# Patient Record
Sex: Male | Born: 1965 | Race: White | Hispanic: No | Marital: Married | Smoking: Never smoker
Health system: Southern US, Community
[De-identification: ages and names within clinical notes are randomized; demographics above are authoritative.]

## PROBLEM LIST (undated history)

## (undated) DIAGNOSIS — T7840XA Allergy, unspecified, initial encounter: Secondary | ICD-10-CM

## (undated) DIAGNOSIS — M109 Gout, unspecified: Secondary | ICD-10-CM

## (undated) DIAGNOSIS — E669 Obesity, unspecified: Secondary | ICD-10-CM

## (undated) DIAGNOSIS — K219 Gastro-esophageal reflux disease without esophagitis: Secondary | ICD-10-CM

## (undated) DIAGNOSIS — F419 Anxiety disorder, unspecified: Secondary | ICD-10-CM

## (undated) DIAGNOSIS — E119 Type 2 diabetes mellitus without complications: Secondary | ICD-10-CM

## (undated) DIAGNOSIS — M1990 Unspecified osteoarthritis, unspecified site: Secondary | ICD-10-CM

## (undated) DIAGNOSIS — E7800 Pure hypercholesterolemia, unspecified: Secondary | ICD-10-CM

## (undated) DIAGNOSIS — J189 Pneumonia, unspecified organism: Secondary | ICD-10-CM

## (undated) DIAGNOSIS — I10 Essential (primary) hypertension: Secondary | ICD-10-CM

## (undated) HISTORY — PX: SHOULDER ARTHROSCOPY: SHX128

## (undated) HISTORY — PX: CYSTECTOMY: SUR359

## (undated) HISTORY — DX: Type 2 diabetes mellitus without complications: E11.9

## (undated) HISTORY — PX: KNEE ARTHROSCOPY: SHX127

## (undated) HISTORY — PX: TONSILLECTOMY: SUR1361

---

## 2014-05-25 ENCOUNTER — Encounter (HOSPITAL_COMMUNITY): Payer: Self-pay | Admitting: *Deleted

## 2014-05-25 ENCOUNTER — Emergency Department (HOSPITAL_COMMUNITY)
Admission: EM | Admit: 2014-05-25 | Discharge: 2014-05-25 | Disposition: A | Discharge status: Home / Self Care | Payer: Self-pay | Attending: Emergency Medicine | Admitting: Emergency Medicine

## 2014-05-25 DIAGNOSIS — Z760 Encounter for issue of repeat prescription: Secondary | ICD-10-CM

## 2014-05-25 DIAGNOSIS — I10 Essential (primary) hypertension: Secondary | ICD-10-CM | POA: Insufficient documentation

## 2014-05-25 DIAGNOSIS — E669 Obesity, unspecified: Secondary | ICD-10-CM | POA: Insufficient documentation

## 2014-05-25 DIAGNOSIS — R030 Elevated blood-pressure reading, without diagnosis of hypertension: Secondary | ICD-10-CM

## 2014-05-25 DIAGNOSIS — Z7982 Long term (current) use of aspirin: Secondary | ICD-10-CM | POA: Insufficient documentation

## 2014-05-25 DIAGNOSIS — Z79899 Other long term (current) drug therapy: Secondary | ICD-10-CM | POA: Insufficient documentation

## 2014-05-25 DIAGNOSIS — IMO0001 Reserved for inherently not codable concepts without codable children: Secondary | ICD-10-CM

## 2014-05-25 HISTORY — DX: Pure hypercholesterolemia, unspecified: E78.00

## 2014-05-25 HISTORY — DX: Essential (primary) hypertension: I10

## 2014-05-25 HISTORY — DX: Obesity, unspecified: E66.9

## 2014-05-25 MED ORDER — LISINOPRIL 20 MG PO TABS: 20.0000 mg | ORAL_TABLET | Freq: Every day | ORAL | Status: DC

## 2014-05-25 MED ORDER — HYDROCHLOROTHIAZIDE 25 MG PO TABS: 25.0000 mg | ORAL_TABLET | Freq: Every day | ORAL | Status: DC

## 2014-05-25 NOTE — ED Notes (Signed)
Pt. Denies any dizziness or chest pain.  Pt. Denies any sob or n/v/d

## 2014-05-25 NOTE — ED Notes (Signed)
Pt reports recently moving here, does not have pcp. Has been out of bp meds x 3 weeks, checked bp at the store today and it was elevated. No acute distress noted at triage.

## 2014-05-25 NOTE — Discharge Instructions (Signed)
[facility].   Hypertension Hypertension, commonly called high blood pressure, is when the force of blood pumping through your arteries is too strong. Your arteries are the blood vessels that carry blood from your heart throughout your body. A blood pressure reading consists of a higher number over a lower number, such as 110/72. The higher number (systolic) is the pressure inside your arteries when your heart pumps. The lower number (diastolic) is the pressure inside your arteries when your heart relaxes. Ideally you want your blood pressure below 120/80. Hypertension forces your heart to work harder to pump blood. Your arteries may become narrow or stiff. Having hypertension puts you at risk for heart disease, stroke, and other problems.  RISK FACTORS Some risk factors for high blood pressure are controllable. Others are not.  Risk factors you cannot control include:   Race. You may be at higher risk if you are African American.  Age. Risk increases with age.  Gender. Men are at higher risk than women before age 50 years. After age 50, women are at higher risk than men. Risk factors you can control include:  Not getting enough exercise or physical activity.  Being overweight.  Getting too much fat, sugar, calories, or salt in your diet.  Drinking too much alcohol. SIGNS AND SYMPTOMS Hypertension does not usually cause signs or symptoms. Extremely high blood pressure (hypertensive crisis) may cause headache, anxiety, shortness of breath, and nosebleed. DIAGNOSIS  To check if you have hypertension, your health care provider will measure your blood pressure while you are seated, with your arm held at the level of your heart. It should be measured at least twice using the same arm. Certain conditions can cause a difference in blood pressure between your right and left arms. A blood pressure reading that is higher than normal on one occasion does not mean  that you need treatment. If one blood pressure reading is high, ask your health care provider about having it checked again. TREATMENT  Treating high blood pressure includes making lifestyle changes and possibly taking medicine. Living a healthy lifestyle can help lower high blood pressure. You may need to change some of your habits. Lifestyle changes may include:  Following the DASH diet. This diet is high in fruits, vegetables, and whole grains. It is low in salt, red meat, and added sugars.  Getting at least 2 hours of brisk physical activity every week.  Losing weight if necessary.  Not smoking.  Limiting alcoholic beverages.  Learning ways to reduce stress. If lifestyle changes are not enough to get your blood pressure under control, your health care provider may prescribe medicine. You may need to take more than one. Work closely with your health care provider to understand the risks and benefits. HOME CARE INSTRUCTIONS  Have your blood pressure rechecked as directed by your health care provider.   Take medicines only as directed by your health care provider. Follow the directions carefully. Blood pressure medicines must be taken as prescribed. The medicine does not work as well when you skip doses. Skipping doses also puts you at risk for problems.   Do not smoke.   Monitor your blood pressure at home as directed by your health care provider. SEEK MEDICAL CARE IF:   You think you are having a reaction to medicines taken.  You have recurrent headaches or feel dizzy.  You have swelling in your ankles.  You have trouble with your vision. SEEK IMMEDIATE  MEDICAL CARE IF:  You develop a severe headache or confusion.  You have unusual weakness, numbness, or feel faint.  You have severe chest or abdominal pain.  You vomit repeatedly.  You have trouble breathing. MAKE SURE YOU:   Understand these instructions.  Will watch your condition.  Will get help right  away if you are not doing well or get worse. Document Released: 01/04/2005 Document Revised: 05/21/2013 Document Reviewed: 10/27/2012 [facility] Patient Information 2015 [place]. This information is not intended to replace advice given to you by your health care provider. Make sure you discuss any questions you have with your health care provider.

## 2014-05-25 NOTE — ED Provider Notes (Signed)
CSN: [id number]     Arrival date & time 05/25/14  1628 History   First MD Initiated Contact with Patient 05/25/14 1813     Chief Complaint  Patient presents with  . Hypertension     (Consider location/radiation/quality/duration/timing/severity/associated sxs/prior Treatment) HPI Christopher Hurst is a 49 y.o. male who presents to the ED for BP check. He reports being out of his medication x 3 weeks and taking it today at home it was elevated. Patient states that he does not have insurance and has not been able to get in with a PCP to get him medications. He was taking HCTC 25 mg and Lisinopril 20 mg. He sates that he feels well just concerned because his BP today was 160/109.   Past Medical History  Diagnosis Date  . Hypertension   . Obesity   . High cholesterol    History reviewed. No pertinent past surgical history. History reviewed. No pertinent family history. History  Substance Use Topics  . Smoking status: Never Smoker   . Smokeless tobacco: Not on file  . Alcohol Use: No    Review of Systems Negative except as sated in HPI   Allergies  Review of patient's allergies indicates no known allergies.  Home Medications   Prior to Admission medications   Medication Sig Start Date End Date Taking? Authorizing Provider  aspirin 81 MG chewable tablet Chew 81 mg by mouth daily.   Yes Historical Provider, MD  Flaxseed, Linseed, (FLAXSEED OIL PO) Take 1 tablet by mouth daily.   Yes Historical Provider, MD  Multiple Vitamin (MULTIVITAMIN WITH MINERALS) TABS tablet Take 1 tablet by mouth daily.   Yes Historical Provider, MD  hydrochlorothiazide (HYDRODIURIL) 25 MG tablet Take 1 tablet (25 mg total) by mouth daily. 05/25/14   Hope Orlene OchM Neese, NP  lisinopril (PRINIVIL,ZESTRIL) 20 MG tablet Take 1 tablet (20 mg total) by mouth daily. 05/25/14   Hope Orlene OchM Neese, NP   BP 169/85 mmHg  Pulse 80  Temp(Src) 98.2 F (36.8 C) (Oral)  Resp 20  Ht 5\' 8"  (1.727 m)  Wt 264 lb 8 oz (119.976 kg)  BMI  40.23 kg/m2  SpO2 99% Physical Exam  Constitutional: He is oriented to person, place, and time. He appears well-developed and well-nourished. No distress.  HENT:  Head: Normocephalic and atraumatic.  Mouth/Throat: Uvula is midline, oropharynx is clear and moist and mucous membranes are normal.  Eyes: Conjunctivae and EOM are normal.  Neck: Normal range of motion. Neck supple.  Cardiovascular: Normal rate and regular rhythm.   Pulmonary/Chest: Effort normal. No respiratory distress. He has no wheezes. He has no rales.  Abdominal: Soft. There is no tenderness.  Musculoskeletal: Normal range of motion.  Neurological: He is alert and oriented to person, place, and time. He has normal strength. No cranial nerve deficit or sensory deficit. Coordination and gait normal.  No asymmetry.    Skin: Skin is warm and dry.  Psychiatric: He has a normal mood and affect. His behavior is normal.  Nursing note and vitals reviewed.   ED Course  Procedures (including critical care time) Labs Review Discussed with DR. Cook and will refill patient's medications and [facility]  MDM  49 y.o. male with elevated BP since being off his medication x 3 weeks. Stable for d/c with BP 169/85 mmHg  Pulse 80  Temp(Src) 98.2 F (36.8 C) (Oral)  Resp 20  Ht 5\' 8"  (1.727 m)  Wt 264 lb  8 oz (119.976 kg)  BMI 40.23 kg/m2  SpO2 99%  Discussed with the patient plan of care and all questioned fully answered. He will return if any problems arise.   Final diagnoses:  Medication refill  Elevated BP       Janne NapoleonHope M Neese, NP 05/25/14 1851  Donnetta HutchingBrian Cook, MD 05/26/14 [phone]

## 2014-07-05 ENCOUNTER — Encounter (HOSPITAL_COMMUNITY): Payer: Self-pay | Admitting: Family Medicine

## 2014-07-05 ENCOUNTER — Emergency Department (HOSPITAL_COMMUNITY)
Admission: EM | Admit: 2014-07-05 | Discharge: 2014-07-05 | Disposition: A | Discharge status: Home / Self Care | Payer: Self-pay | Attending: Emergency Medicine | Admitting: Emergency Medicine

## 2014-07-05 DIAGNOSIS — M79674 Pain in right toe(s): Secondary | ICD-10-CM | POA: Insufficient documentation

## 2014-07-05 DIAGNOSIS — Z79899 Other long term (current) drug therapy: Secondary | ICD-10-CM | POA: Insufficient documentation

## 2014-07-05 DIAGNOSIS — M10071 Idiopathic gout, right ankle and foot: Secondary | ICD-10-CM | POA: Insufficient documentation

## 2014-07-05 DIAGNOSIS — I10 Essential (primary) hypertension: Secondary | ICD-10-CM | POA: Insufficient documentation

## 2014-07-05 DIAGNOSIS — E669 Obesity, unspecified: Secondary | ICD-10-CM | POA: Insufficient documentation

## 2014-07-05 DIAGNOSIS — Z7982 Long term (current) use of aspirin: Secondary | ICD-10-CM | POA: Insufficient documentation

## 2014-07-05 HISTORY — DX: Gout, unspecified: M10.9

## 2014-07-05 MED ORDER — LISINOPRIL 20 MG PO TABS: 20.0000 mg | ORAL_TABLET | Freq: Every day | ORAL | Status: DC

## 2014-07-05 MED ORDER — INDOMETHACIN 25 MG PO CAPS
50.0000 mg | ORAL_CAPSULE | Freq: Once | ORAL | Status: AC
  Administered 2014-07-05: 50 mg via ORAL
  Filled 2014-07-05: qty 2

## 2014-07-05 MED ORDER — INDOMETHACIN 50 MG PO CAPS: ORAL_CAPSULE | ORAL | Status: DC

## 2014-07-05 NOTE — ED Provider Notes (Signed)
CSN: [id number]     Arrival date & time 07/05/14  1557 History  This chart was scribed for non-physician practitioner Oswaldo Conroy, PA-C working with Lorre Nick, MD by Lyndel Safe, ED Scribe. This patient was seen in room TR10C/TR10C and the patient's care was started at 4:37 PM.   Chief Complaint  Patient presents with  . Gout   The history is provided by the patient. No language interpreter was used.   HPI Comments: Christopher Hurst is a 49 y.o. male, with a PMhx of gout, HTN, obesity, and HLD, who presents to the Emergency Department complaining of sudden onset, constant, severe, left-sided inner foot pain that began 1 day ago. He notes a history of gout which he is prescribed indomethacin for but has since ran out of the medication. He notes that he went out to eat 3 nights ago and had mushrooms which he attributes his gout flare up to. He has a history of gout in his right foot. Pt denies a history of injury/trauma to his left foot, fevers, chills, numbness or weakness in lower extremities.   Pt notes he does not have insurance and is requesting a refill on his BP medicine. BP is 183/86 mgHg currently.   Past Medical History  Diagnosis Date  . Hypertension   . Obesity   . High cholesterol   . Gout    History reviewed. No pertinent past surgical history. History reviewed. No pertinent family history. History  Substance Use Topics  . Smoking status: Never Smoker   . Smokeless tobacco: Not on file  . Alcohol Use: No    Review of Systems  Constitutional: Negative for fever and chills.  Musculoskeletal: Positive for myalgias and arthralgias.  Neurological: Negative for weakness and numbness.      Allergies  Review of patient's allergies indicates no known allergies.  Home Medications   Prior to Admission medications   Medication Sig Start Date End Date Taking? Authorizing Provider  aspirin 81 MG chewable tablet Chew 81 mg by mouth daily.    Historical Provider, MD   Flaxseed, Linseed, (FLAXSEED OIL PO) Take 1 tablet by mouth daily.    Historical Provider, MD  hydrochlorothiazide (HYDRODIURIL) 25 MG tablet Take 1 tablet (25 mg total) by mouth daily. 05/25/14   Hope Orlene Och, NP  indomethacin (INDOCIN) 50 MG capsule Oral 50 mg 3 times daily until pain is tolerable then rapidly reduce dose to complete cessation of drug. 07/05/14   Oswaldo Conroy, PA-C  lisinopril (PRINIVIL,ZESTRIL) 20 MG tablet Take 1 tablet (20 mg total) by mouth daily. 05/25/14   Hope Orlene Och, NP  lisinopril (PRINIVIL,ZESTRIL) 20 MG tablet Take 1 tablet (20 mg total) by mouth daily. 07/05/14   Oswaldo Conroy, PA-C  Multiple Vitamin (MULTIVITAMIN WITH MINERALS) TABS tablet Take 1 tablet by mouth daily.    Historical Provider, MD   BP 183/86 mmHg  Pulse 86  Temp(Src) 98.1 F (36.7 C) (Oral)  Resp 18  SpO2 96% Physical Exam  Constitutional: He appears well-developed and well-nourished. No distress.  HENT:  Head: Normocephalic and atraumatic.  Eyes: Conjunctivae are normal. Right eye exhibits no discharge. Left eye exhibits no discharge.  Cardiovascular:  2+ DP and TP pulses on left foot.   Pulmonary/Chest: Effort normal. No respiratory distress.  Musculoskeletal:  Tenderness to left MTP, first without significant swelling or erythema. FROM. Pt ambulatory  Neurological: He is alert. Coordination normal.  5/5 strength in left lower extremity.  Sensation intact.   Skin: He is  not diaphoretic.  Psychiatric: He has a normal mood and affect. His behavior is normal.  Nursing note and vitals reviewed.   ED Course  Procedures  DIAGNOSTIC STUDIES: Oxygen Saturation is 96% on RA, adequate by my interpretation.    COORDINATION OF CARE: 4:43 PM Discussed treatment plan with pt. Pt acknowledges and agrees to plan.   Labs Review Labs Reviewed - No data to display  Imaging Review No results found.   EKG Interpretation None      MDM   Final diagnoses:  Great toe pain, right   Pt  with history of gout presenting with atraumatic toe pain. VSS. Neurovascularly intact. Tenderness to MTP without significant swelling erythema. FROM. I doubt septic arthritis. Pt likely with gout. Will treat with indomethacin. Refilled pt lisinopril. Stressed the importance of follow up with the wellness center. ED resources provided to establish care with PCP.   Discussed return precautions with patient. Patient verbalizes understanding and agrees with plan.  I personally performed the services described in this documentation, which was scribed in my presence. The recorded information has been reviewed and is accurate.   Oswaldo Conroy, PA-C 07/05/14 1654  Lorre Nick, MD 07/06/14 2129

## 2014-07-05 NOTE — ED Notes (Signed)
Pt here for gout in left foot/toes. sts hx of gout in same toes.

## 2014-07-05 NOTE — Discharge Instructions (Signed)
Return to the emergency room with worsening of symptoms, new symptoms or with symptoms that are concerning, especially fevers, redness, swelling, numbness, tingling, unable to move toes or walk. RICE: Rest, Ice (three cycles of 20 mins on, off at least twice a day), compression/brace, elevation.  Indomethacin as above. Please call your doctor for a followup appointment within 24-48 hours. When you talk to your doctor please let them know that you were seen in the emergency department and have them acquire all of your records so that they can discuss the findings with you and formulate a treatment plan to fully care for your new and ongoing problems.   Emergency Department Resource Guide 1) Find a Doctor and Pay Out of Pocket Although you won't have to find out who is covered by your insurance plan, it is a good idea to ask around and get recommendations. You will then need to call the office and see if the doctor you have chosen will accept you as a new patient and what types of options they offer for patients who are self-pay. Some doctors offer discounts or will set up payment plans for their patients who do not have insurance, but you will need to ask so you aren't surprised when you get to your appointment.  2) Contact Your Local Health Department Not all health departments have doctors that can see patients for sick visits, but many do, so it is worth a call to see if yours does. If you don't know where your local health department is, you can check in your phone book. The CDC also has a tool to help you locate your state's health department, and many state websites also have listings of all of their local health departments.  3) Find a Walk-in Clinic If your illness is not likely to be very severe or complicated, you may want to try a walk in clinic. These are popping up all over the country in pharmacies, drugstores, and shopping centers. They're usually staffed by nurse practitioners or  physician assistants that have been trained to treat common illnesses and complaints. They're usually fairly quick and inexpensive. However, if you have serious medical issues or chronic medical problems, these are probably not your best option.  No Primary Care Doctor: - Call Health Connect at  [phone] - they can help you locate a primary care doctor that  accepts your insurance, provides certain services, etc. - Physician Referral Service- [phone]  Chronic Pain Problems: Organization         Address  Phone   Notes  [facility]  [phone] Patients need to be referred by their primary care doctor.   Medication Assistance: Organization         Address  Phone   Notes  [facility] Medication [facility] [address] [place], [place] [address] [phone] --Must be a resident of [facility] -- Must have NO insurance coverage whatsoever (no Medicaid/ Medicare, etc.) -- The pt. MUST have a primary care doctor that directs their care regularly and follows them in the community   [facility]  [phone]   [employer]  [phone]    Agencies that provide inexpensive medical care: Organization         Address  Phone   Notes  [facility]  [phone]   [facility]    [phone]   [facility] [address] [place], [place]  [phone]   [facility] [address], [place] [phone]   Planned Parenthood    [phone]   [facility]    [phone]   [facility]  [address], [place] Phone:  [phone], Fax:  [phone] Hours of Operation:  9 am - 6 pm, M-F.  Also accepts Medicaid/Medicare and self-pay.  [facility]  [address], [place] Phone: [phone], Fax: [phone]. Hours of Operation:  8:30 am - 5:30 pm, M-F.   Also accepts Medicaid and self-pay.  [facility] [place] [address] [place] Phone: [phone]   [facility] [address] Natasha Bence [place], [place] [phone], Ext. [phone] Mondays & Thursdays: 7-9 AM.  First 15 patients are seen on a first come, first serve basis.    Medicaid-accepting [facility] Providers:  Organization         Address  Phone   Notes  [facility] [address], [place] [phone] Also accepts self-pay patients.  [facility] [address] Laurell Josephs [place], [place]  [phone]   [facility] [address], [place] [phone]   [facility] [address] [place] [phone]   Renaye Rakers [address], [place]   [phone] Only accepts [place] Access [place] patients after they have their name applied to their card.   Self-Pay (no insurance) in [facility]:  Organization         Address  Phone   Notes  Sickle Cell Patients, [facility] Internal Medicine [address] [place], [place] [phone]   [facility] [address] [place], [place] [phone]   [facility] [place]  [address], [place] [phone]   [facility]/Dr. Osei-Bonsu  [address], [place] or [address], [place] [phone] Phone number for both [place] and [place] locations is the same.  [facility] [address] [place] [phone]   [facility] [address] [place] or [address] [phone]   [facility] [address], [place] [phone], phone; [phone], fax Sees patients 1st and 3rd Saturday of every month.  Must not qualify for public or private insurance (i.e. Medicaid, Medicare, [place] Health Choice, Veterans'  Benefits)  Household income should be no more than 200% of the poverty level The clinic cannot treat you if you are pregnant or think you are pregnant  Sexually transmitted diseases are not treated at the clinic.    Dental Care: Organization         Address  Phone  Notes  [facility] Department of [facility] [address] [place], [place] [phone] Accepts children up to age 7 who are enrolled in [place] or [place] Health Choice; pregnant women with a Medicaid card; and children who have applied for Medicaid or [place] Health Choice, but were declined, whose parents can pay a reduced fee at time of service.  [facility] of [facility]  [address], [place] [phone] Accepts children up to age 59 who are enrolled in [place] or [place] Health Choice; pregnant women  with a Medicaid card; and children who have applied for Medicaid or [place] Health Choice, but were declined, whose parents can pay a reduced fee at time of service.  [facility]  [address], [place] [phone] Patients are seen by appointment only. Walk-ins are not accepted. [facility] will see patients 50 years of age and older. Monday - Tuesday (8am-5pm) Most Wednesdays (8:30-5pm) $30 per visit, cash only  [facility]  [address], [facility] [phone] Patients are seen by appointment only. Walk-ins are not accepted. [facility] will see patients 50 years of age and older. One Wednesday Evening (Monthly: Volunteer Based).  $30 per visit, cash only  [facility]  [phone] for adults; Children under age 684, call Graduate Pediatric Dentistry at [phone]. Children aged 50-14, please call [phone] to request a pediatric application.  Dental services are provided in all areas of dental care including fillings, crowns and bridges, complete and partial  dentures, implants, gum treatment, root canals, and extractions. Preventive care is also provided. Treatment is provided to both adults and children. Patients are selected via a lottery and there is often a waiting list.   [facility] [address], [place]  [phone] [email or web address]   [facility] [address], [place], [place] [phone], Ext. [phone] Second and Fourth Thursday of each month, opens at 6:30 AM; Clinic ends at 9 AM.  Patients are seen on a first-come first-served basis, and a limited number are seen during each clinic.   [facility]  [address], [place], [place] [phone]   Eligibility Requirements You must have lived in [place], [place], or [place] counties for at least the last three months.   You cannot be eligible for state or federal sponsored [place] Cityhealthcare insurance, including CIG[employer]eterans Administration, [place]icaid, or [employer]icare.   You generally cannot be eligible for healthcare insurance through your employer.    How to apply: Eligibility screenings are held every Tuesday and Wednesday afternoon from 1:00 pm until 4:00 pm. You do not need an appointment for the interview!  [facility] [address], [place], [place] [phone]   [facility]  [phone]   [facility]  [phone]   [facility]  [phone]    Behavioral Health Resources in the Community: Intensive Outpatient Programs Organization         Address  Phone  Notes  [facility] [address], [place], [place] [phone]   [facility] [address], [place], [place] [phone]   [facility] [address], [place], [place]  [phone]   [facility] [address],  [place], [place] [phone] or [phone]   Substance Abuse Resources Organization          Address  Phone  Notes  [facility]  [phone]   [facility]  [phone]   [facility]  [phone]   [facility]  [phone]   Residential & Outpatient Substance Abuse Program  [phone]   Psychological Services Organization         Address  Phone  Notes  [facility]  [phone]- [phone]   [facility]  [phone]- [phone]   [facility] [address], [place] [phone] or [phone]    Mobile Crisis Teams  Organization         Address  Phone  Notes  [facility], Mobile Crisis Care Unit  [phone]   [facility]  [address] [place], [place] [phone]   [facility] [address] [place] [phone]    Self-Help/Support Groups Organization         Address  Phone             Notes  Mental Health Assoc. of  - variety of support groups  336- I7437963 Call for more information  Narcotics Anonymous (NA), [facility] [address], [employer] [place]  2 meetings at this location   Statistician         Address  Phone  Notes  [facility] [address],    [place]  [phone]   [facility]  [address], [place] [address], [place], [place] [phone]   [facility] [address] [place], [place] [phone] Admissions: 8am-3pm M-F  [facility] [address],    [place], [place] [phone]   [facility] [address] [place], [place], [place] [phone]   [facility] [address],  [place], [place] [phone]   [facility] - Intensive Outpatient [address]ure[address], [place], [place] [phone]   [facility] ([facility]iction [facility] [address] [place]  [place], [place] [phone] or [phone]   [facility] ([facility]) [address], [place], [place]  [phone] Accepts Medicaid  [facility] [address]  [place] [phone] Substance Abuse/Addiction Treatment   [facility] Organization         Address  Phone  Notes  [facility]  [phone]   Angie Fava, PhD [address] Ervin Knack [place], [place]   [phone] or [phone]   [facility]   [address] [place], [place] [phone]   [facility] [address] [place], [place] [phone] Insurance/Medicaid/sponsorship through [facility] and Families [address]                                    [place], [place] [phone] Therapy/tele-psych/case  [facility] [address], [place] [phone]    Dr. Lolly Mustache  [phone]   [facility]  [employer] [facility] Dept. 1) [address] [place] 2) [address] [place] 3)  [address], [place] [phone]  [phone]   [facility] Child Abuse Hotline [phone] or [phone] (After Hours)

## 2014-07-07 ENCOUNTER — Emergency Department (HOSPITAL_COMMUNITY)
Admission: EM | Admit: 2014-07-07 | Discharge: 2014-07-07 | Disposition: A | Discharge status: Home / Self Care | Payer: Self-pay | Attending: Emergency Medicine | Admitting: Emergency Medicine

## 2014-07-07 ENCOUNTER — Encounter (HOSPITAL_COMMUNITY): Payer: Self-pay | Admitting: Nurse Practitioner

## 2014-07-07 DIAGNOSIS — Z79899 Other long term (current) drug therapy: Secondary | ICD-10-CM | POA: Insufficient documentation

## 2014-07-07 DIAGNOSIS — E669 Obesity, unspecified: Secondary | ICD-10-CM | POA: Insufficient documentation

## 2014-07-07 DIAGNOSIS — I10 Essential (primary) hypertension: Secondary | ICD-10-CM | POA: Insufficient documentation

## 2014-07-07 DIAGNOSIS — M10072 Idiopathic gout, left ankle and foot: Secondary | ICD-10-CM | POA: Insufficient documentation

## 2014-07-07 DIAGNOSIS — Z7982 Long term (current) use of aspirin: Secondary | ICD-10-CM | POA: Insufficient documentation

## 2014-07-07 MED ORDER — PREDNISONE 20 MG PO TABS
60.0000 mg | ORAL_TABLET | Freq: Every day | ORAL | Status: DC
  Administered 2014-07-07: 60 mg via ORAL
  Filled 2014-07-07: qty 3

## 2014-07-07 MED ORDER — HYDROCODONE-ACETAMINOPHEN 5-325 MG PO TABS: 2.0000 | ORAL_TABLET | ORAL | Status: DC | PRN

## 2014-07-07 MED ORDER — PREDNISONE 10 MG PO TABS: ORAL_TABLET | ORAL | Status: DC

## 2014-07-07 MED ORDER — LISINOPRIL 20 MG PO TABS
20.0000 mg | ORAL_TABLET | Freq: Once | ORAL | Status: AC
  Administered 2014-07-07: 20 mg via ORAL
  Filled 2014-07-07: qty 1

## 2014-07-07 MED ORDER — HYDROCODONE-ACETAMINOPHEN 5-325 MG PO TABS
2.0000 | ORAL_TABLET | Freq: Once | ORAL | Status: AC
  Administered 2014-07-07: 2 via ORAL
  Filled 2014-07-07: qty 2

## 2014-07-07 NOTE — ED Notes (Signed)
He reports he was here earlier this week and dx with gout, he was started on indomethacin but he continues to have pain. Pain is in L inner foot, states he can not even wear a sock or shoe due to pain.

## 2014-07-07 NOTE — ED Provider Notes (Signed)
CSN: [id number]     Arrival date & time 07/07/14  1155 History  This chart was scribed for Christopher Schaumann, PA-C, working with Azalia Bilis, MD by Chestine Spore, ED Scribe. The patient was seen in room TR11C/TR11C at 2:23 PM.    Chief Complaint  Patient presents with  . Foot Pain      The history is provided by the patient. No language interpreter was used.    HPI Comments: Christopher Hurst is a 49 y.o. male with a medical hx of gout and HTN who presents to the Emergency Department complaining of left foot pain onset earlier this week. He reports that this is his first flare up in 1 year. He is not able to put on a sock or shoe because of the pain. He thinks that eating mushrooms flared up his gout. He has now tried to change his diet in order to aid with the flare ups. Pt notes that he was Rx his HTN medications that he hasn't filled yet because he lost his debit card. He denies joint swelling, redness, and any other symptoms. He denies being a diabetic.   Past Medical History  Diagnosis Date  . Hypertension   . Obesity   . High cholesterol   . Gout    History reviewed. No pertinent past surgical history. History reviewed. No pertinent family history. History  Substance Use Topics  . Smoking status: Never Smoker   . Smokeless tobacco: Not on file  . Alcohol Use: No    Review of Systems  Musculoskeletal: Positive for arthralgias (right great toe). Negative for joint swelling.  Skin: Negative for color change.      Allergies  Review of patient's allergies indicates no known allergies.  Home Medications   Prior to Admission medications   Medication Sig Start Date End Date Taking? Authorizing Provider  aspirin 81 MG chewable tablet Chew 81 mg by mouth daily.    Historical Provider, MD  Flaxseed, Linseed, (FLAXSEED OIL PO) Take 1 tablet by mouth daily.    Historical Provider, MD  hydrochlorothiazide (HYDRODIURIL) 25 MG tablet Take 1 tablet (25 mg total) by mouth daily. 05/25/14    Hope Orlene Och, NP  indomethacin (INDOCIN) 50 MG capsule Oral 50 mg 3 times daily until pain is tolerable then rapidly reduce dose to complete cessation of drug. 07/05/14   Oswaldo Conroy, PA-C  lisinopril (PRINIVIL,ZESTRIL) 20 MG tablet Take 1 tablet (20 mg total) by mouth daily. 05/25/14   Hope Orlene Och, NP  lisinopril (PRINIVIL,ZESTRIL) 20 MG tablet Take 1 tablet (20 mg total) by mouth daily. 07/05/14   Oswaldo Conroy, PA-C  Multiple Vitamin (MULTIVITAMIN WITH MINERALS) TABS tablet Take 1 tablet by mouth daily.    Historical Provider, MD   BP 173/104 mmHg  Pulse 81  Temp(Src) 98.2 F (36.8 C) (Oral)  Resp 18  SpO2 98% Physical Exam  Constitutional: He is oriented to person, place, and time. He appears well-developed and well-nourished. No distress.  HENT:  Head: Normocephalic and atraumatic.  Eyes: EOM are normal.  Neck: Neck supple. No tracheal deviation present.  Cardiovascular: Normal rate.   Pulmonary/Chest: Effort normal. No respiratory distress.  Musculoskeletal: Normal range of motion.  Swelling 1st metatarsals. Good pulses.   Neurological: He is alert and oriented to person, place, and time.  Skin: Skin is warm and dry.  Psychiatric: He has a normal mood and affect. His behavior is normal.  Nursing note and vitals reviewed.   ED Course  Procedures (including  critical care time) DIAGNOSTIC STUDIES: Oxygen Saturation is 98% on RA, nl by my interpretation.    COORDINATION OF CARE: 2:27 PM-Discussed treatment plan which includes prednisone, pain medication with pt at bedside and pt agreed to plan.   Labs Review Labs Reviewed - No data to display  Imaging Review No results found.   EKG Interpretation None      MDM pt given hydrocodone, lisinopril and prednisone here.  He can not get his rx filled today   Final diagnoses:  Acute idiopathic gout of left foot    Meds ordered this encounter  Medications  . predniSONE (DELTASONE) tablet 60 mg    Sig:   .  HYDROcodone-acetaminophen (NORCO/VICODIN) 5-325 MG per tablet 2 tablet    Sig:   . lisinopril (PRINIVIL,ZESTRIL) tablet 20 mg    Sig:   . predniSONE (DELTASONE) 10 MG tablet    Sig: 5,4,3,2,1 taper    Dispense:  15 tablet    Refill:  0    Order Specific Question:  Supervising Provider    Answer:  MILLER, BRIAN [[id number]]  . HYDROcodone-acetaminophen (NORCO/VICODIN) 5-325 MG per tablet    Sig: Take 2 tablets by mouth every 4 (four) hours as needed.    Dispense:  16 tablet    Refill:  0    Order Specific Question:  Supervising Provider    Answer:  Eber Hong [[id number]]   I personally performed the services in this documentation, which was scribed in my presence.  The recorded information has been reviewed and considered.   Barnet Pall.   Lonia Skinner [place], PA-C 07/07/14 1656  Azalia Bilis, MD 07/07/14 [phone]

## 2014-07-07 NOTE — Discharge Instructions (Signed)

## 2014-07-07 NOTE — ED Notes (Signed)
Declined W/C at D/C and was escorted to lobby by RN. 

## 2014-07-16 ENCOUNTER — Emergency Department (HOSPITAL_COMMUNITY): Admission: EM | Admit: 2014-07-16 | Discharge: 2014-07-16 | Discharge status: Against Medical Advice | Payer: Self-pay

## 2014-08-09 ENCOUNTER — Emergency Department (HOSPITAL_COMMUNITY)
Admission: EM | Admit: 2014-08-09 | Discharge: 2014-08-09 | Disposition: A | Discharge status: Home / Self Care | Payer: No Typology Code available for payment source | Attending: Emergency Medicine | Admitting: Emergency Medicine

## 2014-08-09 ENCOUNTER — Encounter (HOSPITAL_COMMUNITY): Payer: Self-pay | Admitting: *Deleted

## 2014-08-09 ENCOUNTER — Emergency Department (HOSPITAL_COMMUNITY): Payer: No Typology Code available for payment source

## 2014-08-09 DIAGNOSIS — Z79899 Other long term (current) drug therapy: Secondary | ICD-10-CM | POA: Insufficient documentation

## 2014-08-09 DIAGNOSIS — Y9389 Activity, other specified: Secondary | ICD-10-CM | POA: Insufficient documentation

## 2014-08-09 DIAGNOSIS — Z7952 Long term (current) use of systemic steroids: Secondary | ICD-10-CM | POA: Diagnosis not present

## 2014-08-09 DIAGNOSIS — S8002XA Contusion of left knee, initial encounter: Secondary | ICD-10-CM | POA: Insufficient documentation

## 2014-08-09 DIAGNOSIS — S7002XA Contusion of left hip, initial encounter: Secondary | ICD-10-CM | POA: Diagnosis not present

## 2014-08-09 DIAGNOSIS — S80212A Abrasion, left knee, initial encounter: Secondary | ICD-10-CM | POA: Diagnosis not present

## 2014-08-09 DIAGNOSIS — I10 Essential (primary) hypertension: Secondary | ICD-10-CM | POA: Insufficient documentation

## 2014-08-09 DIAGNOSIS — Y998 Other external cause status: Secondary | ICD-10-CM | POA: Insufficient documentation

## 2014-08-09 DIAGNOSIS — H109 Unspecified conjunctivitis: Secondary | ICD-10-CM | POA: Diagnosis not present

## 2014-08-09 DIAGNOSIS — Z7982 Long term (current) use of aspirin: Secondary | ICD-10-CM | POA: Diagnosis not present

## 2014-08-09 DIAGNOSIS — S299XXA Unspecified injury of thorax, initial encounter: Secondary | ICD-10-CM | POA: Diagnosis not present

## 2014-08-09 DIAGNOSIS — Y9289 Other specified places as the place of occurrence of the external cause: Secondary | ICD-10-CM | POA: Diagnosis not present

## 2014-08-09 DIAGNOSIS — M109 Gout, unspecified: Secondary | ICD-10-CM | POA: Insufficient documentation

## 2014-08-09 DIAGNOSIS — S8992XA Unspecified injury of left lower leg, initial encounter: Secondary | ICD-10-CM | POA: Diagnosis present

## 2014-08-09 DIAGNOSIS — E669 Obesity, unspecified: Secondary | ICD-10-CM | POA: Insufficient documentation

## 2014-08-09 DIAGNOSIS — W19XXXA Unspecified fall, initial encounter: Secondary | ICD-10-CM

## 2014-08-09 MED ORDER — NAPROXEN 500 MG PO TABS: 500.0000 mg | ORAL_TABLET | Freq: Two times a day (BID) | ORAL | Status: DC

## 2014-08-09 MED ORDER — TOBRAMYCIN 0.3 % OP SOLN: 1.0000 [drp] | OPHTHALMIC | Status: DC

## 2014-08-09 MED ORDER — METHOCARBAMOL 500 MG PO TABS: 500.0000 mg | ORAL_TABLET | Freq: Two times a day (BID) | ORAL | Status: DC
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
## 2014-09-23 ENCOUNTER — Emergency Department (HOSPITAL_COMMUNITY)
Admission: EM | Admit: 2014-09-23 | Discharge: 2014-09-23 | Disposition: A | Discharge status: Home / Self Care | Payer: Self-pay | Attending: Emergency Medicine | Admitting: Emergency Medicine

## 2014-09-23 ENCOUNTER — Encounter (HOSPITAL_COMMUNITY): Payer: Self-pay | Admitting: Emergency Medicine

## 2014-09-23 DIAGNOSIS — Z79899 Other long term (current) drug therapy: Secondary | ICD-10-CM | POA: Insufficient documentation

## 2014-09-23 DIAGNOSIS — M109 Gout, unspecified: Secondary | ICD-10-CM | POA: Insufficient documentation

## 2014-09-23 DIAGNOSIS — Z791 Long term (current) use of non-steroidal anti-inflammatories (NSAID): Secondary | ICD-10-CM | POA: Insufficient documentation

## 2014-09-23 DIAGNOSIS — E119 Type 2 diabetes mellitus without complications: Secondary | ICD-10-CM | POA: Insufficient documentation

## 2014-09-23 DIAGNOSIS — Z7982 Long term (current) use of aspirin: Secondary | ICD-10-CM | POA: Insufficient documentation

## 2014-09-23 DIAGNOSIS — I10 Essential (primary) hypertension: Secondary | ICD-10-CM | POA: Insufficient documentation

## 2014-09-23 DIAGNOSIS — E780 Pure hypercholesterolemia: Secondary | ICD-10-CM | POA: Insufficient documentation

## 2014-09-23 MED ORDER — HYDROCHLOROTHIAZIDE 12.5 MG PO CAPS
25.0000 mg | ORAL_CAPSULE | Freq: Once | ORAL | Status: AC
  Administered 2014-09-23: 25 mg via ORAL
  Filled 2014-09-23: qty 2

## 2014-09-23 MED ORDER — LISINOPRIL 20 MG PO TABS
20.0000 mg | ORAL_TABLET | Freq: Once | ORAL | Status: AC
  Administered 2014-09-23: 20 mg via ORAL
  Filled 2014-09-23: qty 1

## 2014-09-23 MED ORDER — HYDROCHLOROTHIAZIDE 25 MG PO TABS: 25.0000 mg | ORAL_TABLET | Freq: Every day | ORAL | Status: DC

## 2014-09-23 MED ORDER — LISINOPRIL 20 MG PO TABS: 20.0000 mg | ORAL_TABLET | Freq: Every day | ORAL | Status: DC

## 2014-09-23 NOTE — ED Notes (Signed)
Pt is ambulatory and a&ox4, questions, concerns denied r/t dc. Pt seen by cm prior to dc

## 2014-09-23 NOTE — Progress Notes (Addendum)
EDCM spoke to patient at bedside. Patient noted to have visited the ED six times in six  Months. Patient confirms he does not have a pcp or insurance living in [place].  [facility] provided patient with pamphlet to [facility], informed patient of services there .  EDCM also provided patient with list of pcps who accept self pay patients, list of discount pharmacies and websites needymeds.org and GoodRX.com for medication assistance, phone number to inquire about the orange card, phone number to inquire about Mediciad, phone number to inquire about the Affordable Care Act, financial resources in the community such as local churches, [employer], urban ministries, and dental assistance for uninsured patients.  Patient thankful for resources.  No further EDCM needs at this time.  Patient agreeable to referral to [facility] for orange card.  [facility] referral placed.

## 2014-09-23 NOTE — Discharge Instructions (Signed)

## 2014-09-23 NOTE — ED Notes (Signed)
Pt from home via [facility]. Pt has been off his bp meds for about 2 weeks. Pt has been dizzy and has had a headache for about 1 week and has taken otc medications for this. Pt was ambulatory at the scene.

## 2014-09-23 NOTE — ED Notes (Signed)
Bed: WA01 Expected date:  Expected time:  Means of arrival:  Comments: EMS-hypertension 

## 2014-09-23 NOTE — ED Notes (Signed)
Waiting on lisinopril from pharmacy before discharging pt

## 2014-09-23 NOTE — ED Provider Notes (Signed)
CSN: [id number]     Arrival date & time 09/23/14  1910 History   First MD Initiated Contact with Patient 09/23/14 1914     Chief Complaint  Patient presents with  . Hypertension    pt has not taken his bp medication for 2 weeks     (Consider location/radiation/quality/duration/timing/severity/associated sxs/prior Treatment) Patient is a 49 y.o. male presenting with dizziness. The history is provided by the patient.  Dizziness Quality:  Lightheadedness Severity:  Mild Onset quality:  Gradual Duration:  1 day Timing:  Intermittent Progression:  Waxing and waning Chronicity:  New Context: bending over   Context: not with loss of consciousness   Relieved by:  Nothing Worsened by:  Nothing Ineffective treatments:  None tried Associated symptoms: no headaches, no shortness of breath, no vision changes and no weakness   Risk factors comment:  Non-compliance   Past Medical History  Diagnosis Date  . Hypertension   . Obesity   . High cholesterol   . Gout    History reviewed. No pertinent past surgical history. No family history on file. Social History  Substance Use Topics  . Smoking status: Never Smoker   . Smokeless tobacco: None  . Alcohol Use: No    Review of Systems  Respiratory: Negative for shortness of breath.   Neurological: Positive for dizziness. Negative for weakness and headaches.  All other systems reviewed and are negative.     Allergies  Review of patient's allergies indicates no known allergies.  Home Medications   Prior to Admission medications   Medication Sig Start Date End Date Taking? Authorizing Provider  aspirin 81 MG chewable tablet Chew 81 mg by mouth daily.    Historical Provider, MD  Flaxseed, Linseed, (FLAXSEED OIL PO) Take 1 tablet by mouth daily.    Historical Provider, MD  hydrochlorothiazide (HYDRODIURIL) 25 MG tablet Take 1 tablet (25 mg total) by mouth daily. 05/25/14   Hope Orlene Och, NP  HYDROcodone-acetaminophen (NORCO/VICODIN)  5-325 MG per tablet Take 2 tablets by mouth every 4 (four) hours as needed. 07/07/14   Elson Areas, PA-C  indomethacin (INDOCIN) 50 MG capsule Oral 50 mg 3 times daily until pain is tolerable then rapidly reduce dose to complete cessation of drug. 07/05/14   Oswaldo Conroy, PA-C  lisinopril (PRINIVIL,ZESTRIL) 20 MG tablet Take 1 tablet (20 mg total) by mouth daily. 05/25/14   Hope Orlene Och, NP  lisinopril (PRINIVIL,ZESTRIL) 20 MG tablet Take 1 tablet (20 mg total) by mouth daily. 07/05/14   Oswaldo Conroy, PA-C  methocarbamol (ROBAXIN) 500 MG tablet Take 1 tablet (500 mg total) by mouth 2 (two) times daily. 08/09/14   Hope Orlene Och, NP  Multiple Vitamin (MULTIVITAMIN WITH MINERALS) TABS tablet Take 1 tablet by mouth daily.    Historical Provider, MD  naproxen (NAPROSYN) 500 MG tablet Take 1 tablet (500 mg total) by mouth 2 (two) times daily. 08/09/14   Hope Orlene Och, NP  predniSONE (DELTASONE) 10 MG tablet 5,4,3,2,1 taper 07/07/14   Elson Areas, PA-C  tobramycin (TOBREX) 0.3 % ophthalmic solution Place 1 drop into the left eye every 4 (four) hours. 08/09/14   Hope Orlene Och, NP   BP 194/89 mmHg  Pulse 84  Temp(Src) 97.7 F (36.5 C) (Oral)  Resp 20  SpO2 97% Physical Exam  Constitutional: He is oriented to person, place, and time. He appears well-developed and well-nourished. No distress.  HENT:  Head: Normocephalic and atraumatic.  Eyes: Conjunctivae are normal.  Neck: Neck supple.  No tracheal deviation present.  Cardiovascular: Normal rate, regular rhythm and normal heart sounds.   Pulmonary/Chest: Effort normal and breath sounds normal. No respiratory distress.  Abdominal: Soft. He exhibits no distension.  Neurological: He is alert and oriented to person, place, and time. He has normal strength. No cranial nerve deficit. GCS eye subscore is 4. GCS verbal subscore is 5. GCS motor subscore is 6.  Normal finger to nose and heel to shin testing  Skin: Skin is warm and dry.  Psychiatric: He has a  normal mood and affect.    ED Course  Procedures (including critical care time) Labs Review Labs Reviewed - No data to display  Imaging Review No results found. I have personally reviewed and evaluated these images and lab results as part of my medical decision-making.   EKG Interpretation None      MDM   Final diagnoses:  Chronic hypertension    50 y.o. male presents after feeling lightheaded earlier today and checking his blood pressure finding it to be high. No neurologic deficits, normal coordination, no signs of hypertensive urgency on exam. No chest pain, no shortness of breath, no headaches. He had been on 2 agents for BP but has been lost to follow up d/t insurance status. Given his home meds by request and provided a short term prescription for hypertension control. No labs or imaging indicated currently. Plan to re-establish PCP as needed and return precautions discussed for worsening or new concerning symptoms.     Lyndal Pulley, MD 09/24/14 [phone]

## 2016-02-05 ENCOUNTER — Emergency Department (HOSPITAL_COMMUNITY)
Admission: EM | Admit: 2016-02-05 | Discharge: 2016-02-05 | Disposition: A | Discharge status: Home / Self Care | Payer: Self-pay | Attending: Emergency Medicine | Admitting: Emergency Medicine

## 2016-02-05 ENCOUNTER — Encounter (HOSPITAL_COMMUNITY): Payer: Self-pay | Admitting: Emergency Medicine

## 2016-02-05 DIAGNOSIS — I10 Essential (primary) hypertension: Secondary | ICD-10-CM | POA: Insufficient documentation

## 2016-02-05 DIAGNOSIS — Z79899 Other long term (current) drug therapy: Secondary | ICD-10-CM | POA: Insufficient documentation

## 2016-02-05 DIAGNOSIS — Z7982 Long term (current) use of aspirin: Secondary | ICD-10-CM | POA: Insufficient documentation

## 2016-02-05 DIAGNOSIS — Z760 Encounter for issue of repeat prescription: Secondary | ICD-10-CM | POA: Insufficient documentation

## 2016-02-05 MED ORDER — LISINOPRIL-HYDROCHLOROTHIAZIDE 10-12.5 MG PO TABS: 1.0000 | ORAL_TABLET | Freq: Every day | ORAL | 0 refills | Status: DC

## 2016-02-05 NOTE — Discharge Instructions (Signed)
Read the information below.  You may return to the Emergency Department at any time for worsening condition or any new symptoms that concern you. °

## 2016-02-05 NOTE — ED Triage Notes (Signed)
Pt here for refill on BP meds; pt sts no insurance and has been out x 3 days

## 2016-02-05 NOTE — ED Provider Notes (Signed)
[facility]-EMERGENCY DEPT Provider Note   CSN: [id number] Arrival date & time: 02/05/16  1210     History   Chief Complaint Chief Complaint  Patient presents with  . Medication Refill    HPI Christopher Hurst is a 51 y.o. male.  HPI   Pt with hx HTN, HLD presents requesting refill of his blood pressure medication.  He has been out 3 days.  He has no symptoms.  Does not have health insurance.  Always has Rx filled at emergency departments.  Denies CP, SOB, bad headaches.  States he feels fine.    Past Medical History:  Diagnosis Date  . Gout   . High cholesterol   . Hypertension   . Obesity     There are no active problems to display for this patient.   History reviewed. No pertinent surgical history.     Home Medications    Prior to Admission medications   Medication Sig Start Date End Date Taking? Authorizing Provider  aspirin 81 MG chewable tablet Chew 81 mg by mouth daily.    Historical Provider, MD  Flaxseed, Linseed, (FLAXSEED OIL PO) Take 1 tablet by mouth daily.    Historical Provider, MD  hydrochlorothiazide (HYDRODIURIL) 25 MG tablet Take 1 tablet (25 mg total) by mouth daily. 09/23/14   Lyndal Pulleyaniel Knott, MD  HYDROcodone-acetaminophen (NORCO/VICODIN) 5-325 MG per tablet Take 2 tablets by mouth every 4 (four) hours as needed. 07/07/14   Elson AreasLeslie K Sofia, PA-C  indomethacin (INDOCIN) 50 MG capsule Oral 50 mg 3 times daily until pain is tolerable then rapidly reduce dose to complete cessation of drug. 07/05/14   Oswaldo ConroyVictoria Creech, PA-C  lisinopril (PRINIVIL,ZESTRIL) 20 MG tablet Take 1 tablet (20 mg total) by mouth daily. 09/23/14   Lyndal Pulleyaniel Knott, MD  lisinopril-hydrochlorothiazide (PRINZIDE,ZESTORETIC) 10-12.5 MG tablet Take 1 tablet by mouth daily. 02/05/16   Trixie DredgeEmily Chevelle Coulson, PA-C  methocarbamol (ROBAXIN) 500 MG tablet Take 1 tablet (500 mg total) by mouth 2 (two) times daily. 08/09/14   Hope Orlene OchM Neese, NP  Multiple Vitamin (MULTIVITAMIN WITH MINERALS) TABS tablet Take 1 tablet by mouth  daily.    Historical Provider, MD  naproxen (NAPROSYN) 500 MG tablet Take 1 tablet (500 mg total) by mouth 2 (two) times daily. 08/09/14   Hope Orlene OchM Neese, NP  predniSONE (DELTASONE) 10 MG tablet 5,4,3,2,1 taper 07/07/14   Elson AreasLeslie K Sofia, PA-C  tobramycin (TOBREX) 0.3 % ophthalmic solution Place 1 drop into the left eye every 4 (four) hours. 08/09/14   Hope Orlene OchM Neese, NP    Family History History reviewed. No pertinent family history.  Social History Social History  Substance Use Topics  . Smoking status: Never Smoker  . Smokeless tobacco: Not on file  . Alcohol use No     Allergies   Patient has no known allergies.   Review of Systems Review of Systems  All other systems reviewed and are negative.    Physical Exam Updated Vital Signs BP 129/79 (BP Location: Right Arm)   Pulse 99   Temp 98 F (36.7 C) (Oral)   Resp 18   SpO2 98%   Physical Exam  Constitutional: He appears well-developed and well-nourished. No distress.  HENT:  Head: Normocephalic and atraumatic.  Neck: Normal range of motion. Neck supple.  Cardiovascular: Normal rate, regular rhythm and normal heart sounds.   Pulmonary/Chest: Effort normal and breath sounds normal. No respiratory distress. He has no wheezes. He has no rales.  Neurological: He is alert.  Speech is normal  Skin: He is not diaphoretic.  Psychiatric: He has a normal mood and affect.  Nursing note and vitals reviewed.    ED Treatments / Results  Labs (all labs ordered are listed, but only abnormal results are displayed) Labs Reviewed - No data to display  EKG  EKG Interpretation None       Radiology No results found.  Procedures Procedures (including critical care time)  Medications Ordered in ED Medications - No data to display   Initial Impression / Assessment and Plan / ED Course  I have reviewed the triage vital signs and the nursing notes.  Pertinent labs & imaging results that were available during my care of the  patient were reviewed by me and considered in my medical decision making (see chart for details).     Afebrile, nontoxic patient with request for antihypertensive medication refill.  He has no symptoms.   D/C home with resource guide, 1 month prescription.  Discussed result, findings, treatment, and follow up  with patient.  Pt given return precautions.  Pt verbalizes understanding and agrees with plan.       Final Clinical Impressions(s) / ED Diagnoses   Final diagnoses:  Medication refill    New Prescriptions New Prescriptions   LISINOPRIL-HYDROCHLOROTHIAZIDE (PRINZIDE,ZESTORETIC) 10-12.5 MG TABLET    Take 1 tablet by mouth daily.     Trixie Dredge, PA-C 02/05/16 1244    Gwyneth Sprout, MD 02/05/16 2040

## 2016-02-05 NOTE — ED Notes (Signed)
Pt ambulatory at DC. Verbalized understanding of medication refill and resources printed and given for [place].

## 2016-06-15 ENCOUNTER — Encounter (HOSPITAL_COMMUNITY): Payer: Self-pay

## 2016-06-15 ENCOUNTER — Emergency Department (HOSPITAL_COMMUNITY)
Admission: EM | Admit: 2016-06-15 | Discharge: 2016-06-15 | Disposition: A | Discharge status: Home / Self Care | Payer: Self-pay | Attending: Emergency Medicine | Admitting: Emergency Medicine

## 2016-06-15 DIAGNOSIS — Z7982 Long term (current) use of aspirin: Secondary | ICD-10-CM | POA: Insufficient documentation

## 2016-06-15 DIAGNOSIS — Z79899 Other long term (current) drug therapy: Secondary | ICD-10-CM | POA: Insufficient documentation

## 2016-06-15 DIAGNOSIS — Z760 Encounter for issue of repeat prescription: Secondary | ICD-10-CM | POA: Insufficient documentation

## 2016-06-15 DIAGNOSIS — I10 Essential (primary) hypertension: Secondary | ICD-10-CM | POA: Insufficient documentation

## 2016-06-15 HISTORY — DX: Gastro-esophageal reflux disease without esophagitis: K21.9

## 2016-06-15 MED ORDER — LISINOPRIL-HYDROCHLOROTHIAZIDE 10-12.5 MG PO TABS: 1.0000 | ORAL_TABLET | Freq: Every day | ORAL | 0 refills | Status: DC

## 2016-06-15 NOTE — ED Provider Notes (Signed)
[facility]PT Provider Note   CSN: [id number] Arrival date & time: 06/15/16  4098  By signing my name below, I, Deland Pretty, attest that this documentation has been prepared under the direction and in the presence of Sharilyn Sites, PA-C Electronically Signed: Deland Pretty, ED Scribe. 06/15/16. 10:50 AM.  History   Chief Complaint Chief Complaint  Patient presents with  . Medication Refill   The history is provided by the patient. No language interpreter was used.    HPI Comments: Christopher Hurst is a 51 y.o. male , with a PMHx of HTN, who presents to the Emergency Department requesting medication refill for his otherwise well controlled HTN. He states that he can feel his blood pressure rising because he has associated mild headaches from time to time.  No chest pain or SOB. He reports that he is unable to see his PCP before mid-June 2018, and states that he was refused treatment at [facility] for this issue. 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 The HTN medication that he requests currently does not cause any symptoms other than a cough which he was told from lisinopril.  States does not bother him enough to change medications.  Past Medical History:  Diagnosis Date  . Acid reflux   . Gout   . High cholesterol   . Hypertension   . Obesity     There are no active problems to display for this patient.   Past Surgical History:  Procedure Laterality Date  . CYSTECTOMY     testicle   . KNEE ARTHROSCOPY    . SHOULDER ARTHROSCOPY         Home Medications    Prior to Admission medications   Medication Sig Start Date End Date Taking? Authorizing Provider  aspirin 81 MG chewable tablet Chew 81 mg by mouth daily.    [provider]  Flaxseed, Linseed, (FLAXSEED OIL PO) Take 1 tablet by mouth daily.    [provider]    hydrochlorothiazide (HYDRODIURIL) 25 MG tablet Take 1 tablet (25 mg total) by mouth daily. 09/23/14   Lyndal Pulley, MD  HYDROcodone-acetaminophen (NORCO/VICODIN) 5-325 MG per tablet Take 2 tablets by mouth every 4 (four) hours as needed. 07/07/14   Elson Areas, PA-C  indomethacin (INDOCIN) 50 MG capsule Oral 50 mg 3 times daily until pain is tolerable then rapidly reduce dose to complete cessation of drug. 07/05/14   Oswaldo Conroy, PA-C  lisinopril (PRINIVIL,ZESTRIL) 20 MG tablet Take 1 tablet (20 mg total) by mouth daily. 09/23/14   Lyndal Pulley, MD  lisinopril-hydrochlorothiazide (PRINZIDE,ZESTORETIC) 10-12.5 MG tablet Take 1 tablet by mouth daily. 02/05/16   Trixie Dredge, PA-C  methocarbamol (ROBAXIN) 500 MG tablet Take 1 tablet (500 mg total) by mouth 2 (two) times daily. 08/09/14   Janne Napoleon, NP  Multiple Vitamin (MULTIVITAMIN WITH MINERALS) TABS tablet Take 1 tablet by mouth daily.    [provider]  naproxen (NAPROSYN) 500 MG tablet Take 1 tablet (500 mg total) by mouth 2 (two) times daily. 08/09/14   Janne Napoleon, NP  predniSONE (DELTASONE) 10 MG tablet 5,4,3,2,1 taper 07/07/14   Cheron Schaumann K, PA-C  tobramycin (TOBREX) 0.3 % ophthalmic solution Place 1 drop into the left eye every 4 (four) hours. 08/09/14   Janne Napoleon, NP    Family History No family history on file.  Social History  Social History  Substance Use Topics  . Smoking status: Never Smoker  . Smokeless tobacco: Never Used  . Alcohol use No     Allergies   Patient has no known allergies.   Review of Systems Review of Systems  Respiratory: Positive for cough.   Neurological: Positive for headaches.  All other systems reviewed and are negative.    Physical Exam Updated Vital Signs BP (!) 171/94 (BP Location: Left Arm)   Pulse 67   Temp 98.1 F (36.7 C) (Oral)   Resp 18   Ht 5\' 8"  (1.727 m)   Wt 250 lb (113.4 kg)   SpO2 97%   BMI 38.01 kg/m   Physical Exam  Constitutional: He is  oriented to person, place, and time. He appears well-developed and well-nourished.  HENT:  Head: Normocephalic and atraumatic.  Mouth/Throat: Oropharynx is clear and moist.  Eyes: Conjunctivae and EOM are normal. Pupils are equal, round, and reactive to light.  Neck: Normal range of motion.  Cardiovascular: Normal rate, regular rhythm and normal heart sounds.   Pulmonary/Chest: Effort normal and breath sounds normal.  Abdominal: Soft. Bowel sounds are normal.  Musculoskeletal: Normal range of motion.  Neurological: He is alert and oriented to person, place, and time.  Skin: Skin is warm and dry.  Psychiatric: He has a normal mood and affect.  Nursing note and vitals reviewed.    ED Treatments / Results  Labs (all labs ordered are listed, but only abnormal results are displayed) Labs Reviewed - No data to display  EKG  EKG Interpretation None       Radiology No results found.  Procedures Procedures (including critical care time)  Medications Ordered in ED Medications - No data to display   Initial Impression / Assessment and Plan / ED Course  I have reviewed the triage vital signs and the nursing notes.  Pertinent labs & imaging results that were available during my care of the patient were reviewed by me and considered in my medical decision making (see chart for details).  51 year old male here requesting medication refill. Has now for a few days now. [facility], however was refused care. States he has upcoming appointment with PCP in one month once his insurance is effective, but needs refill until he can get in to be seen. Reports some intermittent mild headaches, none currently. Denies any chest pain or shortness of breath. His blood pressure is mildly elevated here, but I do not suspect any acute end organ damage. I refilled his medications for 1 month. Can follow-up with his PCP afterwards for future refills.  Discussed plan with  patient, he acknowledged understanding and agreed with plan of care.  Return precautions given for new or worsening symptoms.  Final Clinical Impressions(s) / ED Diagnoses   Final diagnoses:  Medication refill    New Prescriptions New Prescriptions   LISINOPRIL-HYDROCHLOROTHIAZIDE (ZESTORETIC) 10-12.5 MG TABLET    Take 1 tablet by mouth daily.     Garlon HatchetSanders, Lisa M, PA-C 06/15/16 1053    Benjiman CorePickering, Nathan, MD 06/15/16 [phone]

## 2016-06-15 NOTE — ED Triage Notes (Signed)
Per Pt, Pt is coming from home with request to get BP meds refilled. Pt Reports having insurance kicking in by 07/15/16, and he is waiting to set up PCP. Denies Chest pain and neuro changes. Reports slight headache.

## 2016-06-25 ENCOUNTER — Emergency Department (HOSPITAL_COMMUNITY)
Admission: EM | Admit: 2016-06-25 | Discharge: 2016-06-25 | Disposition: A | Discharge status: Home / Self Care | Payer: Self-pay | Attending: Emergency Medicine | Admitting: Emergency Medicine

## 2016-06-25 ENCOUNTER — Encounter (HOSPITAL_COMMUNITY): Payer: Self-pay | Admitting: *Deleted

## 2016-06-25 DIAGNOSIS — K2970 Gastritis, unspecified, without bleeding: Secondary | ICD-10-CM | POA: Insufficient documentation

## 2016-06-25 DIAGNOSIS — K219 Gastro-esophageal reflux disease without esophagitis: Secondary | ICD-10-CM | POA: Insufficient documentation

## 2016-06-25 DIAGNOSIS — I10 Essential (primary) hypertension: Secondary | ICD-10-CM | POA: Insufficient documentation

## 2016-06-25 DIAGNOSIS — Z79899 Other long term (current) drug therapy: Secondary | ICD-10-CM | POA: Insufficient documentation

## 2016-06-25 LAB — CBC
HCT: 39.9 % (ref 39.0–52.0)
HEMOGLOBIN: 12.7 g/dL — AB (ref 13.0–17.0)
MCH: 27.5 pg (ref 26.0–34.0)
MCHC: 31.8 g/dL (ref 30.0–36.0)
MCV: 86.6 fL (ref 78.0–100.0)
Platelets: 245 10*3/uL (ref 150–400)
RBC: 4.61 MIL/uL (ref 4.22–5.81)
RDW: 14 % (ref 11.5–15.5)
WBC: 6.5 10*3/uL (ref 4.0–10.5)

## 2016-06-25 LAB — COMPREHENSIVE METABOLIC PANEL
ALBUMIN: 3.8 g/dL (ref 3.5–5.0)
ALT: 25 U/L (ref 17–63)
AST: 22 U/L (ref 15–41)
Alkaline Phosphatase: 86 U/L (ref 38–126)
Anion gap: 7 (ref 5–15)
BILIRUBIN TOTAL: 0.5 mg/dL (ref 0.3–1.2)
BUN: 10 mg/dL (ref 6–20)
CO2: 30 mmol/L (ref 22–32)
Calcium: 9.3 mg/dL (ref 8.9–10.3)
Chloride: 102 mmol/L (ref 101–111)
Creatinine, Ser: 0.78 mg/dL (ref 0.61–1.24)
GFR calc Af Amer: >60 mL/min (ref >60)
GFR calc non Af Amer: >60 mL/min (ref >60)
GLUCOSE: 117 mg/dL — AB (ref 65–99)
POTASSIUM: 3.8 mmol/L (ref 3.5–5.1)
Sodium: 139 mmol/L (ref 135–145)
TOTAL PROTEIN: 6.6 g/dL (ref 6.5–8.1)

## 2016-06-25 LAB — URINALYSIS, ROUTINE W REFLEX MICROSCOPIC
BILIRUBIN URINE: NEGATIVE
Glucose, UA: NEGATIVE mg/dL
Hgb urine dipstick: NEGATIVE
Ketones, ur: NEGATIVE mg/dL
Leukocytes, UA: NEGATIVE
NITRITE: NEGATIVE
PH: 6 (ref 5.0–8.0)
Protein, ur: NEGATIVE mg/dL
SPECIFIC GRAVITY, URINE: 1.019 (ref 1.005–1.030)

## 2016-06-25 LAB — LIPASE, BLOOD: Lipase: 35 U/L (ref 11–51)

## 2016-06-25 MED ORDER — OMEPRAZOLE 20 MG PO CPDR: 20.0000 mg | DELAYED_RELEASE_CAPSULE | Freq: Every day | ORAL | 0 refills | Status: DC

## 2016-06-25 MED ORDER — ONDANSETRON 4 MG PO TBDP
4.0000 mg | ORAL_TABLET | Freq: Once | ORAL | Status: AC
  Administered 2016-06-25: 4 mg via ORAL
  Filled 2016-06-25: qty 1

## 2016-06-25 MED ORDER — SUCRALFATE 1 GM/10ML PO SUSP: 1.0000 g | Freq: Four times a day (QID) | ORAL | 0 refills | Status: DC

## 2016-06-25 MED ORDER — GI COCKTAIL ~~LOC~~
30.0000 mL | Freq: Once | ORAL | Status: AC
  Administered 2016-06-25: 30 mL via ORAL
  Filled 2016-06-25: qty 30

## 2016-06-25 NOTE — ED Triage Notes (Signed)
Pt reports abd pain onset x 2 wks with pain onset LUQ, pt reports pain now in bil lower abd pain, pt denies diarrhea, pt reports x 1 episode of emesis in the last 24 hrs, pt reports lack of appetite, pt reports worsening nausea after eating, A&O x4

## 2016-06-25 NOTE — ED Provider Notes (Signed)
[facility] Provider Note   CSN: [id number] Arrival date & time: 06/25/16  1722     History   Chief Complaint Chief Complaint  Patient presents with  . Emesis  . Abdominal Pain    HPI Christopher Hurst is a 51 y.o. male.  Patient reports that he has not felt well for 2 weeks.  He reports that he has LLQ abdominal pain.  He reports vomiting, bloating.  He reports normal BM this am.  No blood or melena.  Denies diarrhea, constipation.  He reports vomiting several times daily.  He reports he vomits after each meal.  He notes he feels hunger but is not able to keep food down.  He reports 1 episode of blood streaked vomit.  He reports h/o GERD but notes that he no longer takes PPI.  He reports he has not taken BP meds today.   No consumption of well water or unfiltered water.  No recent travel, undercooked foods, petting zoos.  No other sick contacts at home.  No fevers, chills, dysuria, hematuria, penile discharge or masses.  He reports he is able to keep fluids down.     Past Medical History:  Diagnosis Date  . Acid reflux   . Gout   . High cholesterol   . Hypertension   . Obesity     There are no active problems to display for this patient.   Past Surgical History:  Procedure Laterality Date  . CYSTECTOMY     testicle   . KNEE ARTHROSCOPY    . SHOULDER ARTHROSCOPY        Home Medications    Prior to Admission medications   Medication Sig Start Date End Date Taking? Authorizing Provider  Flaxseed, Linseed, (FLAXSEED OIL PO) Take 1 tablet by mouth daily.   Yes [provider]  lisinopril-hydrochlorothiazide (ZESTORETIC) 10-12.5 MG tablet Take 1 tablet by mouth daily. 06/15/16  Yes Garlon HatchetSanders, Lisa M, PA-C  Multiple Vitamin (MULTIVITAMIN WITH MINERALS) TABS tablet Take 1 tablet by mouth daily.   Yes [provider]  hydrochlorothiazide (HYDRODIURIL) 25 MG tablet Take 1 tablet (25 mg total) by mouth daily. Patient not taking: Reported on 06/25/2016  09/23/14   Lyndal PulleyKnott, Daniel, MD  HYDROcodone-acetaminophen (NORCO/VICODIN) 5-325 MG per tablet Take 2 tablets by mouth every 4 (four) hours as needed. Patient not taking: Reported on 06/25/2016 07/07/14   Elson AreasSofia, Leslie K, PA-C  indomethacin (INDOCIN) 50 MG capsule Oral 50 mg 3 times daily until pain is tolerable then rapidly reduce dose to complete cessation of drug. Patient not taking: Reported on 06/25/2016 07/05/14   Oswaldo Conroyreech, Victoria, PA-C  lisinopril (PRINIVIL,ZESTRIL) 20 MG tablet Take 1 tablet (20 mg total) by mouth daily. Patient not taking: Reported on 06/25/2016 09/23/14   Lyndal PulleyKnott, Daniel, MD  methocarbamol (ROBAXIN) 500 MG tablet Take 1 tablet (500 mg total) by mouth 2 (two) times daily. Patient not taking: Reported on 06/25/2016 08/09/14   Janne NapoleonNeese, Hope M, NP  naproxen (NAPROSYN) 500 MG tablet Take 1 tablet (500 mg total) by mouth 2 (two) times daily. Patient not taking: Reported on 06/25/2016 08/09/14   Janne NapoleonNeese, Hope M, NP  omeprazole (PRILOSEC) 20 MG capsule Take 1 capsule (20 mg total) by mouth daily. 06/25/16 06/25/17  Raliegh IpGottschalk, Djibril Glogowski M, DO  predniSONE (DELTASONE) 10 MG tablet 5,4,3,2,1 taper Patient not taking: Reported on 06/25/2016 07/07/14   Elson AreasSofia, Leslie K, PA-C  sucralfate (CARAFATE) 1 GM/10ML suspension Take 10 mLs (1 g total) by mouth 4 (four) times daily. 06/25/16  06/25/17  Raliegh Ip, DO  tobramycin (TOBREX) 0.3 % ophthalmic solution Place 1 drop into the left eye every 4 (four) hours. Patient not taking: Reported on 06/25/2016 08/09/14   Janne Napoleon, NP    Family History Family History  Problem Relation Age of Onset  . Cirrhosis Father   . Stroke Father   . Hypertension Brother     Social History Social History  Substance Use Topics  . Smoking status: Never Smoker  . Smokeless tobacco: Never Used  . Alcohol use No     Allergies   Patient has no known allergies.   Review of Systems Review of Systems  Constitutional: Negative for activity change, chills and fever.  HENT:  Negative for trouble swallowing.   Respiratory: Negative for cough, chest tightness and shortness of breath.   Cardiovascular: Negative for chest pain.  Gastrointestinal: Positive for abdominal pain, nausea and vomiting. Negative for blood in stool, constipation and diarrhea.  Genitourinary: Negative for decreased urine volume, difficulty urinating, discharge, dysuria, frequency, hematuria, penile pain, penile swelling, scrotal swelling, testicular pain and urgency.  Neurological: Negative for dizziness.   Physical Exam Updated Vital Signs BP (!) 144/100 (BP Location: Left Arm)   Pulse 82   Temp 98.3 F (36.8 C) (Oral)   Resp 18   Ht 5\' 8"  (1.727 m)   Wt 108.9 kg (240 lb)   SpO2 98%   BMI 36.49 kg/m   Physical Exam  Constitutional: He is oriented to person, place, and time. He appears well-developed and well-nourished. No distress.  HENT:  Head: Normocephalic and atraumatic.  Eyes: Conjunctivae and EOM are normal. Pupils are equal, round, and reactive to light. No scleral icterus.  Neck: Normal range of motion. Neck supple.  Cardiovascular: Normal rate, regular rhythm, normal heart sounds and intact distal pulses.  Exam reveals no gallop and no friction rub.   No murmur heard. Pulmonary/Chest: Effort normal and breath sounds normal. No respiratory distress.  Abdominal: Soft. Bowel sounds are normal. He exhibits no distension and no mass. There is no splenomegaly or hepatomegaly. There is tenderness (generalized w/ increased LLQ TTP. no peritoneal signs). There is no rebound, no guarding, no tenderness at McBurney's point and negative Murphy's sign.  Protuberant  Musculoskeletal: Normal range of motion. He exhibits no edema.  Neurological: He is alert and oriented to person, place, and time.  Skin: Skin is warm and dry. Capillary refill takes less than 2 seconds. He is not diaphoretic.  Psychiatric: He has a normal mood and affect. His behavior is normal. Judgment and thought content  normal.  Nursing note and vitals reviewed.   ED Treatments / Results  Labs (all labs ordered are listed, but only abnormal results are displayed) Labs Reviewed  COMPREHENSIVE METABOLIC PANEL - Abnormal; Notable for the following:       Result Value   Glucose, Bld 117 (*)    All other components within normal limits  CBC - Abnormal; Notable for the following:    Hemoglobin 12.7 (*)    All other components within normal limits  LIPASE, BLOOD  URINALYSIS, ROUTINE W REFLEX MICROSCOPIC    EKG  EKG Interpretation None       Radiology No results found.  Procedures Procedures (including critical care time)  Medications Ordered in ED Medications  ondansetron (ZOFRAN-ODT) disintegrating tablet 4 mg (4 mg Oral Given 06/25/16 1944)  gi cocktail (Maalox,Lidocaine,Donnatal) (30 mLs Oral Given 06/25/16 1944)     Initial Impression / Assessment and Plan /  ED Course  I have reviewed the triage vital signs and the nursing notes.  Pertinent labs & imaging results that were available during my care of the patient were reviewed by me and considered in my medical decision making (see chart for details).    1850: CBC, CMP reviewed and WNL.  Lipase and LFTs WNL.  UA negative for evidence infection or significant dehydration.  1925: Exam notable for generalized TTP but no peritoneal signs.  No signs concerning for cholecystitis, SBO or pancreatitis.  Symptoms likely GERD related.  GI cocktail, Zofran ODT ordered.  PO challenge patient.  1958: Recheck, patient doing well s/p GI cocktail and Zofran.  Tolerating PO fluids without difficulty.  Will offer solids.  2040: Patient tolerating solids without difficulty  Final Clinical Impressions(s) / ED Diagnoses   Final diagnoses:  Gastritis without bleeding, unspecified chronicity, unspecified gastritis type  Gastroesophageal reflux disease without esophagitis   Christopher Hurst is a 50 y.o. male that presented with generalized abdominal pain,  associated with nausea and vomiting.  His labs were unrevealing with a normal lipase, normal LFTs, normal CBC and CMP.  His exam was notable for generalized TTP that did not reveal any evidence of acute abdomen.  Diverticulitis considered but no diarrhea, nonbloody stools and no leukocytosis.  SBO considered but patient having normal BMs and passing flatus without difficulty.  Exam does not support cholecystitis or appendicitis.  He was given a GI cocktail and oral Zofran, which he responded well to.  He was able to tolerate PO without difficulty.  Discharge instructions reviewed with patient.  He voiced good understanding of return precautions.  He was discharged in stable condition home with his wife.  New Prescriptions New Prescriptions   OMEPRAZOLE (PRILOSEC) 20 MG CAPSULE    Take 1 capsule (20 mg total) by mouth daily.   SUCRALFATE (CARAFATE) 1 GM/10ML SUSPENSION    Take 10 mLs (1 g total) by mouth 4 (four) times daily.     Delynn Flavin [place], DO 06/25/16 2053    Maia Plan, MD 06/26/16 1045

## 2016-06-25 NOTE — ED Notes (Signed)
Pt stable, understands discharge instructions, and reasons for return.   

## 2016-07-21 ENCOUNTER — Emergency Department (HOSPITAL_COMMUNITY)
Admission: EM | Admit: 2016-07-21 | Discharge: 2016-07-21 | Disposition: A | Discharge status: Home / Self Care | Payer: Self-pay | Attending: Emergency Medicine | Admitting: Emergency Medicine

## 2016-07-21 ENCOUNTER — Encounter (HOSPITAL_COMMUNITY): Payer: Self-pay | Admitting: Emergency Medicine

## 2016-07-21 DIAGNOSIS — I10 Essential (primary) hypertension: Secondary | ICD-10-CM | POA: Insufficient documentation

## 2016-07-21 DIAGNOSIS — Z760 Encounter for issue of repeat prescription: Secondary | ICD-10-CM | POA: Insufficient documentation

## 2016-07-21 DIAGNOSIS — Z79899 Other long term (current) drug therapy: Secondary | ICD-10-CM | POA: Insufficient documentation

## 2016-07-21 MED ORDER — LISINOPRIL-HYDROCHLOROTHIAZIDE 10-12.5 MG PO TABS: 1.0000 | ORAL_TABLET | Freq: Every day | ORAL | 1 refills | Status: DC

## 2016-07-21 NOTE — ED Triage Notes (Signed)
Pt here for refill for htn meds

## 2016-07-21 NOTE — ED Notes (Signed)
States he is out of his HCTZ,/lisinapril x 3 days  Just lost his job  Last pm, so he doesn't have insurance . States he has filled out papers to go to outpt clinic however hasn't been seen yet.

## 2016-07-21 NOTE — ED Provider Notes (Signed)
[facility] Provider Note   CSN: [id number] Arrival date & time: 07/21/16  1324     History   Chief Complaint Chief Complaint  Patient presents with  . Medication Refill    HPI Christopher Hurst is a 51 y.o. male.  HPI Patient presents to ED for medication refill of blood pressure medication. States he ran out 3 days ago. Has been taking the medication for several years. Scheduled to follow up with new PCP in the next few weeks. He denies any headache, blurry vision, chest pain, shortness of breath, numbness or weakness.  Past Medical History:  Diagnosis Date  . Acid reflux   . Gout   . High cholesterol   . Hypertension   . Obesity     There are no active problems to display for this patient.   Past Surgical History:  Procedure Laterality Date  . CYSTECTOMY     testicle   . KNEE ARTHROSCOPY    . SHOULDER ARTHROSCOPY         Home Medications    Prior to Admission medications   Medication Sig Start Date End Date Taking? Authorizing Provider  Flaxseed, Linseed, (FLAXSEED OIL PO) Take 1 tablet by mouth daily.    [provider]  hydrochlorothiazide (HYDRODIURIL) 25 MG tablet Take 1 tablet (25 mg total) by mouth daily. Patient not taking: Reported on 06/25/2016 09/23/14   Lyndal Pulley, MD  HYDROcodone-acetaminophen (NORCO/VICODIN) 5-325 MG per tablet Take 2 tablets by mouth every 4 (four) hours as needed. Patient not taking: Reported on 06/25/2016 07/07/14   Elson Areas, PA-C  indomethacin (INDOCIN) 50 MG capsule Oral 50 mg 3 times daily until pain is tolerable then rapidly reduce dose to complete cessation of drug. Patient not taking: Reported on 06/25/2016 07/05/14   Oswaldo Conroy, PA-C  lisinopril (PRINIVIL,ZESTRIL) 20 MG tablet Take 1 tablet (20 mg total) by mouth daily. Patient not taking: Reported on 06/25/2016 09/23/14   Lyndal Pulley, MD  lisinopril-hydrochlorothiazide (ZESTORETIC) 10-12.5 MG tablet Take 1 tablet by mouth daily. 07/21/16   Londan Coplen,  PA-C  methocarbamol (ROBAXIN) 500 MG tablet Take 1 tablet (500 mg total) by mouth 2 (two) times daily. Patient not taking: Reported on 06/25/2016 08/09/14   Janne Napoleon, NP  Multiple Vitamin (MULTIVITAMIN WITH MINERALS) TABS tablet Take 1 tablet by mouth daily.    [provider]  naproxen (NAPROSYN) 500 MG tablet Take 1 tablet (500 mg total) by mouth 2 (two) times daily. Patient not taking: Reported on 06/25/2016 08/09/14   Janne Napoleon, NP  omeprazole (PRILOSEC) 20 MG capsule Take 1 capsule (20 mg total) by mouth daily. 06/25/16 06/25/17  Raliegh Ip, DO  predniSONE (DELTASONE) 10 MG tablet 5,4,3,2,1 taper Patient not taking: Reported on 06/25/2016 07/07/14   Elson Areas, PA-C  sucralfate (CARAFATE) 1 GM/10ML suspension Take 10 mLs (1 g total) by mouth 4 (four) times daily. 06/25/16 06/25/17  Raliegh Ip, DO  tobramycin (TOBREX) 0.3 % ophthalmic solution Place 1 drop into the left eye every 4 (four) hours. Patient not taking: Reported on 06/25/2016 08/09/14   Janne Napoleon, NP    Family History Family History  Problem Relation Age of Onset  . Cirrhosis Father   . Stroke Father   . Hypertension Brother     Social History Social History  Substance Use Topics  . Smoking status: Never Smoker  . Smokeless tobacco: Never Used  . Alcohol use No     Allergies  Patient has no known allergies.   Review of Systems Review of Systems  Constitutional: Negative for appetite change, chills and fever.  HENT: Negative for ear pain.   Eyes: Negative for photophobia and visual disturbance.  Respiratory: Negative for cough, chest tightness, shortness of breath and wheezing.   Cardiovascular: Negative for chest pain and palpitations.  Gastrointestinal: Negative for nausea and vomiting.  Musculoskeletal: Negative for myalgias.  Skin: Negative for rash.  Neurological: Negative for dizziness, weakness, light-headedness, numbness and headaches.     Physical Exam Updated Vital  Signs BP (!) 159/92 (BP Location: Right Arm)   Pulse 78   Temp 98.3 F (36.8 C) (Oral)   Resp 18   SpO2 97%   Physical Exam  Constitutional: He appears well-developed and well-nourished. No distress.  HENT:  Head: Normocephalic and atraumatic.  Eyes: Conjunctivae and EOM are normal. No scleral icterus.  Neck: Normal range of motion.  Cardiovascular: Normal rate and regular rhythm.   Pulmonary/Chest: Effort normal and breath sounds normal. No respiratory distress.  Neurological: He is alert.  Strength 5/5 in bilateral upper and lower extremities.  Skin: No rash noted. He is not diaphoretic.  Psychiatric: He has a normal mood and affect.  Nursing note and vitals reviewed.    ED Treatments / Results  Labs (all labs ordered are listed, but only abnormal results are displayed) Labs Reviewed - No data to display  EKG  EKG Interpretation None       Radiology No results found.  Procedures Procedures (including critical care time)  Medications Ordered in ED Medications - No data to display   Initial Impression / Assessment and Plan / ED Course  I have reviewed the triage vital signs and the nursing notes.  Pertinent labs & imaging results that were available during my care of the patient were reviewed by me and considered in my medical decision making (see chart for details).     Patient presents to ED for refill of his blood pressure medication. States that he takes lisinopril HCTZ for the past several years and it ran out of his medication 3 days ago. He states that he was fired from his job last night and is in the process of obtaining a new PCP. He currently denies any chest pain, shortness of breath, headache, blurry vision, hemoptysis, numbness or weakness. On physical exam he has normal normal rate and rhythm and lungs are clear to auscultation bilaterally. He has good strength in bilateral upper and lower extremities. No chest tenderness or pain present. Will  discharge patient with prescription for lisinopril HCTZ and advise him to follow up with his PCP for further medication refills and reevaluation. Patient appears stable for discharge at this time. Strict return precautions given.   Final Clinical Impressions(s) / ED Diagnoses   Final diagnoses:  Medication refill    New Prescriptions Discharge Medication List as of 07/21/2016 10:51 AM       Dietrich PatesKhatri, Sherley Mckenney, PA-C 07/21/16 1743    Doug SouJacubowitz, Sam, MD 07/22/16 [phone]

## 2016-07-21 NOTE — Discharge Instructions (Signed)
Continue home medications as previously prescribed. Follow-up with PCP for further evaluation. Return to ED for chest pain, trouble breathing, headache, blurry vision, loss of consciousness, numbness or weakness.

## 2016-09-26 ENCOUNTER — Emergency Department (HOSPITAL_COMMUNITY)
Admission: EM | Admit: 2016-09-26 | Discharge: 2016-09-26 | Disposition: A | Discharge status: Home / Self Care | Payer: Self-pay | Attending: Emergency Medicine | Admitting: Emergency Medicine

## 2016-09-26 ENCOUNTER — Encounter (HOSPITAL_COMMUNITY): Payer: Self-pay | Admitting: Emergency Medicine

## 2016-09-26 DIAGNOSIS — I10 Essential (primary) hypertension: Secondary | ICD-10-CM | POA: Insufficient documentation

## 2016-09-26 DIAGNOSIS — E7800 Pure hypercholesterolemia, unspecified: Secondary | ICD-10-CM | POA: Insufficient documentation

## 2016-09-26 DIAGNOSIS — Z79899 Other long term (current) drug therapy: Secondary | ICD-10-CM | POA: Insufficient documentation

## 2016-09-26 DIAGNOSIS — M109 Gout, unspecified: Secondary | ICD-10-CM | POA: Insufficient documentation

## 2016-09-26 LAB — I-STAT CHEM 8, ED
BUN: 12 mg/dL (ref 6–20)
Calcium, Ion: 1.12 mmol/L — ABNORMAL LOW (ref 1.15–1.40)
Chloride: 101 mmol/L (ref 101–111)
Creatinine, Ser: 0.8 mg/dL (ref 0.61–1.24)
Glucose, Bld: 104 mg/dL — ABNORMAL HIGH (ref 65–99)
HCT: 38 % — ABNORMAL LOW (ref 39.0–52.0)
Hemoglobin: 12.9 g/dL — ABNORMAL LOW (ref 13.0–17.0)
Potassium: 3.7 mmol/L (ref 3.5–5.1)
SODIUM: 140 mmol/L (ref 135–145)
TCO2: 29 mmol/L (ref 22–32)

## 2016-09-26 MED ORDER — LISINOPRIL-HYDROCHLOROTHIAZIDE 10-12.5 MG PO TABS: 1.0000 | ORAL_TABLET | Freq: Every day | ORAL | 3 refills | Status: DC

## 2016-09-26 MED ORDER — PREDNISONE 20 MG PO TABS
60.0000 mg | ORAL_TABLET | Freq: Once | ORAL | Status: AC
  Administered 2016-09-26: 60 mg via ORAL
  Filled 2016-09-26: qty 3

## 2016-09-26 MED ORDER — INDOMETHACIN 50 MG PO CAPS: 50.0000 mg | ORAL_CAPSULE | Freq: Three times a day (TID) | ORAL | 0 refills | Status: DC

## 2016-09-26 MED ORDER — PREDNISONE 10 MG PO TABS: 40.0000 mg | ORAL_TABLET | Freq: Every day | ORAL | 0 refills | Status: DC

## 2016-09-26 NOTE — ED Notes (Signed)
Declined W/C at D/C and was escorted to lobby by RN. 

## 2016-09-26 NOTE — ED Provider Notes (Signed)
[facility]-EMERGENCY DEPT Provider Note   CSN: [id number] Arrival date & time: 09/26/16  0541     History   Chief Complaint Chief Complaint  Patient presents with  . Leg Pain    gout flare    HPI Christopher Hurst is a 51 y.o. male.   Patient presents with pain to left great toe typical for a gout flare for him. Patient states he usually takes indomethacin. Patient also states he's out of his high blood pressure medicine. Been out for several days. Patient denies any chest pain shortness of breath. Patient currently does not have a primary care doctor.      Past Medical History:  Diagnosis Date  . Acid reflux   . Gout   . High cholesterol   . Hypertension   . Obesity     There are no active problems to display for this patient.   Past Surgical History:  Procedure Laterality Date  . CYSTECTOMY     testicle   . KNEE ARTHROSCOPY    . SHOULDER ARTHROSCOPY         Home Medications    Prior to Admission medications   Medication Sig Start Date End Date Taking? Authorizing Provider  Flaxseed, Linseed, (FLAXSEED OIL PO) Take 1 tablet by mouth daily.    [provider]  hydrochlorothiazide (HYDRODIURIL) 25 MG tablet Take 1 tablet (25 mg total) by mouth daily. Patient not taking: Reported on 06/25/2016 09/23/14   Lyndal PulleyKnott, Daniel, MD  HYDROcodone-acetaminophen (NORCO/VICODIN) 5-325 MG per tablet Take 2 tablets by mouth every 4 (four) hours as needed. Patient not taking: Reported on 06/25/2016 07/07/14   Elson AreasSofia, Leslie K, PA-C  indomethacin (INDOCIN) 50 MG capsule Oral 50 mg 3 times daily until pain is tolerable then rapidly reduce dose to complete cessation of drug. Patient not taking: Reported on 06/25/2016 07/05/14   Oswaldo Conroyreech, Victoria, PA-C  indomethacin (INDOCIN) 50 MG capsule Take 1 capsule (50 mg total) by mouth 3 (three) times daily with meals. 09/26/16   Vanetta MuldersZackowski, Kalmen Lollar, MD  lisinopril (PRINIVIL,ZESTRIL) 20 MG tablet Take 1 tablet (20 mg total) by mouth daily. Patient not  taking: Reported on 06/25/2016 09/23/14   Lyndal PulleyKnott, Daniel, MD  lisinopril-hydrochlorothiazide (ZESTORETIC) 10-12.5 MG tablet Take 1 tablet by mouth daily. 07/21/16   Khatri, Hina, PA-C  lisinopril-hydrochlorothiazide (ZESTORETIC) 10-12.5 MG tablet Take 1 tablet by mouth daily. 09/26/16   Vanetta MuldersZackowski, Miri Jose, MD  methocarbamol (ROBAXIN) 500 MG tablet Take 1 tablet (500 mg total) by mouth 2 (two) times daily. Patient not taking: Reported on 06/25/2016 08/09/14   Janne NapoleonNeese, Hope M, NP  Multiple Vitamin (MULTIVITAMIN WITH MINERALS) TABS tablet Take 1 tablet by mouth daily.    [provider]  naproxen (NAPROSYN) 500 MG tablet Take 1 tablet (500 mg total) by mouth 2 (two) times daily. Patient not taking: Reported on 06/25/2016 08/09/14   Janne NapoleonNeese, Hope M, NP  omeprazole (PRILOSEC) 20 MG capsule Take 1 capsule (20 mg total) by mouth daily. 06/25/16 06/25/17  Raliegh IpGottschalk, Ashly M, DO  predniSONE (DELTASONE) 10 MG tablet 5,4,3,2,1 taper Patient not taking: Reported on 06/25/2016 07/07/14   Elson AreasSofia, Leslie K, PA-C  predniSONE (DELTASONE) 10 MG tablet Take 4 tablets (40 mg total) by mouth daily. 09/26/16   Vanetta MuldersZackowski, Britt Theard, MD  sucralfate (CARAFATE) 1 GM/10ML suspension Take 10 mLs (1 g total) by mouth 4 (four) times daily. 06/25/16 06/25/17  Raliegh IpGottschalk, Ashly M, DO  tobramycin (TOBREX) 0.3 % ophthalmic solution Place 1 drop into the left eye every 4 (four) hours.  Patient not taking: Reported on 06/25/2016 08/09/14   Janne Napoleon, NP    Family History Family History  Problem Relation Age of Onset  . Cirrhosis Father   . Stroke Father   . Hypertension Brother     Social History Social History  Substance Use Topics  . Smoking status: Never Smoker  . Smokeless tobacco: Never Used  . Alcohol use No     Allergies   Patient has no known allergies.   Review of Systems Review of Systems  Constitutional: Negative for fever.  HENT: Negative for congestion.   Eyes: Negative for visual disturbance.  Respiratory: Negative for  shortness of breath.   Cardiovascular: Negative for chest pain.  Gastrointestinal: Negative for abdominal pain.  Genitourinary: Negative for dysuria.  Musculoskeletal: Positive for joint swelling.  Skin: Negative for rash.  Neurological: Negative for headaches.  Hematological: Does not bruise/bleed easily.  Psychiatric/Behavioral: Negative for confusion.     Physical Exam Updated Vital Signs BP (!) 145/100 (BP Location: Right Arm)   Pulse (!) 104   Temp 98.2 F (36.8 C) (Oral)   Resp 16   Ht 1.727 m ( )   Wt 108.9 kg (240 lb)   SpO2 98%   BMI 36.49 kg/m   Physical Exam  Constitutional: He is oriented to person, place, and time. He appears well-developed and well-nourished.  HENT:  Head: Normocephalic and atraumatic.  Mouth/Throat: Oropharynx is clear and moist.  Eyes: Pupils are equal, round, and reactive to light. Conjunctivae and EOM are normal.  Neck: Normal range of motion. Neck supple.  Cardiovascular: Normal rate and regular rhythm.   Pulmonary/Chest: Effort normal and breath sounds normal.  Abdominal: Bowel sounds are normal. He exhibits no distension. There is no tenderness.  Musculoskeletal: Normal range of motion. He exhibits edema and tenderness.  Redness swelling and tenderness to the left great toe. Dorsalis pedis pulses 2+. Good cap refill. No significant deformity.  Neurological: He is alert and oriented to person, place, and time. No cranial nerve deficit or sensory deficit. He exhibits normal muscle tone. Coordination normal.  Skin: Skin is warm.  Nursing note and vitals reviewed.    ED Treatments / Results  Labs (all labs ordered are listed, but only abnormal results are displayed) Labs Reviewed  I-STAT CHEM 8, ED - Abnormal; Notable for the following:       Result Value   Glucose, Bld 104 (*)    Calcium, Ion 1.12 (*)    Hemoglobin 12.9 (*)    HCT 38.0 (*)    All other components within normal limits    EKG  EKG Interpretation None         Radiology No results found.  Procedures Procedures (including critical care time)  Medications Ordered in ED Medications  predniSONE (DELTASONE) tablet 60 mg (not administered)     Initial Impression / Assessment and Plan / ED Course  I have reviewed the triage vital signs and the nursing notes.  Pertinent labs & imaging results that were available during my care of the patient were reviewed by me and considered in my medical decision making (see chart for details).    Patient's swelling to the left great toe consistent with gout. Also historically consistent with gout for him. Will treat with prednisone. Also given indomethacin for back up and understands that he cannot take both together. It's one or the other. Patient also needed renewal of his high pressure medicine. Basic electrolytes were checked and no significant abnormalities.  No evidence of diabetes. Patient's hypertensive meds will be renewed. Patient nontoxic no acute distress.  Final Clinical Impressions(s) / ED Diagnoses   Final diagnoses:  Acute gout involving toe of left foot, unspecified cause  Essential hypertension    New Prescriptions New Prescriptions   INDOMETHACIN (INDOCIN) 50 MG CAPSULE    Take 1 capsule (50 mg total) by mouth 3 (three) times daily with meals.   LISINOPRIL-HYDROCHLOROTHIAZIDE (ZESTORETIC) 10-12.5 MG TABLET    Take 1 tablet by mouth daily.   PREDNISONE (DELTASONE) 10 MG TABLET    Take 4 tablets (40 mg total) by mouth daily.     Vanetta Mulders, MD 09/26/16 [phone]

## 2016-09-26 NOTE — ED Triage Notes (Signed)
Pt c/0 9/10 bilateral foot pain that started last night, pt was working for 10 hours on his feet and pain is worse, pt states he has a hx of high BP, but he is out of medication due to no insurance.

## 2016-09-26 NOTE — ED Notes (Signed)
See EDP assessment 

## 2016-09-26 NOTE — Discharge Instructions (Signed)
Trial of prednisone for the gout. First dose provided here today. High blood pressure medication renewed. Today's labs without any significant abnormality. Since you prefer indomethacin for your gout prescription provided for that but you cannot take prednisone indomethacin together. Take one or the other.

## 2017-03-28 ENCOUNTER — Other Ambulatory Visit: Payer: Self-pay

## 2017-03-28 ENCOUNTER — Emergency Department (HOSPITAL_COMMUNITY)
Admission: EM | Admit: 2017-03-28 | Discharge: 2017-03-28 | Disposition: A | Discharge status: Home / Self Care | Payer: Self-pay | Attending: Emergency Medicine | Admitting: Emergency Medicine

## 2017-03-28 ENCOUNTER — Encounter (HOSPITAL_COMMUNITY): Payer: Self-pay

## 2017-03-28 DIAGNOSIS — M545 Low back pain, unspecified: Secondary | ICD-10-CM

## 2017-03-28 DIAGNOSIS — Z79899 Other long term (current) drug therapy: Secondary | ICD-10-CM | POA: Insufficient documentation

## 2017-03-28 DIAGNOSIS — I10 Essential (primary) hypertension: Secondary | ICD-10-CM | POA: Insufficient documentation

## 2017-03-28 MED ORDER — CYCLOBENZAPRINE HCL 10 MG PO TABS: 10.0000 mg | ORAL_TABLET | Freq: Two times a day (BID) | ORAL | 0 refills | Status: DC | PRN

## 2017-03-28 MED ORDER — TRAMADOL HCL 50 MG PO TABS: 50.0000 mg | ORAL_TABLET | Freq: Four times a day (QID) | ORAL | 0 refills | Status: DC | PRN

## 2017-03-28 NOTE — Discharge Instructions (Signed)
Please read attached information. If you experience any new or worsening signs or symptoms please return to the emergency room for evaluation. Please follow-up with your primary care provider or specialist as discussed. Please use medication prescribed only as directed and discontinue taking if you have any concerning signs or symptoms.   °

## 2017-03-28 NOTE — ED Triage Notes (Signed)
Pt states he has been having back pain for several weeks. Seen at another hospital for same and was told he had a compression fracture and herniated disc. Pt states he did not have an MRI. Pt has been taking ibuprofen and muscle relaxer for pain. Pt denies any numbness or tingling. Denies loss of bowel or bladder.

## 2017-03-28 NOTE — ED Provider Notes (Signed)
[facility] EMERGENCY DEPARTMENT Provider Note   CSN: [id number] Arrival date & time: 03/28/17  0844   History   Chief Complaint Chief Complaint  Patient presents with  . Back Pain    HPI Christopher Hurst is a 52 y.o. male.  HPI   52 year old male presents today with complaints of back pain.  Patient notes 2-week history of worsening lower back pain.  He notes he was seen at [facility] recently with x-rays of his back.  He was told that he had "fractures and herniated disc".  Patient notes he was started on prednisone, muscle relaxers and discharged home.  Patient notes he continues to have lower back pain, he denies any distal neurological deficits including weakness, decreased sensitivity or strength.  He notes he has been slightly constipated recently but having bowel movements, no urinary changes, no no red flags.  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    Past Medical History:  Diagnosis Date  . Acid reflux   . Gout   . High cholesterol   . Hypertension   . Obesity     There are no active problems to display for this patient.   Past Surgical History:  Procedure Laterality Date  . CYSTECTOMY     testicle   . KNEE ARTHROSCOPY    . SHOULDER ARTHROSCOPY       Home Medications    Prior to Admission medications   Medication Sig Start Date End Date Taking? Authorizing Provider  cyclobenzaprine (FLEXERIL) 10 MG tablet Take 1 tablet (10 mg total) by mouth 2 (two) times daily as needed for muscle spasms. 03/28/17   Rennae Ferraiolo, Tinnie Gens, PA-C  Flaxseed, Linseed, (FLAXSEED OIL PO) Take 1 tablet by mouth daily.    [provider]  hydrochlorothiazide (HYDRODIURIL) 25 MG tablet Take 1 tablet (25 mg total) by mouth daily. Patient not taking: Reported on 06/25/2016 09/23/14   Lyndal Pulley, MD  HYDROcodone-acetaminophen (NORCO/VICODIN) 5-325 MG  per tablet Take 2 tablets by mouth every 4 (four) hours as needed. Patient not taking: Reported on 06/25/2016 07/07/14   Elson Areas, PA-C  indomethacin (INDOCIN) 50 MG capsule Oral 50 mg 3 times daily until pain is tolerable then rapidly reduce dose to complete cessation of drug. Patient not taking: Reported on 06/25/2016 07/05/14   Oswaldo Conroy, PA-C  indomethacin (INDOCIN) 50 MG capsule Take 1 capsule (50 mg total) by mouth 3 (three) times daily with meals. 09/26/16   Vanetta Mulders, MD  lisinopril (PRINIVIL,ZESTRIL) 20 MG tablet Take 1 tablet (20 mg total) by mouth daily. Patient not taking: Reported on 06/25/2016 09/23/14   Lyndal Pulley, MD  lisinopril-hydrochlorothiazide (ZESTORETIC) 10-12.5 MG tablet Take 1 tablet by mouth daily. 07/21/16   Khatri, Hina, PA-C  lisinopril-hydrochlorothiazide (ZESTORETIC) 10-12.5 MG tablet Take 1 tablet by mouth daily. 09/26/16   Vanetta Mulders, MD  methocarbamol (ROBAXIN) 500 MG tablet Take 1 tablet (500 mg total) by mouth 2 (two) times daily. Patient not taking: Reported on 06/25/2016 08/09/14   Janne Napoleon, NP  Multiple Vitamin (MULTIVITAMIN WITH MINERALS) TABS tablet Take 1 tablet by mouth daily.    [provider]  naproxen (NAPROSYN) 500 MG tablet Take 1 tablet (500 mg total) by mouth 2 (two) times daily. Patient not taking: Reported on 06/25/2016 08/09/14   Janne Napoleon, NP  omeprazole (PRILOSEC) 20 MG capsule Take  1 capsule (20 mg total) by mouth daily. 06/25/16 06/25/17  Raliegh Ip, DO  predniSONE (DELTASONE) 10 MG tablet 5,4,3,2,1 taper Patient not taking: Reported on 06/25/2016 07/07/14   Elson Areas, PA-C  predniSONE (DELTASONE) 10 MG tablet Take 4 tablets (40 mg total) by mouth daily. 09/26/16   Vanetta Mulders, MD  sucralfate (CARAFATE) 1 GM/10ML suspension Take 10 mLs (1 g total) by mouth 4 (four) times daily. 06/25/16 06/25/17  Raliegh Ip, DO  tobramycin (TOBREX) 0.3 % ophthalmic solution Place 1 drop into the left eye every 4  (four) hours. Patient not taking: Reported on 06/25/2016 08/09/14   Janne Napoleon, NP  traMADol (ULTRAM) 50 MG tablet Take 1 tablet (50 mg total) by mouth every 6 (six) hours as needed. 03/28/17   Eyvonne Mechanic, PA-C    Family History Family History  Problem Relation Age of Onset  . Cirrhosis Father   . Stroke Father   . Hypertension Brother     Social History Social History   Tobacco Use  . Smoking status: Never Smoker  . Smokeless tobacco: Never Used  Substance Use Topics  . Alcohol use: Yes    Comment: rare  . Drug use: No     Allergies   Patient has no known allergies.   Review of Systems Review of Systems  All other systems reviewed and are negative.  Physical Exam Updated Vital Signs BP (!) 149/81 (BP Location: Right Arm)   Pulse (!) 103   Temp 98.7 F (37.1 C) (Oral)   Resp 20   SpO2 96%   Physical Exam  Constitutional: He is oriented to person, place, and time. He appears well-developed and well-nourished.  HENT:  Head: Normocephalic and atraumatic.  Eyes: Conjunctivae are normal. Pupils are equal, round, and reactive to light. Right eye exhibits no discharge. Left eye exhibits no discharge. No scleral icterus.  Neck: Normal range of motion. No JVD present. No tracheal deviation present.  Pulmonary/Chest: Effort normal. No stridor.  Neurological: He is alert and oriented to person, place, and time. Coordination normal.  Psychiatric: He has a normal mood and affect. His behavior is normal. Judgment and thought content normal.  Nursing note and vitals reviewed.    ED Treatments / Results  Labs (all labs ordered are listed, but only abnormal results are displayed) Labs Reviewed - No data to display  EKG  EKG Interpretation None       Radiology No results found.  Procedures Procedures (including critical care time)  Medications Ordered in ED Medications - No data to display   Initial Impression / Assessment and Plan / ED Course  I have  reviewed the triage vital signs and the nursing notes.  Pertinent labs & imaging results that were available during my care of the patient were reviewed by me and considered in my medical decision making (see chart for details).     Final Clinical Impressions(s) / ED Diagnoses   Final diagnoses:  Acute midline low back pain without sciatica    Labs:   Imaging:  Consults:  Therapeutics:  Discharge Meds: Flexeril, Ultram  Assessment/Plan: 52 year old male presents today with back pain.  He reports a fracture in his back with herniated disc.  He has no neurological deficits here.  No indication for further imaging at this time.  Patient will need outpatient primary care and/or neurosurgical follow-up.  Patient will be referred to [facility] health and wellness.  She is given strict return precautions, follow-up information.  He verbalized understanding and agreement to today's plan had no further questions or concerns.   ED Discharge Orders        Ordered    cyclobenzaprine (FLEXERIL) 10 MG tablet  2 times daily PRN     03/28/17 1322    traMADol (ULTRAM) 50 MG tablet  Every 6 hours PRN     03/28/17 1322       Eyvonne MechanicHedges, Elden Brucato, PA-C 03/28/17 1353    Azalia Bilisampos, Kevin, MD 03/28/17 1450

## 2017-04-13 ENCOUNTER — Encounter: Payer: Self-pay | Admitting: Internal Medicine

## 2017-04-13 ENCOUNTER — Ambulatory Visit (HOSPITAL_COMMUNITY)
Admission: RE | Admit: 2017-04-13 | Discharge: 2017-04-13 | Disposition: A | Discharge status: Home / Self Care | Payer: Self-pay | Source: Ambulatory Visit | Attending: Internal Medicine | Admitting: Internal Medicine

## 2017-04-13 ENCOUNTER — Ambulatory Visit: Payer: Self-pay | Attending: Internal Medicine | Admitting: Internal Medicine

## 2017-04-13 VITALS — BP 116/80 | HR 79 | Temp 98.3°F | Resp 16 | Ht 68.0 in | Wt 253.6 lb

## 2017-04-13 DIAGNOSIS — M109 Gout, unspecified: Secondary | ICD-10-CM | POA: Insufficient documentation

## 2017-04-13 DIAGNOSIS — M545 Low back pain, unspecified: Secondary | ICD-10-CM

## 2017-04-13 DIAGNOSIS — I10 Essential (primary) hypertension: Secondary | ICD-10-CM | POA: Insufficient documentation

## 2017-04-13 DIAGNOSIS — M47816 Spondylosis without myelopathy or radiculopathy, lumbar region: Secondary | ICD-10-CM | POA: Insufficient documentation

## 2017-04-13 DIAGNOSIS — M4854XA Collapsed vertebra, not elsewhere classified, thoracic region, initial encounter for fracture: Secondary | ICD-10-CM | POA: Insufficient documentation

## 2017-04-13 DIAGNOSIS — E7800 Pure hypercholesterolemia, unspecified: Secondary | ICD-10-CM | POA: Insufficient documentation

## 2017-04-13 DIAGNOSIS — G8929 Other chronic pain: Secondary | ICD-10-CM | POA: Insufficient documentation

## 2017-04-13 DIAGNOSIS — E669 Obesity, unspecified: Secondary | ICD-10-CM | POA: Insufficient documentation

## 2017-04-13 MED ORDER — IBUPROFEN 600 MG PO TABS: 600.0000 mg | ORAL_TABLET | Freq: Three times a day (TID) | ORAL | 0 refills | Status: DC | PRN

## 2017-04-13 MED ORDER — LISINOPRIL-HYDROCHLOROTHIAZIDE 10-12.5 MG PO TABS: 1.0000 | ORAL_TABLET | Freq: Every day | ORAL | 3 refills | Status: DC

## 2017-04-13 MED ORDER — ALLOPURINOL 100 MG PO TABS: 100.0000 mg | ORAL_TABLET | Freq: Every day | ORAL | 5 refills | Status: DC

## 2017-04-13 MED ORDER — ACETAMINOPHEN 500 MG PO TABS: 500.0000 mg | ORAL_TABLET | Freq: Four times a day (QID) | ORAL | 0 refills | Status: DC | PRN

## 2017-04-13 MED FILL — ALLOPURINOL 100 MG TABLET: 100 | 30 days supply | Qty: 30 | Fill #0

## 2017-04-13 MED FILL — IBUPROFEN 600 MG TABLET: 600 | 10 days supply | Qty: 30 | Fill #0

## 2017-04-13 NOTE — Progress Notes (Signed)
Subjective:    Patient ID: Christopher Hurst, male    DOB: 1965/02/25, 52 y.o.   MRN: [id number]  HPI  Patient here for ED follow up - seen [place] 03/28/17 for 2 wks acute on chronic low back pain. First visit to [facility] bridge until able to establish care wit PCP at end of April 2019 (when insurance active). Seen in Feb 2019 prior to [facility] ED at [facility] for same symptoms where told he had "fx" of tailbone, degenerative disc disease  and herniated disc (but no copy of imaging immediately available). Ongoing low back pain 6-31mo, never has had MRI due to lack of insurance. Pain is shooting up back, down B external hips. Feels unable to rest at night due to pain. Pain present at rest and with activity. Has been out of work x 3 weeks due to pain. Unable to fill rx from ED due to lack of $. Taking occ ibuprofen 600 BID prn from remote rx. Also requests renewal of prior allopurinol due to history of severe episodic gout attacks in 2018 while awaiting care with PCP after insurance startes end of April 2019  Past Medical History:  Diagnosis Date  . Acid reflux   . Gout   . High cholesterol   . Hypertension   . Obesity     Review of Systems  Constitutional: Negative for fatigue and fever.  Respiratory: Negative for cough and shortness of breath.   Cardiovascular: Negative for chest pain and leg swelling.  Musculoskeletal: Positive for arthralgias (B knee, R>L shoulder) and back pain.  Neurological: Negative for weakness.       Objective:    Physical Exam  Constitutional: He appears well-developed and well-nourished. No distress.  Obese. Son at side  Cardiovascular: Normal rate, regular rhythm and normal heart sounds.  No murmur heard. Pulmonary/Chest: Effort normal and breath sounds normal. No respiratory distress.  Musculoskeletal:  Back: full range of motion of thoracic and lumbar spine. mod tender to palpation over L3 area. Negative straight leg raise. DTR's are symmetrically intact.  Sensation intact in all dermatomes of the lower extremities. Full strength to manual muscle testing. patient is able to heel toe walk without difficulty and ambulates with antalgic gait.    BP 116/80   Pulse 79   Temp 98.3 F (36.8 C) (Oral)   Resp 16   Ht 5\' 8"  (1.727 m)   Wt 253 lb 9.6 oz (115 kg)   SpO2 95%   BMI 38.56 kg/m  Wt Readings from Last 3 Encounters:  04/13/17 253 lb 9.6 oz (115 kg)  09/26/16 240 lb (108.9 kg)  06/25/16 240 lb (108.9 kg)     Lab Results  Component Value Date   WBC 6.5 06/25/2016   HGB 12.9 (L) 09/26/2016   HCT 38.0 (L) 09/26/2016   PLT 245 06/25/2016   GLUCOSE 104 (H) 09/26/2016   ALT 25 06/25/2016   AST 22 06/25/2016   NA 140 09/26/2016   K 3.7 09/26/2016   CL 101 09/26/2016   CREATININE 0.80 09/26/2016   BUN 12 09/26/2016   CO2 30 06/25/2016    No results found.     Assessment & Plan:   Problem List Items Addressed This Visit    Gout    Reports sever and recurrent attacks in 2018 prompting rx for daily allopurinol. rx for refill on same provided today, though no acute symptoms on hx or exam.      High cholesterol    Takes OTC  flax seed. encouraged to have recheck with wellness/CPE once established with PCP      Relevant Medications   lisinopril-hydrochlorothiazide (ZESTORETIC) 10-12.5 MG tablet   Hypertension    BP Readings from Last 3 Encounters:  04/13/17 116/80  03/28/17 (!) 149/81  09/26/16 (!) 145/100   Well controlled today despite pain level -  The current medical regimen is effective;  continue present plan and medications.       Relevant Medications   lisinopril-hydrochlorothiazide (ZESTORETIC) 10-12.5 MG tablet   Midline low back pain without sciatica - Primary    Exam with tenderness over L3 center, concern for compression fx or other anatomic disruption despite lack of trauma prior to injury. rder DG l-spine to eval same possibility Continue NSAIDs with tylenol  For pain, including management of DDD and  DJD, in addition to prn muscle relaxer (which will be filled today as rx'd by ED last week). Pt declines tramadol due to ineffective relief with prior use of same. Neuro exam intact so no stat need for MRI, though encouraged to follow up with establish PCP to consider same as needed.      Relevant Medications   ibuprofen (ADVIL,MOTRIN) 600 MG tablet   acetaminophen (TYLENOL) 500 MG tablet   Other Relevant Orders   DG Lumbar Spine 2-3 Views       Rene PaciValerie Leschber, MD

## 2017-04-13 NOTE — Assessment & Plan Note (Signed)
Exam with tenderness over L3 center, concern for compression fx or other anatomic disruption despite lack of trauma prior to injury. rder DG l-spine to eval same possibility Continue NSAIDs with tylenol  For pain, including management of DDD and DJD, in addition to prn muscle relaxer (which will be filled today as rx'd by ED last week). Pt declines tramadol due to ineffective relief with prior use of same. Neuro exam intact so no stat need for MRI, though encouraged to follow up with establish PCP to consider same as needed.

## 2017-04-13 NOTE — Patient Instructions (Addendum)
It was good to see you today.  We have reviewed your prior records today  Test(s) ordered today: [facility] of low back at [facility]. Your results will be released to MyChart (or called to you) after review, usually within 72hours after test completion. If any changes need to be made, you will be notified at that same time. Consider MRI of back at future date if needed  Medications reviewed and updated Continue ibuprofen 600mg  as needed, tylenol over the counter as needed for arthritis and back pain. Also renew allopurinol for gout.  Your prescription(s) have been submitted to your pharmacy. Please take as directed and contact our office if you believe you are having problem(s) with the medication(s).  Please schedule followup in 3-6 weeks if continued pain (or with new PCP), please call sooner if problems.   Back Exercises If you have pain in your back, do these exercises 2-3 times each day or as told by your doctor. When the pain goes away, do the exercises once each day, but repeat the steps more times for each exercise (do more repetitions). If you do not have pain in your back, do these exercises once each day or as told by your doctor. Exercises Single Knee to Chest  Do these steps 3-5 times in a row for each leg: 1. Lie on your back on a firm bed or the floor with your legs stretched out. 2. Bring one knee to your chest. 3. Hold your knee to your chest by grabbing your knee or thigh. 4. Pull on your knee until you feel a gentle stretch in your lower back. 5. Keep doing the stretch for 10-30 seconds. 6. Slowly let go of your leg and straighten it.  Pelvic Tilt  Do these steps 5-10 times in a row: 1. Lie on your back on a firm bed or the floor with your legs stretched out. 2. Bend your knees so they point up to the ceiling. Your feet should be flat on the floor. 3. Tighten your lower belly (abdomen) muscles to press your lower back against the floor. This will make your tailbone  point up to the ceiling instead of pointing down to your feet or the floor. 4. Stay in this position for 5-10 seconds while you gently tighten your muscles and breathe evenly.  Cat-Cow  Do these steps until your lower back bends more easily: 1. Get on your hands and knees on a firm surface. Keep your hands under your shoulders, and keep your knees under your hips. You may put padding under your knees. 2. Let your head hang down, and make your tailbone point down to the floor so your lower back is round like the back of a cat. 3. Stay in this position for 5 seconds. 4. Slowly lift your head and make your tailbone point up to the ceiling so your back hangs low (sags) like the back of a cow. 5. Stay in this position for 5 seconds.  Press-Ups  Do these steps 5-10 times in a row: 1. Lie on your belly (face-down) on the floor. 2. Place your hands near your head, about shoulder-width apart. 3. While you keep your back relaxed and keep your hips on the floor, slowly straighten your arms to raise the top half of your body and lift your shoulders. Do not use your back muscles. To make yourself more comfortable, you may change where you place your hands. 4. Stay in this position for 5 seconds. 5. Slowly return  to lying flat on the floor.  Bridges  Do these steps 10 times in a row: 1. Lie on your back on a firm surface. 2. Bend your knees so they point up to the ceiling. Your feet should be flat on the floor. 3. Tighten your butt muscles and lift your butt off of the floor until your waist is almost as high as your knees. If you do not feel the muscles working in your butt and the back of your thighs, slide your feet 1-2 inches farther away from your butt. 4. Stay in this position for 3-5 seconds. 5. Slowly lower your butt to the floor, and let your butt muscles relax.  If this exercise is too easy, try doing it with your arms crossed over your chest. Belly Crunches  Do these steps 5-10 times in  a row: 1. Lie on your back on a firm bed or the floor with your legs stretched out. 2. Bend your knees so they point up to the ceiling. Your feet should be flat on the floor. 3. Cross your arms over your chest. 4. Tip your chin a little bit toward your chest but do not bend your neck. 5. Tighten your belly muscles and slowly raise your chest just enough to lift your shoulder blades a tiny bit off of the floor. 6. Slowly lower your chest and your head to the floor.  Back Lifts Do these steps 5-10 times in a row: 1. Lie on your belly (face-down) with your arms at your sides, and rest your forehead on the floor. 2. Tighten the muscles in your legs and your butt. 3. Slowly lift your chest off of the floor while you keep your hips on the floor. Keep the back of your head in line with the curve in your back. Look at the floor while you do this. 4. Stay in this position for 3-5 seconds. 5. Slowly lower your chest and your face to the floor.  Contact a doctor if:  Your back pain gets a lot worse when you do an exercise.  Your back pain does not lessen 2 hours after you exercise. If you have any of these problems, stop doing the exercises. Do not do them again unless your doctor says it is okay. Get help right away if:  You have sudden, very bad back pain. If this happens, stop doing the exercises. Do not do them again unless your doctor says it is okay. This information is not intended to replace advice given to you by your health care provider. Make sure you discuss any questions you have with your health care provider. Document Released: 02/06/2010 Document Revised: 06/12/2015 Document Reviewed: 02/28/2014 Elsevier Interactive Patient Education  [employer].

## 2017-04-13 NOTE — Assessment & Plan Note (Signed)
Takes OTC flax seed. encouraged to have recheck with wellness/CPE once established with PCP

## 2017-04-13 NOTE — Assessment & Plan Note (Signed)
BP Readings from Last 3 Encounters:  04/13/17 116/80  03/28/17 (!) 149/81  09/26/16 (!) 145/100   Well controlled today despite pain level -  The current medical regimen is effective;  continue present plan and medications.

## 2017-04-13 NOTE — Assessment & Plan Note (Signed)
Reports sever and recurrent attacks in 2018 prompting rx for daily allopurinol. rx for refill on same provided today, though no acute symptoms on hx or exam.

## 2017-04-14 ENCOUNTER — Telehealth: Payer: Self-pay

## 2017-04-14 NOTE — Telephone Encounter (Signed)
-----   Message from Margaretmary LombardNubia K [place], [place] sent at 04/14/2017  2:35 PM EDT ----- Please share results with patient

## 2017-04-14 NOTE — Telephone Encounter (Signed)
CMA attempt to call patient to inform on lab results.   No answer and left a VM for patient.  If patient call back, please inform: Please call patient - the xray shows degenerative disc disease (back arthritis as expected) and old fractures of spine bones (not tailbone fracture) Continue pain med and muscle relaxer as discussed yesterday and RTC if increasing pain, falls or weakness. No other need for MRI at this time

## 2017-04-14 NOTE — Progress Notes (Signed)
Please share results with patient

## 2017-05-11 ENCOUNTER — Encounter (HOSPITAL_COMMUNITY): Payer: Self-pay

## 2017-05-11 ENCOUNTER — Other Ambulatory Visit: Payer: Self-pay

## 2017-05-11 ENCOUNTER — Emergency Department (HOSPITAL_COMMUNITY)
Admission: EM | Admit: 2017-05-11 | Discharge: 2017-05-11 | Disposition: A | Discharge status: Home / Self Care | Payer: Self-pay | Attending: Emergency Medicine | Admitting: Emergency Medicine

## 2017-05-11 DIAGNOSIS — Z79899 Other long term (current) drug therapy: Secondary | ICD-10-CM | POA: Insufficient documentation

## 2017-05-11 DIAGNOSIS — M545 Low back pain, unspecified: Secondary | ICD-10-CM

## 2017-05-11 DIAGNOSIS — I10 Essential (primary) hypertension: Secondary | ICD-10-CM | POA: Insufficient documentation

## 2017-05-11 MED ORDER — DICLOFENAC SODIUM 75 MG PO TBEC: 75.0000 mg | DELAYED_RELEASE_TABLET | Freq: Two times a day (BID) | ORAL | 0 refills | Status: DC

## 2017-05-11 MED ORDER — KETOROLAC TROMETHAMINE 60 MG/2ML IM SOLN
60.0000 mg | Freq: Once | INTRAMUSCULAR | Status: AC
  Administered 2017-05-11: 60 mg via INTRAMUSCULAR
  Filled 2017-05-11: qty 2

## 2017-05-11 MED ORDER — METHYLPREDNISOLONE SODIUM SUCC 125 MG IJ SOLR
125.0000 mg | Freq: Once | INTRAMUSCULAR | Status: AC
  Administered 2017-05-11: 125 mg via INTRAMUSCULAR
  Filled 2017-05-11: qty 2

## 2017-05-11 MED ORDER — METHOCARBAMOL 500 MG PO TABS: 500.0000 mg | ORAL_TABLET | Freq: Two times a day (BID) | ORAL | 0 refills | Status: DC

## 2017-05-11 NOTE — ED Triage Notes (Signed)
Pt presents for complaint of lower back pain. Pt reports recently was told he had sacral fracture and herniated disc but uncertain of dx. Pt ambulatory, states pain has worsened over night. Spasms while in bed. Has appointment may 3rd with PCP.

## 2017-05-11 NOTE — ED Provider Notes (Signed)
[facility] EMERGENCY DEPARTMENT Provider Note   CSN: [id number] Arrival date & time: 05/11/17  4098     History   Chief Complaint Chief Complaint  Patient presents with  . Back Pain    HPI Christopher Dollins. is a 52 y.o. male.  The history is provided by the patient. No language interpreter was used.  Back Pain   This is a new problem. The problem occurs constantly. The pain is associated with no known injury. The pain is present in the thoracic spine. The pain does not radiate. The pain is moderate. The symptoms are aggravated by twisting. The pain is the same all the time. Pertinent negatives include no paresthesias and no paresis. He has tried nothing for the symptoms. The treatment provided no relief.   Pt complains of exacerbation of pain in her low back.  Pt reports he has 2 compression fractures at T11 and T12.  Past Medical History:  Diagnosis Date  . Acid reflux   . Gout   . High cholesterol   . Hypertension   . Obesity     Patient Active Problem List   Diagnosis Date Noted  . Midline low back pain without sciatica 04/13/2017  . Obesity   . Hypertension   . High cholesterol   . Gout     Past Surgical History:  Procedure Laterality Date  . CYSTECTOMY     testicle   . KNEE ARTHROSCOPY    . SHOULDER ARTHROSCOPY          Home Medications    Prior to Admission medications   Medication Sig Start Date End Date Taking? Authorizing Provider  acetaminophen (TYLENOL) 500 MG tablet Take 1 tablet (500 mg total) by mouth every 6 (six) hours as needed for mild pain. 04/13/17   Newt Lukes, MD  allopurinol (ZYLOPRIM) 100 MG tablet Take 1 tablet (100 mg total) by mouth daily. 04/13/17   Newt Lukes, MD  cyclobenzaprine (FLEXERIL) 10 MG tablet Take 1 tablet (10 mg total) by mouth 2 (two) times daily as needed for muscle spasms. 03/28/17   Hedges, Tinnie Gens, PA-C  Flaxseed, Linseed, (FLAXSEED OIL PO) Take 1 tablet by mouth daily.     [provider]  ibuprofen (ADVIL,MOTRIN) 600 MG tablet Take 1 tablet (600 mg total) by mouth every 8 (eight) hours as needed for moderate pain. 04/13/17   Newt Lukes, MD  lisinopril-hydrochlorothiazide (ZESTORETIC) 10-12.5 MG tablet Take 1 tablet by mouth daily. 04/13/17   Newt Lukes, MD  Multiple Vitamin (MULTIVITAMIN WITH MINERALS) TABS tablet Take 1 tablet by mouth daily.    [provider]  omeprazole (PRILOSEC) 20 MG capsule Take 1 capsule (20 mg total) by mouth daily. 06/25/16 06/25/17  Raliegh Ip, DO    Family History Family History  Problem Relation Age of Onset  . Cirrhosis Father   . Stroke Father   . Hypertension Brother     Social History Social History   Tobacco Use  . Smoking status: Never Smoker  . Smokeless tobacco: Never Used  Substance Use Topics  . Alcohol use: Yes    Comment: rare  . Drug use: No     Allergies   Patient has no known allergies.   Review of Systems Review of Systems  Musculoskeletal: Positive for back pain.  Neurological: Negative for paresthesias.  All other systems reviewed and are negative.    Physical Exam Updated Vital Signs BP 119/72 (BP Location: Right Arm)  Pulse 84   Temp 98 F (36.7 C) (Oral)   Resp 18   SpO2 98%   Physical Exam  Constitutional: He appears well-developed and well-nourished.  HENT:  Head: Normocephalic and atraumatic.  Eyes: Conjunctivae are normal.  Neck: Neck supple.  Cardiovascular: Normal rate and regular rhythm.  No murmur heard. Pulmonary/Chest: Effort normal and breath sounds normal. No respiratory distress.  Abdominal: Soft. There is no tenderness.  Musculoskeletal: He exhibits tenderness.  Decreased range of motion  nv and ns intact.    Neurological: He is alert.  Skin: Skin is warm and dry.  Psychiatric: He has a normal mood and affect.  Nursing note and vitals reviewed.    ED Treatments / Results  Labs (all labs ordered are listed, but  only abnormal results are displayed) Labs Reviewed - No data to display  EKG None  Radiology No results found.  Procedures Procedures (including critical care time)  Medications Ordered in ED Medications  methylPREDNISolone sodium succinate (SOLU-MEDROL) 125 mg/2 mL injection 125 mg (125 mg Intramuscular Given 05/11/17 1206)  ketorolac (TORADOL) injection 60 mg (60 mg Intramuscular Given 05/11/17 1206)     Initial Impression / Assessment and Plan / ED Course  I have reviewed the triage vital signs and the nursing notes.  Pertinent labs & imaging results that were available during my care of the patient were reviewed by me and considered in my medical decision making (see chart for details).     Pt given solumedrol and torodol.   I will try pt on Robaxin and voltaren.   [facility]   Final Clinical Impressions(s) / ED Diagnoses   Final diagnoses:  Acute low back pain without sciatica, unspecified back pain laterality    ED Discharge Orders        Ordered    diclofenac (VOLTAREN) 75 MG EC tablet  2 times daily     05/11/17 1239    methocarbamol (ROBAXIN) 500 MG tablet  2 times daily     05/11/17 1239    An After Visit Summary was printed and given to the patient.    Osie CheeksSofia, Matheo Rathbone K, PA-C 05/11/17 1239    Lorre NickAllen, Anthony, MD 05/13/17 1009

## 2017-05-11 NOTE — Discharge Instructions (Addendum)
Return if any problems.

## 2017-05-20 ENCOUNTER — Ambulatory Visit: Payer: Self-pay | Attending: Nurse Practitioner | Admitting: Nurse Practitioner

## 2017-05-20 ENCOUNTER — Encounter: Payer: Self-pay | Admitting: Nurse Practitioner

## 2017-05-20 VITALS — BP 137/84 | HR 80 | Temp 98.2°F | Ht 68.0 in | Wt 257.2 lb

## 2017-05-20 DIAGNOSIS — I10 Essential (primary) hypertension: Secondary | ICD-10-CM | POA: Insufficient documentation

## 2017-05-20 DIAGNOSIS — Z0000 Encounter for general adult medical examination without abnormal findings: Secondary | ICD-10-CM

## 2017-05-20 DIAGNOSIS — Z9889 Other specified postprocedural states: Secondary | ICD-10-CM | POA: Insufficient documentation

## 2017-05-20 DIAGNOSIS — M25512 Pain in left shoulder: Secondary | ICD-10-CM | POA: Insufficient documentation

## 2017-05-20 DIAGNOSIS — R531 Weakness: Secondary | ICD-10-CM | POA: Insufficient documentation

## 2017-05-20 DIAGNOSIS — M25511 Pain in right shoulder: Secondary | ICD-10-CM | POA: Insufficient documentation

## 2017-05-20 DIAGNOSIS — E7800 Pure hypercholesterolemia, unspecified: Secondary | ICD-10-CM | POA: Insufficient documentation

## 2017-05-20 DIAGNOSIS — Z760 Encounter for issue of repeat prescription: Secondary | ICD-10-CM | POA: Insufficient documentation

## 2017-05-20 DIAGNOSIS — Z8249 Family history of ischemic heart disease and other diseases of the circulatory system: Secondary | ICD-10-CM | POA: Insufficient documentation

## 2017-05-20 DIAGNOSIS — E669 Obesity, unspecified: Secondary | ICD-10-CM | POA: Insufficient documentation

## 2017-05-20 DIAGNOSIS — Z79899 Other long term (current) drug therapy: Secondary | ICD-10-CM | POA: Insufficient documentation

## 2017-05-20 DIAGNOSIS — R200 Anesthesia of skin: Secondary | ICD-10-CM | POA: Insufficient documentation

## 2017-05-20 DIAGNOSIS — Z823 Family history of stroke: Secondary | ICD-10-CM | POA: Insufficient documentation

## 2017-05-20 DIAGNOSIS — M5442 Lumbago with sciatica, left side: Secondary | ICD-10-CM | POA: Insufficient documentation

## 2017-05-20 DIAGNOSIS — M5441 Lumbago with sciatica, right side: Secondary | ICD-10-CM | POA: Insufficient documentation

## 2017-05-20 DIAGNOSIS — M1A9XX0 Chronic gout, unspecified, without tophus (tophi): Secondary | ICD-10-CM | POA: Insufficient documentation

## 2017-05-20 DIAGNOSIS — M4854XA Collapsed vertebra, not elsewhere classified, thoracic region, initial encounter for fracture: Secondary | ICD-10-CM | POA: Insufficient documentation

## 2017-05-20 DIAGNOSIS — M19012 Primary osteoarthritis, left shoulder: Secondary | ICD-10-CM | POA: Insufficient documentation

## 2017-05-20 DIAGNOSIS — Z791 Long term (current) use of non-steroidal anti-inflammatories (NSAID): Secondary | ICD-10-CM | POA: Insufficient documentation

## 2017-05-20 DIAGNOSIS — K219 Gastro-esophageal reflux disease without esophagitis: Secondary | ICD-10-CM | POA: Insufficient documentation

## 2017-05-20 MED ORDER — DICLOFENAC SODIUM 75 MG PO TBEC: 75.0000 mg | DELAYED_RELEASE_TABLET | Freq: Two times a day (BID) | ORAL | 1 refills | Status: DC

## 2017-05-20 MED ORDER — LISINOPRIL-HYDROCHLOROTHIAZIDE 10-12.5 MG PO TABS: 1.0000 | ORAL_TABLET | Freq: Every day | ORAL | 1 refills | Status: DC

## 2017-05-20 MED ORDER — METHOCARBAMOL 500 MG PO TABS: 500.0000 mg | ORAL_TABLET | Freq: Two times a day (BID) | ORAL | 1 refills | Status: DC

## 2017-05-20 MED ORDER — DICLOFENAC SODIUM 75 MG PO TBEC: 75.0000 mg | DELAYED_RELEASE_TABLET | Freq: Two times a day (BID) | ORAL | 0 refills | Status: DC

## 2017-05-20 MED ORDER — METHOCARBAMOL 500 MG PO TABS: 500.0000 mg | ORAL_TABLET | Freq: Two times a day (BID) | ORAL | 1 refills | Status: AC

## 2017-05-20 MED FILL — DICLOFENAC SOD EC 75 MG TAB: 75 | 30 days supply | Qty: 60 | Fill #0

## 2017-05-20 MED FILL — LISINOPRIL-HCTZ 10-12.5 MG: 10-12.5 | 30 days supply | Qty: 30 | Fill #0

## 2017-05-20 MED FILL — METHOCARBAMOL 500 MG TABS: 500 | 15 days supply | Qty: 30 | Fill #0

## 2017-05-20 NOTE — Progress Notes (Signed)
Assessment & Plan:  Christopher Hurst was seen today for establish care and medication refill.  Diagnoses and all orders for this visit:  Acute bilateral low back pain with bilateral sciatica -     diclofenac (VOLTAREN) 75 MG EC tablet; Take 1 tablet (75 mg total) by mouth 2 (two) times daily. -     methocarbamol (ROBAXIN) 500 MG tablet; Take 1 tablet (500 mg total) by mouth 2 (two) times daily for 30 doses. Work on losing weight to help reduce back pain. May alternate with heat and ice application for pain relief. May also alternate with acetaminophen as prescribed for back pain. Other alternatives include massage, acupuncture and water aerobics.  You must stay active and avoid a sedentary lifestyle.    Acute pain of right shoulder -     diclofenac (VOLTAREN) 75 MG EC tablet; Take 1 tablet (75 mg total) by mouth 2 (two) times daily. -     methocarbamol (ROBAXIN) 500 MG tablet; Take 1 tablet (500 mg total) by mouth 2 (two) times daily for 30 doses. May alternated with heat and ice for pain relief May alternate with acetaminophen for pain relief  Routine adult health maintenance -     CBC -     CMP14+EGFR -     Lipid panel   Essential hypertension -     lisinopril-hydrochlorothiazide (ZESTORETIC) 10-12.5 MG tablet; Take 1 tablet by mouth daily.   Chronic Gout Continue allopurinol 158m daily -     Uric Acid    Patient has been counseled on age-appropriate routine health concerns for screening and prevention. These are reviewed and up-to-date. Referrals have been placed accordingly. Immunizations are up-to-date or declined.    Subjective:   Chief Complaint  Patient presents with  . Establish Care    Pt. is here to establish care for his lower back pain. Pt. stated his back is hurting him alot.   . Medication Refill   HPI Christopher Hurst 52y.o. male presents to office today to establish care. He is accompanied by his son today with complaints of low back pain, right shoulder  pain and right knee pain.  He also has a history of gout and has been out of his allopurinol recently. His PMH is significant for HTN and HPL.   Essential Hypertension Chronic. Well controlled. Endorses medication compliance. Taking zestoretic 10-12.551mdaily. Denies chest pain, shortness of breath, palpitations, lightheadedness, dizziness, headaches or BLE edema.  BP Readings from Last 3 Encounters:  05/20/17 137/84  05/11/17 129/74  04/13/17 116/80    Back Pain Patient presents for presents evaluation of low back problems.  Symptoms have been present for 3 months and include pain in lower back and weakness in both legs. Initial inciting event: noneSymptoms are worst: all day. Alleviating factors identifiable by patient are being perfectly still. Exacerbating factors identifiable by patient are bending, twisting, movement, sitting for prolonged periods of time. Treatments so far initiated by patient: ibuprofen Previous lower back problems: none. Previous workup: lumbar spine xray.   He reports numbness in left leg when he attempts to stand from prolonged sitting. He does not wear a back brace or knee brace.  Lumbar Spine 1.  Mild T10 and T11 compression fractures, age undetermined. 2.  Diffuse degenerative change lumbar spine and both hips  Shoulder Pain He has a history of left shoulder pain with severe arthritis 2002. He required steroid injections at that time. Patient currently complaints of right shoulder pain. This is evaluated as  a none injury. The pain is described as aching.  The onset of the pain was several months and with worsening over the past few months.  The pain occurs intermittently with activity and lasts minutes to hours. Location is entire ball of shoulder: global. No history of dislocation. Symptoms are aggravated by reaching, lifting, pulling, pushing, carrying, throwing, twisting, ADL's, repetitive use, work at or above shoulder height. Symptoms are diminished by  rest.  No  stiffness, no weakness, no swelling, no crepitus noted is reported. Treattments: OTC ibuprofen which has provided some relief of pain.      Review of Systems  Constitutional: Negative for fever, malaise/fatigue and weight loss.  HENT: Negative.  Negative for nosebleeds.   Eyes: Negative.  Negative for blurred vision, double vision and photophobia.  Respiratory: Negative.  Negative for cough and shortness of breath.   Cardiovascular: Negative.  Negative for chest pain, palpitations and leg swelling.  Gastrointestinal: Positive for heartburn. Negative for nausea and vomiting.  Musculoskeletal: Positive for back pain and joint pain. Negative for falls and myalgias.  Neurological: Positive for focal weakness. Negative for dizziness, seizures and headaches.  Psychiatric/Behavioral: Negative.  Negative for suicidal ideas.    Past Medical History:  Diagnosis Date  . Acid reflux   . Gout   . High cholesterol   . Hypertension   . Obesity     Past Surgical History:  Procedure Laterality Date  . CYSTECTOMY     testicle   . KNEE ARTHROSCOPY     left two knee surgery   . SHOULDER ARTHROSCOPY     one left shoulder   . TONSILLECTOMY      Family History  Problem Relation Age of Onset  . Cirrhosis Father   . Stroke Father   . Hypertension Brother     Social History Reviewed with no changes to be made today.   Outpatient Medications Prior to Visit  Medication Sig Dispense Refill  . allopurinol (ZYLOPRIM) 100 MG tablet Take 1 tablet (100 mg total) by mouth daily. 30 tablet 5  . omeprazole (PRILOSEC) 20 MG capsule Take 1 capsule (20 mg total) by mouth daily. 30 capsule 0  . diclofenac (VOLTAREN) 75 MG EC tablet Take 1 tablet (75 mg total) by mouth 2 (two) times daily. 20 tablet 0  . ibuprofen (ADVIL,MOTRIN) 600 MG tablet Take 1 tablet (600 mg total) by mouth every 8 (eight) hours as needed for moderate pain. 30 tablet 0  . lisinopril-hydrochlorothiazide (ZESTORETIC) 10-12.5 MG tablet  Take 1 tablet by mouth daily. 30 tablet 3  . methocarbamol (ROBAXIN) 500 MG tablet Take 1 tablet (500 mg total) by mouth 2 (two) times daily. 20 tablet 0  . acetaminophen (TYLENOL) 500 MG tablet Take 1 tablet (500 mg total) by mouth every 6 (six) hours as needed for mild pain. (Patient not taking: Reported on 05/20/2017) 30 tablet 0  . Flaxseed, Linseed, (FLAXSEED OIL PO) Take 1 tablet by mouth daily.    . Multiple Vitamin (MULTIVITAMIN WITH MINERALS) TABS tablet Take 1 tablet by mouth daily.    . cyclobenzaprine (FLEXERIL) 10 MG tablet Take 1 tablet (10 mg total) by mouth 2 (two) times daily as needed for muscle spasms. (Patient not taking: Reported on 05/20/2017) 20 tablet 0   No facility-administered medications prior to visit.     No Known Allergies     Objective:    BP 137/84 (BP Location: Left Arm, Patient Position: Sitting, Cuff Size: Normal)   Pulse 80  Temp 98.2 F (36.8 C) (Oral)   Ht '5\' 8"'  (1.727 m)   Wt 257 lb 3.2 oz (116.7 kg)   SpO2 96%   BMI 39.11 kg/m  Wt Readings from Last 3 Encounters:  05/20/17 257 lb 3.2 oz (116.7 kg)  04/13/17 253 lb 9.6 oz (115 kg)  09/26/16 240 lb (108.9 kg)    Physical Exam  Constitutional: He is oriented to person, place, and time. He appears well-developed and well-nourished. He is cooperative.  HENT:  Head: Normocephalic and atraumatic.  Eyes: EOM are normal.  Neck: Normal range of motion.  Cardiovascular: Normal rate, regular rhythm and normal heart sounds. Exam reveals no gallop and no friction rub.  No murmur heard. Pulmonary/Chest: Effort normal and breath sounds normal. No tachypnea. No respiratory distress. He has no decreased breath sounds. He has no wheezes. He has no rhonchi. He has no rales. He exhibits no tenderness.  Abdominal: Soft. Bowel sounds are normal.  Musculoskeletal: He exhibits no edema.       Right shoulder: He exhibits decreased range of motion and tenderness. He exhibits no swelling.       Right knee: He  exhibits swelling.       Lumbar back: He exhibits decreased range of motion and pain.  Neurological: He is alert and oriented to person, place, and time. Coordination normal.  Skin: Skin is warm and dry.  Psychiatric: He has a normal mood and affect. His behavior is normal. Judgment and thought content normal.  Nursing note and vitals reviewed.      Patient has been counseled extensively about nutrition and exercise as well as the importance of adherence with medications and regular follow-up. The patient was given clear instructions to go to ER or return to medical center if symptoms don't improve, worsen or new problems develop. The patient verbalized understanding.   Follow-up: Return in about 5 weeks (around 06/24/2017) for Needs appointment with financial representative.Gildardo Pounds, FNP-BC [facility] [place], [place]   05/20/2017, 11:30 PM

## 2017-05-20 NOTE — Patient Instructions (Signed)
Acute Pain, Adult Acute pain is a type of pain that may last for just a few days or as long as six months. It is often related to an illness, injury, or medical procedure. Acute pain may be mild, moderate, or severe. It usually goes away once your injury has healed or you are no longer ill. Pain can make it hard for you to do daily activities. It can cause anxiety and lead to other problems if left untreated. Treatment depends on the cause and severity of your acute pain. Follow these instructions at home:  Check your pain level as told by your health care provider.  Take over-the-counter and prescription medicines only as told by your health care provider.  If you are taking prescription pain medicine: ? Ask your health care provider about taking a stool softener or laxative to prevent constipation. ? Do not stop taking the medicine suddenly. Talk to your health care provider about how and when to discontinue prescription pain medicine. ? If your pain is severe, do not take more pills than instructed by your health care provider. ? Do not take other over-the-counter pain medicines in addition to this medicine unless told by your health care provider. ? Do not drive or operate heavy machinery while taking prescription pain medicine.  Apply ice or heat as told by your health care provider. These may reduce swelling and pain.  Ask your health care provider if other strategies such as distraction, relaxation, or physical therapies can help your pain.  Keep all follow-up visits as told by your health care provider. This is important. Contact a health care provider if:  You have pain that is not controlled by medicine.  Your pain does not improve or gets worse.  You have side effects from pain medicines, such as vomitingor confusion. Get help right away if:  You have severe pain.  You have trouble breathing.  You lose consciousness.  You have chest pain or pressure that lasts for more  than a few minutes. Along with the chest pain you may: ? Have pain or discomfort in one or both arms, your back, neck, jaw, or stomach. ? Have shortness of breath. ? Break out in a cold sweat. ? Feel nauseous. ? Become light-headed. These symptoms may represent a serious problem that is an emergency. Do not wait to see if the symptoms will go away. Get medical help right away. Call your local emergency services (911 in the [place] Do not drive yourself to the hospital. This information is not intended to replace advice given to you by your health care provider. Make sure you discuss any questions you have with your health care provider. Document Released: 01/19/2015 Document Revised: 06/13/2015 Document Reviewed: 01/19/2015 Elsevier Interactive Patient Education  2018 [employer].  Back Exercises If you have pain in your back, do these exercises 2-3 times each day or as told by your doctor. When the pain goes away, do the exercises once each day, but repeat the steps more times for each exercise (do more repetitions). If you do not have pain in your back, do these exercises once each day or as told by your doctor. Exercises Single Knee to Chest  Do these steps 3-5 times in a row for each leg: 1. Lie on your back on a firm bed or the floor with your legs stretched out. 2. Bring one knee to your chest. 3. Hold your knee to your chest by grabbing your knee or thigh. 4. Pull on  your knee until you feel a gentle stretch in your lower back. 5. Keep doing the stretch for 10-30 seconds. 6. Slowly let go of your leg and straighten it.  Pelvic Tilt  Do these steps 5-10 times in a row: 1. Lie on your back on a firm bed or the floor with your legs stretched out. 2. Bend your knees so they point up to the ceiling. Your feet should be flat on the floor. 3. Tighten your lower belly (abdomen) muscles to press your lower back against the floor. This will make your tailbone point up to the ceiling  instead of pointing down to your feet or the floor. 4. Stay in this position for 5-10 seconds while you gently tighten your muscles and breathe evenly.  Cat-Cow  Do these steps until your lower back bends more easily: 1. Get on your hands and knees on a firm surface. Keep your hands under your shoulders, and keep your knees under your hips. You may put padding under your knees. 2. Let your head hang down, and make your tailbone point down to the floor so your lower back is round like the back of a cat. 3. Stay in this position for 5 seconds. 4. Slowly lift your head and make your tailbone point up to the ceiling so your back hangs low (sags) like the back of a cow. 5. Stay in this position for 5 seconds.  Press-Ups  Do these steps 5-10 times in a row: 1. Lie on your belly (face-down) on the floor. 2. Place your hands near your head, about shoulder-width apart. 3. While you keep your back relaxed and keep your hips on the floor, slowly straighten your arms to raise the top half of your body and lift your shoulders. Do not use your back muscles. To make yourself more comfortable, you may change where you place your hands. 4. Stay in this position for 5 seconds. 5. Slowly return to lying flat on the floor.  Bridges  Do these steps 10 times in a row: 1. Lie on your back on a firm surface. 2. Bend your knees so they point up to the ceiling. Your feet should be flat on the floor. 3. Tighten your butt muscles and lift your butt off of the floor until your waist is almost as high as your knees. If you do not feel the muscles working in your butt and the back of your thighs, slide your feet 1-2 inches farther away from your butt. 4. Stay in this position for 3-5 seconds. 5. Slowly lower your butt to the floor, and let your butt muscles relax.  If this exercise is too easy, try doing it with your arms crossed over your chest. Belly Crunches  Do these steps 5-10 times in a row: 1. Lie on your  back on a firm bed or the floor with your legs stretched out. 2. Bend your knees so they point up to the ceiling. Your feet should be flat on the floor. 3. Cross your arms over your chest. 4. Tip your chin a little bit toward your chest but do not bend your neck. 5. Tighten your belly muscles and slowly raise your chest just enough to lift your shoulder blades a tiny bit off of the floor. 6. Slowly lower your chest and your head to the floor.  Back Lifts Do these steps 5-10 times in a row: 1. Lie on your belly (face-down) with your arms at your sides, and rest your  forehead on the floor. 2. Tighten the muscles in your legs and your butt. 3. Slowly lift your chest off of the floor while you keep your hips on the floor. Keep the back of your head in line with the curve in your back. Look at the floor while you do this. 4. Stay in this position for 3-5 seconds. 5. Slowly lower your chest and your face to the floor.  Contact a doctor if:  Your back pain gets a lot worse when you do an exercise.  Your back pain does not lessen 2 hours after you exercise. If you have any of these problems, stop doing the exercises. Do not do them again unless your doctor says it is okay. Get help right away if:  You have sudden, very bad back pain. If this happens, stop doing the exercises. Do not do them again unless your doctor says it is okay. This information is not intended to replace advice given to you by your health care provider. Make sure you discuss any questions you have with your health care provider. Document Released: 02/06/2010 Document Revised: 06/12/2015 Document Reviewed: 02/28/2014 Elsevier Interactive Patient Education  2018 [employer]  Back Pain, Adult Back pain is very common. The pain often gets better over time. The cause of back pain is usually not dangerous. Most people can learn to manage their back pain on their own. Follow these instructions at home: Watch your back pain for  any changes. The following actions may help to lessen any pain you are feeling:  Stay active. Start with short walks on flat ground if you can. Try to walk farther each day.  Exercise regularly as told by your doctor. Exercise helps your back heal faster. It also helps avoid future injury by keeping your muscles strong and flexible.  Do not sit, drive, or stand in one place for more than 30 minutes.  Do not stay in bed. Resting more than 1-2 days can slow down your recovery.  Be careful when you bend or lift an object. Use good form when lifting: ? Bend at your knees. ? Keep the object close to your body. ? Do not twist.  Sleep on a firm mattress. Lie on your side, and bend your knees. If you lie on your back, put a pillow under your knees.  Take medicines only as told by your doctor.  Put ice on the injured area. ? Put ice in a plastic bag. ? Place a towel between your skin and the bag. ? Leave the ice on for 20 minutes, 2-3 times a day for the first 2-3 days. After that, you can switch between ice and heat packs.  Avoid feeling anxious or stressed. Find good ways to deal with stress, such as exercise.  Maintain a healthy weight. Extra weight puts stress on your back.  Contact a doctor if:  You have pain that does not go away with rest or medicine.  You have worsening pain that goes down into your legs or buttocks.  You have pain that does not get better in one week.  You have pain at night.  You lose weight.  You have a fever or chills. Get help right away if:  You cannot control when you poop (bowel movement) or pee (urinate).  Your arms or legs feel weak.  Your arms or legs lose feeling (numbness).  You feel sick to your stomach (nauseous) or throw up (vomit).  You have belly (abdominal) pain.  You  feel like you may pass out (faint). This information is not intended to replace advice given to you by your health care provider. Make sure you discuss any  questions you have with your health care provider. Document Released: 06/23/2007 Document Revised: 06/12/2015 Document Reviewed: 05/08/2013 Elsevier Interactive Patient Education  [employer].

## 2017-05-21 LAB — CMP14+EGFR
A/G RATIO: 1.8 (ref 1.2–2.2)
ALBUMIN: 4.5 g/dL (ref 3.5–5.5)
ALK PHOS: 118 IU/L — AB (ref 39–117)
ALT: 48 IU/L — ABNORMAL HIGH (ref 0–44)
AST: 36 IU/L (ref 0–40)
BUN/Creatinine Ratio: 13 (ref 9–20)
BUN: 11 mg/dL (ref 6–24)
CHLORIDE: 99 mmol/L (ref 96–106)
CO2: 27 mmol/L (ref 20–29)
Calcium: 9.9 mg/dL (ref 8.7–10.2)
Creatinine, Ser: 0.82 mg/dL (ref 0.76–1.27)
GFR calc Af Amer: 118 mL/min/{1.73_m2} (ref >59)
GFR calc non Af Amer: 102 mL/min/{1.73_m2} (ref >59)
GLUCOSE: 156 mg/dL — AB (ref 65–99)
Globulin, Total: 2.5 g/dL (ref 1.5–4.5)
POTASSIUM: 4.3 mmol/L (ref 3.5–5.2)
SODIUM: 142 mmol/L (ref 134–144)
Total Protein: 7 g/dL (ref 6.0–8.5)

## 2017-05-21 LAB — LIPID PANEL
CHOLESTEROL TOTAL: 242 mg/dL — AB (ref 100–199)
Chol/HDL Ratio: 6.7 ratio — ABNORMAL HIGH (ref 0.0–5.0)
HDL: 36 mg/dL — ABNORMAL LOW (ref >39)
LDL Calculated: 157 mg/dL — ABNORMAL HIGH (ref 0–99)
Triglycerides: 244 mg/dL — ABNORMAL HIGH (ref 0–149)
VLDL Cholesterol Cal: 49 mg/dL — ABNORMAL HIGH (ref 5–40)

## 2017-05-21 LAB — CBC
HEMOGLOBIN: 13.6 g/dL (ref 13.0–17.7)
Hematocrit: 41.5 % (ref 37.5–51.0)
MCH: 28.5 pg (ref 26.6–33.0)
MCHC: 32.8 g/dL (ref 31.5–35.7)
MCV: 87 fL (ref 79–97)
Platelets: 275 10*3/uL (ref 150–379)
RBC: 4.77 x10E6/uL (ref 4.14–5.80)
RDW: 14.7 % (ref 12.3–15.4)
WBC: 7 10*3/uL (ref 3.4–10.8)

## 2017-05-21 LAB — URIC ACID: URIC ACID: 7.6 mg/dL (ref 3.7–8.6)

## 2017-05-23 ENCOUNTER — Other Ambulatory Visit: Payer: Self-pay | Admitting: Nurse Practitioner

## 2017-05-23 ENCOUNTER — Ambulatory Visit: Payer: Self-pay | Attending: Nurse Practitioner

## 2017-05-23 MED ORDER — ATORVASTATIN CALCIUM 40 MG PO TABS: 40.0000 mg | ORAL_TABLET | Freq: Every day | ORAL | 3 refills | Status: DC

## 2017-05-23 MED ORDER — ALLOPURINOL 100 MG PO TABS
200.0000 mg | ORAL_TABLET | Freq: Every day | ORAL | 5 refills | Status: DC
Start: 2017-05-23 — End: 2017-12-09

## 2017-05-24 MED FILL — ALLOPURINOL 100 MG TABLET: 100 | 30 days supply | Qty: 60 | Fill #0

## 2017-05-24 MED FILL — ATORVASTATIN CALCIUM 40 MG: 40 | 30 days supply | Qty: 30 | Fill #0

## 2017-05-26 ENCOUNTER — Telehealth: Payer: Self-pay | Admitting: Nurse Practitioner

## 2017-05-26 ENCOUNTER — Telehealth: Payer: Self-pay

## 2017-05-26 NOTE — Telephone Encounter (Signed)
CMA called patient back and address his concerns.

## 2017-05-26 NOTE — Telephone Encounter (Signed)
Patient called wanting lab results. Patient was informed of lab results but he has further questions.   Please follow up

## 2017-05-26 NOTE — Telephone Encounter (Signed)
CMA attempt to call patient to inform on lab results and new Rx.  No answer and left a VM for patient to call back.  If patient call back, please inform:  Uric acid level is elevated. Will need to increase allopurinol  daily. Tests show increased cholesterol/lipid levels. Will need to start on statin or cholesterol/lipid lowering medication. Prescription has been sent to the pharmacy. Patient should work on a low fat, heart healthy diet and participate in regular aerobic exercise program to control as well by working out at least 150 minutes per week. No fried foods. No junk foods, sodas, sugary drinks, unhealthy snacking, or smoking.

## 2017-05-26 NOTE — Telephone Encounter (Signed)
-----   Message from Claiborne Rigg, NP sent at 05/23/2017  9:49 PM EDT ----- Uric acid level is elevated. Will need to increase allopurinol  daily. Tests show increased cholesterol/lipid levels. Will need to start on statin or cholesterol/lipid lowering medication. Prescription has been sent to the pharmacy. Patient should work on a low fat, heart healthy diet and participate in regular aerobic exercise program to control as well by working out at least 150 minutes per week. No fried foods. No junk foods, sodas, sugary drinks, unhealthy snacking, or smoking.

## 2017-06-01 ENCOUNTER — Ambulatory Visit: Payer: Self-pay | Attending: Family Medicine

## 2017-06-17 MED FILL — LISINOPRIL-HCTZ 10-12.5 MG: 10-12.5 | 30 days supply | Qty: 30 | Fill #1

## 2017-06-28 ENCOUNTER — Ambulatory Visit: Payer: Self-pay | Attending: Nurse Practitioner | Admitting: Nurse Practitioner

## 2017-06-28 ENCOUNTER — Encounter: Payer: Self-pay | Admitting: Nurse Practitioner

## 2017-06-28 VITALS — BP 137/86 | HR 86 | Temp 98.3°F | Ht 68.0 in | Wt 261.0 lb

## 2017-06-28 DIAGNOSIS — K219 Gastro-esophageal reflux disease without esophagitis: Secondary | ICD-10-CM | POA: Insufficient documentation

## 2017-06-28 DIAGNOSIS — E669 Obesity, unspecified: Secondary | ICD-10-CM | POA: Insufficient documentation

## 2017-06-28 DIAGNOSIS — Z6839 Body mass index (BMI) 39.0-39.9, adult: Secondary | ICD-10-CM | POA: Insufficient documentation

## 2017-06-28 DIAGNOSIS — E7800 Pure hypercholesterolemia, unspecified: Secondary | ICD-10-CM | POA: Insufficient documentation

## 2017-06-28 DIAGNOSIS — M5442 Lumbago with sciatica, left side: Secondary | ICD-10-CM

## 2017-06-28 DIAGNOSIS — M5441 Lumbago with sciatica, right side: Secondary | ICD-10-CM

## 2017-06-28 DIAGNOSIS — Z8249 Family history of ischemic heart disease and other diseases of the circulatory system: Secondary | ICD-10-CM | POA: Insufficient documentation

## 2017-06-28 DIAGNOSIS — I10 Essential (primary) hypertension: Secondary | ICD-10-CM | POA: Insufficient documentation

## 2017-06-28 DIAGNOSIS — Z823 Family history of stroke: Secondary | ICD-10-CM | POA: Insufficient documentation

## 2017-06-28 DIAGNOSIS — S22000A Wedge compression fracture of unspecified thoracic vertebra, initial encounter for closed fracture: Secondary | ICD-10-CM

## 2017-06-28 DIAGNOSIS — M109 Gout, unspecified: Secondary | ICD-10-CM | POA: Insufficient documentation

## 2017-06-28 DIAGNOSIS — Z8379 Family history of other diseases of the digestive system: Secondary | ICD-10-CM | POA: Insufficient documentation

## 2017-06-28 DIAGNOSIS — R296 Repeated falls: Secondary | ICD-10-CM | POA: Insufficient documentation

## 2017-06-28 DIAGNOSIS — G8929 Other chronic pain: Secondary | ICD-10-CM

## 2017-06-28 DIAGNOSIS — Z79899 Other long term (current) drug therapy: Secondary | ICD-10-CM | POA: Insufficient documentation

## 2017-06-28 DIAGNOSIS — X58XXXA Exposure to other specified factors, initial encounter: Secondary | ICD-10-CM | POA: Insufficient documentation

## 2017-06-28 DIAGNOSIS — S22089A Unspecified fracture of T11-T12 vertebra, initial encounter for closed fracture: Secondary | ICD-10-CM | POA: Insufficient documentation

## 2017-06-28 MED FILL — ALLOPURINOL 100 MG TABLET: 100 | 30 days supply | Qty: 60 | Fill #1

## 2017-06-28 MED FILL — ?ATORVASTATIN 40MG TABLET: 40 | 30 days supply | Qty: 30 | Fill #1

## 2017-06-28 NOTE — Patient Instructions (Addendum)
Sciatica Sciatica is pain, numbness, weakness, or tingling along your sciatic nerve. The sciatic nerve starts in the lower back and goes down the back of each leg. Sciatica happens when this nerve is pinched or has pressure put on it. Sciatica usually goes away on its own or with treatment. Sometimes, sciatica may keep coming back (recur). Follow these instructions at home: Medicines  Take over-the-counter and prescription medicines only as told by your doctor.  Do not drive or use heavy machinery while taking prescription pain medicine. Managing pain  If directed, put ice on the affected area. ? Put ice in a plastic bag. ? Place a towel between your skin and the bag. ? Leave the ice on for 20 minutes, 2-3 times a day.  After icing, apply heat to the affected area before you exercise or as often as told by your doctor. Use the heat source that your doctor tells you to use, such as a moist heat pack or a heating pad. ? Place a towel between your skin and the heat source. ? Leave the heat on for 20-30 minutes. ? Remove the heat if your skin turns bright red. This is especially important if you are unable to feel pain, heat, or cold. You may have a greater risk of getting burned. Activity  Return to your normal activities as told by your doctor. Ask your doctor what activities are safe for you. ? Avoid activities that make your sciatica worse.  Take short rests during the day. Rest in a lying or standing position. This is usually better than sitting to rest. ? When you rest for a long time, do some physical activity or stretching between periods of rest. ? Avoid sitting for a long time without moving. Get up and move around at least one time each hour.  Exercise and stretch regularly, as told by your doctor.  Do not lift anything that is heavier than 10 lb (4.5 kg) while you have symptoms of sciatica. ? Avoid lifting heavy things even when you do not have symptoms. ? Avoid lifting heavy  things over and over.  When you lift objects, always lift in a way that is safe for your body. To do this, you should: ? Bend your knees. ? Keep the object close to your body. ? Avoid twisting. General instructions  Use good posture. ? Avoid leaning forward when you are sitting. ? Avoid hunching over when you are standing.  Stay at a healthy weight.  Wear comfortable shoes that support your feet. Avoid wearing high heels.  Avoid sleeping on a mattress that is too soft or too hard. You might have less pain if you sleep on a mattress that is firm enough to support your back.  Keep all follow-up visits as told by your doctor. This is important. Contact a doctor if:  You have pain that: ? Wakes you up when you are sleeping. ? Gets worse when you lie down. ? Is worse than the pain you have had in the past. ? Lasts longer than 4 weeks.  You lose weight for without trying. Get help right away if:  You cannot control when you pee (urinate) or poop (have a bowel movement).  You have weakness in any of these areas and it gets worse. ? Lower back. ? Lower belly (pelvis). ? Butt (buttocks). ? Legs.  You have redness or swelling of your back.  You have a burning feeling when you pee. This information is not intended to replace  advice given to you by your health care provider. Make sure you discuss any questions you have with your health care provider. Document Released: 10/14/2007 Document Revised: 06/12/2015 Document Reviewed: 09/13/2014 Elsevier Interactive Patient Education  2018 [employer].  Spinal Compression Fracture A spinal compression fracture is a collapse of the bones that form the spine (vertebrae). With this type of fracture, the vertebrae become squashed (compressed) into a wedge shape. Most compression fractures happen in the middle or lower part of the spine. What are the causes? This condition may be caused by:  Thinning and loss of density in the bones  (osteoporosis). This is the most common cause.  A fall.  A car or motorcycle accident.  Cancer.  Trauma, such as a heavy, direct hit to the head.  What increases the risk? You may be at greater risk for a spinal compression fracture if you:  Are 25 years old or older.  Have osteoporosis.  Have certain types of cancer, including: ? Multiple myeloma. ? Lymphoma. ? Prostate cancer. ? Lung cancer. ? Breast cancer.  What are the signs or symptoms? Symptoms of this condition include:  Severe pain.  Pain that gets worse over time.  Pain that is worse when you stand, walk, sit, or bend.  Sudden pain that is so bad that it is hard for you to move.  Bending or humping of the spine.  Gradual loss of height.  Numbness, tingling, or weakness in the back and legs.  Trouble walking.  Your symptoms will depend on the cause of the fracture and how quickly it develops. For example, fractures that are caused by osteoporosis can cause few symptoms, no symptoms, or symptoms that develop slowly over time. How is this diagnosed? This condition may be diagnosed based on symptoms, medical history, and a physical exam. During the physical exam, your health care provider may tap along the length of your spine to check for tenderness. Tests may be done to confirm the diagnosis. They may include:  A bone density test to check for osteoporosis.  Imaging tests, such as a spine X-ray, a CT scan, or MRI.  How is this treated? Treatment for this condition depends on the cause and severity of the condition.Some fractures, such as those that are caused by osteoporosis, may heal on their own with supportive care. This may include:  Pain medicine.  Rest.  A back brace.  Physical therapy exercises.  Medicine that reduces bone pain.  Calcium and vitamin D supplements.  Fractures that cause the back to become misshapen, cause nerve pain or weakness, or do not respond to other treatment may  be treated with a surgical procedure, such as:  Vertebroplasty. In this procedure, bone cement is injected into the collapsed vertebrae to stabilize them.  Balloon kyphoplasty. In this procedure, the collapsed vertebrae are expanded with a balloon and then bone cement is injected into them.  Spinal fusion. In this procedure, the collapsed vertebrae are connected (fused) to normal vertebrae.  Follow these instructions at home: General instructions  Take medicines only as directed by your health care provider.  Do not drive or operate heavy machinery while taking pain medicine.  If directed, apply ice to the injured area: ? Put ice in a plastic bag. ? Place a towel between your skin and the bag. ? Leave the ice on for 30 minutes every two hours at first. Then apply the ice as needed.  Wear your neck brace or back brace as directed by your  health care provider.  Do not drink alcohol. Alcohol can interfere with your treatment.  Keep all follow-up visits as directed by your health care provider. This is important. It can help to prevent permanent injury, disability, and long-lasting (chronic) pain. Activity  Stay in bed (on bed rest) only as directed by your health care provider. Being on bed rest for too long can make your condition worse.  Return to your normal activities as directed by your health care provider. Ask what activities are safe for you.  Do exercises to improve motion and strength in your back (physical therapy), as recommended by your health care provider.  Exercise regularly as directed by your health care provider. Contact a health care provider if:  You have a fever.  You develop a cough that makes your pain worse.  Your pain medicine is not helping.  Your pain does not get better over time.  You cannot return to your normal activities as planned or expected. Get help right away if:  Your pain is very bad and it suddenly gets worse.  You are unable to  move any body part (paralysis) that is below the level of your injury.  You have numbness, tingling, or weakness in any body part that is below the level of your injury.  You cannot control your bladder or bowels. This information is not intended to replace advice given to you by your health care provider. Make sure you discuss any questions you have with your health care provider. Document Released: 01/04/2005 Document Revised: 09/02/2015 Document Reviewed: 01/08/2014 Elsevier Interactive Patient Education  [employer].

## 2017-06-28 NOTE — Progress Notes (Signed)
Assessment & Plan:  Christopher Hurst was seen today for follow-up.  Diagnoses and all orders for this visit:  Chronic midline low back pain with bilateral sciatica -     Ambulatory referral to Orthopedic Surgery Work on losing weight to help reduce back pain. May alternate with heat and ice application for pain relief. May also alternate with acetaminophen as prescribed for back pain. Other alternatives include massage, acupuncture and water aerobics.  You must stay active and avoid a sedentary lifestyle.   Closed compression fracture of thoracic vertebra, initial encounter [facility]) -     CT THORACIC SPINE W WO CONTRAST; Future -     Ambulatory referral to Orthopedic Surgery  Patient has been counseled on age-appropriate routine health concerns for screening and prevention. These are reviewed and up-to-date. Referrals have been placed accordingly. Immunizations are up-to-date or declined.    Subjective:   Chief Complaint  Patient presents with  . Follow-up    Pt. is here for a follow-up on his back pain. Pt. stated it is still the same.    HPI Christopher Hurst. 52 y.o. male presents to office today for follow up to back pain. He reports multiple falls since his last visit  Chronic Low back pain with bilateral sciatica Symptoms have been worsening over the past several months. Lumbar xray showing T10-T11 compression fractures with diffuse degenerative changes in the lumbar spine and hips on 04-13-2017.  He has been taking voltaren 75mg  BID and methocarbamol 500mg  BID since his ED visit on 05-11-2017 with minimal relief of symptoms. He is using a cane for ambulation and endorses increased falls over the past few weeks as well as increasing difficulty making it to the bathroom on time although he denies any involuntary loss of bowel or bladder. Pain radiates from mid thoracic area to bilateral hips and thighs.    Review of Systems  Constitutional: Negative for fever, malaise/fatigue and weight loss.   HENT: Negative.  Negative for nosebleeds.   Eyes: Negative.  Negative for blurred vision, double vision and photophobia.  Respiratory: Negative.  Negative for cough and shortness of breath.   Cardiovascular: Negative.  Negative for chest pain, palpitations and leg swelling.  Gastrointestinal: Positive for heartburn. Negative for nausea and vomiting.  Musculoskeletal: Positive for back pain, falls and joint pain (bilateral knees and right shoulder). Negative for myalgias.  Neurological: Positive for sensory change. Negative for dizziness, focal weakness, seizures and headaches.  Psychiatric/Behavioral: Negative.  Negative for suicidal ideas.    Past Medical History:  Diagnosis Date  . Acid reflux   . Gout   . High cholesterol   . Hypertension   . Obesity     Past Surgical History:  Procedure Laterality Date  . CYSTECTOMY     testicle   . KNEE ARTHROSCOPY     left two knee surgery   . SHOULDER ARTHROSCOPY     one left shoulder   . TONSILLECTOMY      Family History  Problem Relation Age of Onset  . Cirrhosis Father   . Stroke Father   . Hypertension Brother     Social History Reviewed with no changes to be made today.   Outpatient Medications Prior to Visit  Medication Sig Dispense Refill  . atorvastatin (LIPITOR) 40 MG tablet Take 1 tablet (40 mg total) by mouth daily. 90 tablet 3  . diclofenac (VOLTAREN) 75 MG EC tablet Take 1 tablet (75 mg total) by mouth 2 (two) times daily. 60 tablet 1  .  lisinopril-hydrochlorothiazide (ZESTORETIC) 10-12.5 MG tablet Take 1 tablet by mouth daily. 90 tablet 1  . Multiple Vitamin (MULTIVITAMIN WITH MINERALS) TABS tablet Take 1 tablet by mouth daily.    Marland Kitchen. acetaminophen (TYLENOL) 500 MG tablet Take 1 tablet (500 mg total) by mouth every 6 (six) hours as needed for mild pain. (Patient not taking: Reported on 05/20/2017) 30 tablet 0  . allopurinol (ZYLOPRIM) 100 MG tablet Take 2 tablets (200 mg total) by mouth daily. 60 tablet 5  . Flaxseed,  Linseed, (FLAXSEED OIL PO) Take 1 tablet by mouth daily.    Marland Kitchen. omeprazole (PRILOSEC) 20 MG capsule Take 1 capsule (20 mg total) by mouth daily. 30 capsule 0   No facility-administered medications prior to visit.     No Known Allergies     Objective:    BP 137/86 (BP Location: Right Arm, Patient Position: Sitting, Cuff Size: Large)   Pulse 86   Temp 98.3 F (36.8 C) (Oral)   Ht 5\' 8"  (1.727 m)   Wt 261 lb (118.4 kg)   SpO2 95%   BMI 39.68 kg/m  Wt Readings from Last 3 Encounters:  06/28/17 261 lb (118.4 kg)  05/20/17 257 lb 3.2 oz (116.7 kg)  04/13/17 253 lb 9.6 oz (115 kg)    Physical Exam  Constitutional: He is oriented to person, place, and time. He appears well-developed and well-nourished. He is cooperative.  HENT:  Head: Normocephalic and atraumatic.  Eyes: EOM are normal.  Neck: Normal range of motion.  Cardiovascular: Normal rate, regular rhythm and normal heart sounds. Exam reveals no gallop and no friction rub.  No murmur heard. Pulmonary/Chest: Effort normal and breath sounds normal. No tachypnea. No respiratory distress. He has no decreased breath sounds. He has no wheezes. He has no rhonchi. He has no rales. He exhibits no tenderness.  Abdominal: Bowel sounds are normal.  Musculoskeletal: He exhibits tenderness. He exhibits no edema.       Thoracic back: He exhibits tenderness and pain. He exhibits no swelling and no edema.       Back:  Neurological: He is alert and oriented to person, place, and time. Gait (using a cane for mobility. Stooped posture due to back pain) abnormal. Coordination normal.  Skin: Skin is warm and dry.  Psychiatric: He has a normal mood and affect. His behavior is normal. Judgment and thought content normal.  Nursing note and vitals reviewed.     Patient has been counseled extensively about nutrition and exercise as well as the importance of adherence with medications and regular follow-up. The patient was given clear instructions to  go to ER or return to medical center if symptoms don't improve, worsen or new problems develop. The patient verbalized understanding.   Follow-up: Return in about 8 weeks (around 08/23/2017).   Claiborne RiggZelda W Anyely Cunning, FNP-BC [facility] [place], [place] [phone]   06/28/2017, 3:31 PM

## 2017-07-04 ENCOUNTER — Ambulatory Visit (HOSPITAL_COMMUNITY)
Admission: RE | Admit: 2017-07-04 | Discharge: 2017-07-04 | Disposition: A | Discharge status: Home / Self Care | Payer: Self-pay | Source: Ambulatory Visit | Attending: Nurse Practitioner | Admitting: Nurse Practitioner

## 2017-07-04 DIAGNOSIS — M50321 Other cervical disc degeneration at C4-C5 level: Secondary | ICD-10-CM | POA: Insufficient documentation

## 2017-07-04 DIAGNOSIS — M4802 Spinal stenosis, cervical region: Secondary | ICD-10-CM | POA: Insufficient documentation

## 2017-07-04 DIAGNOSIS — S22000A Wedge compression fracture of unspecified thoracic vertebra, initial encounter for closed fracture: Secondary | ICD-10-CM

## 2017-07-04 DIAGNOSIS — M4804 Spinal stenosis, thoracic region: Secondary | ICD-10-CM | POA: Insufficient documentation

## 2017-07-04 MED FILL — DICLOFENAC SOD EC 75 MG TAB: 75 | 30 days supply | Qty: 60 | Fill #1

## 2017-07-05 ENCOUNTER — Other Ambulatory Visit: Payer: Self-pay | Admitting: Nurse Practitioner

## 2017-07-05 DIAGNOSIS — M4802 Spinal stenosis, cervical region: Secondary | ICD-10-CM

## 2017-07-07 ENCOUNTER — Telehealth: Payer: Self-pay

## 2017-07-07 NOTE — Telephone Encounter (Signed)
CMA spoke to patient to inform on CT scan results.  Patient understood.

## 2017-07-07 NOTE — Telephone Encounter (Signed)
-----   Message from Claiborne RiggZelda W Fleming, NP sent at 07/05/2017 11:26 PM EDT ----- CT of spine shows severe stenosis in the neck and thoracic spine. Will refer to spine specialists.

## 2017-07-18 MED FILL — LISINOPRIL-HCTZ 10-12.5 MG: 10-12.5 | 30 days supply | Qty: 30 | Fill #2

## 2017-07-18 MED FILL — ?ATORVASTATIN 40MG TABLET: 40 | 30 days supply | Qty: 30 | Fill #2

## 2017-07-19 ENCOUNTER — Encounter (INDEPENDENT_AMBULATORY_CARE_PROVIDER_SITE_OTHER): Payer: Self-pay | Admitting: Orthopaedic Surgery

## 2017-07-19 ENCOUNTER — Ambulatory Visit (INDEPENDENT_AMBULATORY_CARE_PROVIDER_SITE_OTHER): Payer: Self-pay | Admitting: Orthopaedic Surgery

## 2017-07-19 VITALS — BP 131/87 | HR 90 | Ht 68.0 in | Wt 260.0 lb

## 2017-07-19 DIAGNOSIS — M174 Other bilateral secondary osteoarthritis of knee: Secondary | ICD-10-CM

## 2017-07-19 DIAGNOSIS — M1A9XX0 Chronic gout, unspecified, without tophus (tophi): Secondary | ICD-10-CM

## 2017-07-19 DIAGNOSIS — M47814 Spondylosis without myelopathy or radiculopathy, thoracic region: Secondary | ICD-10-CM

## 2017-07-19 DIAGNOSIS — M4802 Spinal stenosis, cervical region: Secondary | ICD-10-CM

## 2017-07-19 DIAGNOSIS — M1612 Unilateral primary osteoarthritis, left hip: Secondary | ICD-10-CM

## 2017-07-19 MED ORDER — DIAZEPAM 5 MG PO TABS: ORAL_TABLET | ORAL | 0 refills | Status: DC

## 2017-07-19 NOTE — Addendum Note (Signed)
Addended by: Rogers SeedsYEATTS, Laverne Klugh M on: 07/19/2017 10:11 AM   Modules accepted: Orders

## 2017-07-19 NOTE — Progress Notes (Signed)
Office Visit Note/orthopedic consultation   Patient: Christopher Hurst.           Date of Birth: [birth year]           MRN: [id number] Visit Date: 07/19/2017              Requested by: Claiborne Rigg, NP [address] [place], [place] [address] PCP: Claiborne Rigg, NP   Assessment & Plan: Visit Diagnoses:  1. Chronic gout without tophus, unspecified cause, unspecified site   2. Spondylosis of thoracic region without myelopathy or radiculopathy   3. Arthritis of left hip   4. Other bilateral secondary osteoarthritis of knee     Plan: We will obtain a cervical MRI scan with patient's atrophy on the right upper extremity and some bilateral upper extremity weakness.  We reviewed his hip x-rays and CT scan which shows moderate to severe left hip osteoarthritis with bone-on-bone changes.  He does have some moderate knee arthritis worse in the left than right knee.  He currently is on allopurinol 200 mg and likely will be increasing this to 300 mg when he sees his primary care physician next visit.  He has hyperuricemia and may have had long-standing damage to multiple joint areas related to gout.  Office follow-up after cervical MRI scan.  Thank the opportunity to see him in consultation.  Follow-Up Instructions: No follow-ups on file.   Orders:  No orders of the defined types were placed in this encounter.  No orders of the defined types were placed in this encounter.     Procedures: No procedures performed   Clinical Data: No additional findings.   Subjective: Chief Complaint  Patient presents with  . Spine - Pain    HPI 52 year old male here for orthopedic consultation from [facility] after seeing Bertram Denver.  Patient has had history of gout with podagra in the past.  Recent uric acid was 7.6.  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  He has had long-term problems with some knee arthritis, also has severe left hip arthritis and mild right hip arthritis.  He has had pain in his thoracic spine and x-rays and CT scan shows thoracic spondylosis with widespread thoracic facet arthritis arthrosis with moderate neuroforaminal narrowing at multiple levels and some facet degenerative changes at T10-11 which gives him moderate thoracic stenosis.  Partial visualization cervical spine showed foraminal stenosis C4-5 C5-6.  He is noticed weakness in his hands he has had for times when he is fallen since April.  He sleeps in recliner states he can get comfortable.  He is taking Voltaren 75 mg twice a day and is also on allopurinol currently 200 mg started after his elevated uric acid.  He gone for many years without treatment for his gout.  He states he has not had any recent attacks of gout in his feet which usually is where he has the attack.  Review of Systems patient had previous left shoulder arthroscopy and has been told he has some left shoulder arthritis.  Previous left knee scope x2.  Standing x-ray shows moderate joint narrowing and marginal osteophytes consistent with moderate left knee osteoarthritis.  Hyperglycemia noted over the last 2 years vary between 104 and 156.  He has hyperlipidemia.   Objective: Vital Signs: BP 131/87   Pulse 90  Ht 5\' 8"  (1.727 m)   Wt 260 lb (117.9 kg)   BMI 39.53 kg/m   Physical Exam  Constitutional: He is oriented to person, place, and time. He appears well-developed and well-nourished.  HENT:  Head: Normocephalic and atraumatic.  Eyes: Pupils are equal, round, and reactive to light. EOM are normal.  Neck: No tracheal deviation present. No thyromegaly present.  Cardiovascular: Normal rate.  Pulmonary/Chest: Effort normal. He has no wheezes.  Abdominal: Soft. Bowel sounds are normal.  Increased BMI with truncal obesity.  Neurological: He is alert and oriented to person, place, and  time.  Skin: Skin is warm and dry. Capillary refill takes less than 2 seconds.  Psychiatric: He has a normal mood and affect. His behavior is normal. Judgment and thought content normal.    Ortho Exam patient has bilateral brachial plexus tenderness.  No increased pain with cervical compression.  No change with distraction.  Pain with a Spurling worse on the right than left.  Patient has first dorsal interosseous atrophy on the right negative compression test over the ulnar nerve right and left.  Slight weakness in wrist flexion and extension.  Triceps biceps brachioradialis reflexes are 1+ and symmetrical.  He has 1+ knee jerk 1+ ankle jerk.  Patient ambulates with a single-point cane with a slow deliberate short stride gait.  There is 0 degrees internal rotation of his right hip but he denies groin pain with this.  Only 20 degrees external rotation left hip.  Right hip past 30 degrees internal and external rotation without pain.  10 degree hip flexion contracture on the left.  Knees reach full extension no knee effusion.  Collateral ligaments are stable he has pain with palpation more on the left medial joint line and opposite right knee joint line medially.  Crepitus with knee extension worse on the left than right.  Distal pulses palpable no rash over exposed skin.  No lower extremity clonus.  Specialty Comments:  No specialty comments available.  Imaging: No results found.   PMFS History: Patient Active Problem List   Diagnosis Date Noted  . Arthritis of left hip 07/19/2017  . Other bilateral secondary osteoarthritis of knee 07/19/2017  . Spondylosis of thoracic region without myelopathy or radiculopathy 07/19/2017  . Midline low back pain without sciatica 04/13/2017  . Obesity   . Essential hypertension   . High cholesterol   . Gout    Past Medical History:  Diagnosis Date  . Acid reflux   . Gout   . High cholesterol   . Hypertension   . Obesity     Family History  Problem  Relation Age of Onset  . Cirrhosis Father   . Stroke Father   . Hypertension Brother     Past Surgical History:  Procedure Laterality Date  . CYSTECTOMY     testicle   . KNEE ARTHROSCOPY     left two knee surgery   . SHOULDER ARTHROSCOPY     one left shoulder   . TONSILLECTOMY     Social History   Occupational History  . Not on file  Tobacco Use  . Smoking status: Never Smoker  . Smokeless tobacco: Never Used  Substance and Sexual Activity  . Alcohol use: Yes    Comment: rare  . Drug use: No  . Sexual activity: Yes

## 2017-07-20 ENCOUNTER — Telehealth (INDEPENDENT_AMBULATORY_CARE_PROVIDER_SITE_OTHER): Payer: Self-pay | Admitting: Radiology

## 2017-07-20 NOTE — Telephone Encounter (Signed)
DONE. Also schedule MRI review appointment with Dr. Ophelia CharterYates

## 2017-07-20 NOTE — Telephone Encounter (Signed)
noted 

## 2017-07-20 NOTE — Telephone Encounter (Signed)
Attempted to contact patient again,unable to leave message. Voice recording "This caller is unable to receive calls at this time" and hangs up.

## 2017-07-20 NOTE — Telephone Encounter (Signed)
Called to inform patient that [facility]. I was unable to leave message.  Patient has Valium Rx, will need driver. Scheduled for Thursday July 11th at 12pm, [facility] check in at 11:30am. Patient can call [phone] [facility](Central Scheduling)  to change appointment or any questions.  Will try and contact patient again later. today.

## 2017-07-20 NOTE — Telephone Encounter (Signed)
Mr. Christopher Hurst called in to give us his new phone # [phone].  Please call him at this number to discuss his MRI scheduling.

## 2017-07-28 ENCOUNTER — Ambulatory Visit (HOSPITAL_COMMUNITY)
Admission: RE | Admit: 2017-07-28 | Discharge: 2017-07-28 | Disposition: A | Discharge status: Home / Self Care | Payer: Medicaid Other | Source: Ambulatory Visit | Attending: Orthopaedic Surgery | Admitting: Orthopaedic Surgery

## 2017-07-28 DIAGNOSIS — M47812 Spondylosis without myelopathy or radiculopathy, cervical region: Secondary | ICD-10-CM | POA: Insufficient documentation

## 2017-07-28 DIAGNOSIS — M4802 Spinal stenosis, cervical region: Secondary | ICD-10-CM | POA: Insufficient documentation

## 2017-08-05 ENCOUNTER — Ambulatory Visit (INDEPENDENT_AMBULATORY_CARE_PROVIDER_SITE_OTHER): Payer: Self-pay | Admitting: Orthopaedic Surgery

## 2017-08-05 ENCOUNTER — Encounter (INDEPENDENT_AMBULATORY_CARE_PROVIDER_SITE_OTHER): Payer: Self-pay | Admitting: Orthopaedic Surgery

## 2017-08-05 VITALS — BP 127/86 | HR 104 | Ht 68.0 in | Wt 260.0 lb

## 2017-08-05 DIAGNOSIS — M4802 Spinal stenosis, cervical region: Secondary | ICD-10-CM

## 2017-08-05 DIAGNOSIS — M1A9XX0 Chronic gout, unspecified, without tophus (tophi): Secondary | ICD-10-CM

## 2017-08-05 MED FILL — ALLOPURINOL 100 MG TABLET: 100 | 30 days supply | Qty: 60 | Fill #2

## 2017-08-07 ENCOUNTER — Encounter (INDEPENDENT_AMBULATORY_CARE_PROVIDER_SITE_OTHER): Payer: Self-pay | Admitting: Orthopaedic Surgery

## 2017-08-07 DIAGNOSIS — M4802 Spinal stenosis, cervical region: Secondary | ICD-10-CM | POA: Insufficient documentation

## 2017-08-07 NOTE — Progress Notes (Signed)
Office Visit Note   Patient: Christopher JacksRichard Vandeberg Jr.           Date of Birth: March 23, 1965           MRN: [id number] Visit Date: 08/05/2017              Requested by: Claiborne RiggFleming, Zelda W, NP [address] [place], [place] [id number] PCP: Claiborne RiggFleming, Zelda W, NP   Assessment & Plan: Visit Diagnoses:  1. Spinal stenosis of cervical region   2. Chronic gout without tophus, unspecified cause, unspecified site     Plan: Patient can follow-up with PCP for continued treatment of chronic gout.  He does have some moderate stenosis at C4-5 and C5-6 without myelopathic symptoms.  We discussed possible to level cervical fusion for his stenosis.  He has had no recent gout attacks that usually occur in his feet but may have had some damage to his hips and to lesser degree his knees which may be related to gout versus osteoarthritis.  He can return in 1 month for recheck.  Follow-Up Instructions: No follow-ups on file.   Orders:  No orders of the defined types were placed in this encounter.  No orders of the defined types were placed in this encounter.     Procedures: No procedures performed   Clinical Data: No additional findings.   Subjective: Chief Complaint  Patient presents with  . Neck - Follow-up    HPI 52 year old male returns with ongoing problems with neck pain.  He does have gout with uric acid 7.6 on 05/20/2017.  Patient has been taking Voltaren 75 mg twice daily.  Due to his severe neck pain cervical MRI scan has been obtained and is available for review.  Patient does have some mild knee osteoarthritis and more significant left hip osteoarthritis noted on CT scan in the past.  Review of Systems 14 point review of systems updated unchanged from 07/19/2017 office visit.   Objective: Vital Signs: BP 127/86   Pulse (!) 104   Ht 5\' 8"  (1.727 m)   Wt 260 lb (117.9 kg)   BMI 39.53 kg/m   Physical Exam  Constitutional: He is oriented to person, place, and time. He appears  well-developed and well-nourished.  HENT:  Head: Normocephalic and atraumatic.  Eyes: Pupils are equal, round, and reactive to light. EOM are normal.  Neck: No tracheal deviation present. No thyromegaly present.  Cardiovascular: Normal rate.  Pulmonary/Chest: Effort normal. He has no wheezes.  Abdominal: Soft. Bowel sounds are normal.  Neurological: He is alert and oriented to person, place, and time.  Skin: Skin is warm and dry. Capillary refill takes less than 2 seconds.  Psychiatric: He has a normal mood and affect. His behavior is normal. Judgment and thought content normal.    Ortho Exam patient has no increased neck pain with compression no change with distraction.  He has some brachial plexus tenderness.  Ulnar nerve the elbow bilaterally is normal.  No lower extremity clonus no hyperreflexia.  Limitation of range of motion of his right hip was 0 degrees internal rotation 20 degrees external rotation without significant pain.  Mild crepitus with knee extension right and left.  Distal pulses are intact.  Specialty Comments:  No specialty comments available.  Imaging: Study Result   CLINICAL DATA:  Cervical spinal stenosis.  Neck pain, extremity pain  EXAM: MRI CERVICAL SPINE WITHOUT CONTRAST  TECHNIQUE: Multiplanar, multisequence MR imaging of the cervical spine was performed. No intravenous contrast was administered.  COMPARISON:  None.  FINDINGS: Alignment: Normal  Vertebrae: Negative for fracture or mass  Cord: Normal cord signal.  No cord lesion.  Posterior Fossa, vertebral arteries, paraspinal tissues: Negative  Disc levels:  C2-3: Negative  C3-4: Mild spinal stenosis.  Mild disc degeneration and spurring.  C4-5: Moderate spinal stenosis and moderate foraminal encroachment bilaterally. Disc degeneration with diffuse uncinate spurring and bilateral facet degeneration.  C5-6: Moderate spinal stenosis and moderate foraminal  encroachment bilaterally due to disc degeneration and diffuse uncinate spurring. Mild facet degeneration.  C6-7: Mild right foraminal narrowing due to spurring. Mild spinal stenosis.  C7-T1: Negative  IMPRESSION: Mild spinal stenosis C3-4 and C6-7  Moderate spinal stenosis and moderate foraminal stenosis bilaterally C4-5 and C5-6 due to spondylosis.   Electronically Signed   By: Marlan Palau M.D.   On: 07/28/2017 13:46       PMFS History: Patient Active Problem List   Diagnosis Date Noted  . Arthritis of left hip 07/19/2017  . Other bilateral secondary osteoarthritis of knee 07/19/2017  . Spondylosis of thoracic region without myelopathy or radiculopathy 07/19/2017  . Midline low back pain without sciatica 04/13/2017  . Obesity   . Essential hypertension   . High cholesterol   . Gout    Past Medical History:  Diagnosis Date  . Acid reflux   . Gout   . High cholesterol   . Hypertension   . Obesity     Family History  Problem Relation Age of Onset  . Cirrhosis Father   . Stroke Father   . Hypertension Brother     Past Surgical History:  Procedure Laterality Date  . CYSTECTOMY     testicle   . KNEE ARTHROSCOPY     left two knee surgery   . SHOULDER ARTHROSCOPY     one left shoulder   . TONSILLECTOMY     Social History   Occupational History  . Not on file  Tobacco Use  . Smoking status: Never Smoker  . Smokeless tobacco: Never Used  Substance and Sexual Activity  . Alcohol use: Yes    Comment: rare  . Drug use: No  . Sexual activity: Yes

## 2017-08-08 ENCOUNTER — Telehealth: Payer: Self-pay | Admitting: Nurse Practitioner

## 2017-08-08 NOTE — Telephone Encounter (Signed)
Patient called and requested for the pcp assistant to give him a call at her earliest convenience.

## 2017-08-08 NOTE — Telephone Encounter (Signed)
CMA spoke to patient.  Patient recently seen by Dr. Ophelia CharterYates Orthopedic referral, patient stated Dr. Ophelia CharterYates request if patient can have all his lab; cholesterol, and uric acids re-check before he schedule for a neck surgery.

## 2017-08-09 NOTE — Telephone Encounter (Signed)
He had all those labs - a comprehensive metabolic panel, lipid panel, uric acid level in 05/2017

## 2017-08-12 NOTE — Telephone Encounter (Signed)
Patient stated Dr. Ophelia CharterYates would like a new blood work fasting order before they can get the neck surgery done. Pt. Stated Dr. Ophelia CharterYates are aware of of the lab work done on 05/2017.

## 2017-08-15 NOTE — Telephone Encounter (Signed)
Please have patient make a lab appointment for uric acid and fasting lipid panel.

## 2017-08-18 NOTE — Telephone Encounter (Signed)
Patient scheduled for uric acid and fasting lipid on 08/23/2017 before his appt with PCP.

## 2017-08-19 ENCOUNTER — Other Ambulatory Visit: Payer: Self-pay | Admitting: Nurse Practitioner

## 2017-08-19 DIAGNOSIS — M1A9XX0 Chronic gout, unspecified, without tophus (tophi): Secondary | ICD-10-CM

## 2017-08-23 ENCOUNTER — Other Ambulatory Visit: Payer: Self-pay | Admitting: Nurse Practitioner

## 2017-08-23 ENCOUNTER — Encounter: Payer: Self-pay | Admitting: Nurse Practitioner

## 2017-08-23 ENCOUNTER — Ambulatory Visit: Payer: Self-pay

## 2017-08-23 ENCOUNTER — Encounter

## 2017-08-23 ENCOUNTER — Other Ambulatory Visit: Payer: Self-pay

## 2017-08-23 ENCOUNTER — Ambulatory Visit: Payer: Self-pay | Attending: Nurse Practitioner | Admitting: Nurse Practitioner

## 2017-08-23 VITALS — BP 134/85 | HR 75 | Temp 98.2°F | Ht 68.0 in | Wt 260.2 lb

## 2017-08-23 DIAGNOSIS — M1A49X0 Other secondary chronic gout, multiple sites, without tophus (tophi): Secondary | ICD-10-CM

## 2017-08-23 DIAGNOSIS — E7800 Pure hypercholesterolemia, unspecified: Secondary | ICD-10-CM | POA: Insufficient documentation

## 2017-08-23 DIAGNOSIS — Z7901 Long term (current) use of anticoagulants: Secondary | ICD-10-CM | POA: Insufficient documentation

## 2017-08-23 DIAGNOSIS — E669 Obesity, unspecified: Secondary | ICD-10-CM | POA: Insufficient documentation

## 2017-08-23 DIAGNOSIS — Z8249 Family history of ischemic heart disease and other diseases of the circulatory system: Secondary | ICD-10-CM | POA: Insufficient documentation

## 2017-08-23 DIAGNOSIS — Z6839 Body mass index (BMI) 39.0-39.9, adult: Secondary | ICD-10-CM | POA: Insufficient documentation

## 2017-08-23 DIAGNOSIS — I10 Essential (primary) hypertension: Secondary | ICD-10-CM | POA: Insufficient documentation

## 2017-08-23 DIAGNOSIS — M5441 Lumbago with sciatica, right side: Secondary | ICD-10-CM | POA: Insufficient documentation

## 2017-08-23 DIAGNOSIS — Z09 Encounter for follow-up examination after completed treatment for conditions other than malignant neoplasm: Secondary | ICD-10-CM | POA: Insufficient documentation

## 2017-08-23 DIAGNOSIS — M5442 Lumbago with sciatica, left side: Secondary | ICD-10-CM | POA: Insufficient documentation

## 2017-08-23 DIAGNOSIS — Z8379 Family history of other diseases of the digestive system: Secondary | ICD-10-CM | POA: Insufficient documentation

## 2017-08-23 DIAGNOSIS — Z791 Long term (current) use of non-steroidal anti-inflammatories (NSAID): Secondary | ICD-10-CM | POA: Insufficient documentation

## 2017-08-23 DIAGNOSIS — M25511 Pain in right shoulder: Secondary | ICD-10-CM

## 2017-08-23 DIAGNOSIS — M1A9XX0 Chronic gout, unspecified, without tophus (tophi): Secondary | ICD-10-CM

## 2017-08-23 DIAGNOSIS — Z01810 Encounter for preprocedural cardiovascular examination: Secondary | ICD-10-CM | POA: Insufficient documentation

## 2017-08-23 DIAGNOSIS — Z823 Family history of stroke: Secondary | ICD-10-CM | POA: Insufficient documentation

## 2017-08-23 DIAGNOSIS — Z9889 Other specified postprocedural states: Secondary | ICD-10-CM | POA: Insufficient documentation

## 2017-08-23 DIAGNOSIS — K219 Gastro-esophageal reflux disease without esophagitis: Secondary | ICD-10-CM | POA: Insufficient documentation

## 2017-08-23 DIAGNOSIS — M542 Cervicalgia: Secondary | ICD-10-CM | POA: Insufficient documentation

## 2017-08-23 DIAGNOSIS — Z79899 Other long term (current) drug therapy: Secondary | ICD-10-CM | POA: Insufficient documentation

## 2017-08-23 DIAGNOSIS — Z760 Encounter for issue of repeat prescription: Secondary | ICD-10-CM | POA: Insufficient documentation

## 2017-08-23 MED ORDER — DICLOFENAC SODIUM 75 MG PO TBEC: 75.0000 mg | DELAYED_RELEASE_TABLET | Freq: Two times a day (BID) | ORAL | 3 refills | Status: DC

## 2017-08-23 MED FILL — ALLOPURINOL 100 MG TABLET: 100 | 30 days supply | Qty: 60 | Fill #3

## 2017-08-23 MED FILL — DICLOFENAC SOD EC 75 MG TAB: 75 | 30 days supply | Qty: 60 | Fill #0

## 2017-08-23 MED FILL — ?ATORVASTATIN 40MG TABLET: 40 | 30 days supply | Qty: 30 | Fill #3

## 2017-08-23 MED FILL — LISINOPRIL-HCTZ 10-12.5 MG: 10-12.5 | 30 days supply | Qty: 30 | Fill #3

## 2017-08-23 NOTE — Progress Notes (Signed)
Patient here for lab visit only 

## 2017-08-23 NOTE — Progress Notes (Signed)
Assessment & Plan:  Christopher Hurst was seen today for follow-up and medication refill.  Diagnoses and all orders for this visit:  Other secondary chronic gout of multiple sites without tophus URIC ACID  Acute bilateral low back pain with bilateral sciatica -     diclofenac (VOLTAREN) 75 MG EC tablet; Take 1 tablet (75 mg total) by mouth 2 (two) times daily.  Acute pain of right shoulder -     diclofenac (VOLTAREN) 75 MG EC tablet; Take 1 tablet (75 mg total) by mouth 2 (two) times daily.  Encounter for pre-operative cardiovascular clearance EKG  Patient has been counseled on age-appropriate routine health concerns for screening and prevention. These are reviewed and up-to-date. Referrals have been placed accordingly. Immunizations are up-to-date or declined.    Subjective:   Chief Complaint  Patient presents with  . Follow-up    Pt. is here to follow-up back pain.   . Medication Refill   HPI Christopher Hurst. 52 y.o. male presents to office today for follow.   NECK PAIN Since his last visit with me in June he has been evaluated by Orthopedics. He will be possibly scheduled for surgery in the next upcoming months for spinal stenosis of cervical spine.   GOUT He has chronic gout and will need to be medically cleared (Uric Acid <6) prior to surgery. Uric acid is pending today. He endorses medication compliance taking allopurinol 2 tablets daily as well as voltaren 75mg  BID for bilateral shoulder pain R>L and thoracic back pain. He is requesting refills of diclofenac today.    Review of Systems  Constitutional: Negative for fever, malaise/fatigue and weight loss.  HENT: Negative.  Negative for nosebleeds.   Eyes: Negative.  Negative for blurred vision, double vision and photophobia.  Respiratory: Negative.  Negative for cough and shortness of breath.   Cardiovascular: Negative.  Negative for chest pain, palpitations and leg swelling.  Gastrointestinal: Positive for heartburn.  Negative for nausea and vomiting.  Musculoskeletal: Positive for back pain, joint pain and neck pain. Negative for myalgias.  Neurological: Negative.  Negative for dizziness, focal weakness, seizures and headaches.  Psychiatric/Behavioral: Negative.  Negative for suicidal ideas.    Past Medical History:  Diagnosis Date  . Acid reflux   . Gout   . High cholesterol   . Hypertension   . Obesity     Past Surgical History:  Procedure Laterality Date  . CYSTECTOMY     testicle   . KNEE ARTHROSCOPY     left two knee surgery   . SHOULDER ARTHROSCOPY     one left shoulder   . TONSILLECTOMY      Family History  Problem Relation Age of Onset  . Cirrhosis Father   . Stroke Father   . Hypertension Brother     Social History Reviewed with no changes to be made today.   Outpatient Medications Prior to Visit  Medication Sig Dispense Refill  . atorvastatin (LIPITOR) 40 MG tablet Take 1 tablet (40 mg total) by mouth daily. 90 tablet 3  . lisinopril-hydrochlorothiazide (ZESTORETIC) 10-12.5 MG tablet Take 1 tablet by mouth daily. 90 tablet 1  . Multiple Vitamin (MULTIVITAMIN WITH MINERALS) TABS tablet Take 1 tablet by mouth daily.    . diclofenac (VOLTAREN) 75 MG EC tablet Take 1 tablet (75 mg total) by mouth 2 (two) times daily. 60 tablet 1  . acetaminophen (TYLENOL) 500 MG tablet Take 1 tablet (500 mg total) by mouth every 6 (six) hours as needed for  mild pain. (Patient not taking: Reported on 05/20/2017) 30 tablet 0  . allopurinol (ZYLOPRIM) 100 MG tablet Take 2 tablets (200 mg total) by mouth daily. 60 tablet 5  . diazepam (VALIUM) 5 MG tablet Take 2 tablets one hour prior to MRI.  Take third tablet if needed.  Must have driver. (Patient not taking: Reported on 08/23/2017) 3 tablet 0  . Flaxseed, Linseed, (FLAXSEED OIL PO) Take 1 tablet by mouth daily.    Marland Kitchen. omeprazole (PRILOSEC) 20 MG capsule Take 1 capsule (20 mg total) by mouth daily. 30 capsule 0   No facility-administered medications  prior to visit.     No Known Allergies     Objective:    BP 134/85 (BP Location: Right Arm, Patient Position: Sitting, Cuff Size: Large)   Pulse 75   Temp 98.2 F (36.8 C) (Oral)   Ht 5\' 8"  (1.727 m)   Wt 260 lb 3.2 oz (118 kg)   SpO2 96%   BMI 39.56 kg/m  Wt Readings from Last 3 Encounters:  08/23/17 260 lb 3.2 oz (118 kg)  08/05/17 260 lb (117.9 kg)  07/19/17 260 lb (117.9 kg)    Physical Exam  Constitutional: He is oriented to person, place, and time. He appears well-developed and well-nourished. He is cooperative.  HENT:  Head: Normocephalic and atraumatic.  Eyes: EOM are normal.  Neck: Normal range of motion.  Cardiovascular: Normal rate, regular rhythm, normal heart sounds and intact distal pulses. Exam reveals no gallop and no friction rub.  No murmur heard. Pulmonary/Chest: Effort normal and breath sounds normal. No tachypnea. No respiratory distress. He has no decreased breath sounds. He has no wheezes. He has no rhonchi. He has no rales. He exhibits no tenderness.  Abdominal: Bowel sounds are normal.  Musculoskeletal: Normal range of motion. He exhibits no edema.  Neurological: He is alert and oriented to person, place, and time. Gait abnormal. Coordination normal.  Skin: Skin is warm and dry.  Psychiatric: He has a normal mood and affect. His behavior is normal. Judgment and thought content normal.  Nursing note and vitals reviewed.     Patient has been counseled extensively about nutrition and exercise as well as the importance of adherence with medications and regular follow-up. The patient was given clear instructions to go to ER or return to medical center if symptoms don't improve, worsen or new problems develop. The patient verbalized understanding.   Follow-up: Return in about 3 months (around 11/23/2017) for HTN.   Claiborne RiggZelda W Fleming, FNP-BC [facility] [place], [place] [phone]   08/23/2017, 10:50 AM

## 2017-08-24 LAB — LIPID PANEL
Chol/HDL Ratio: 5.3 ratio — ABNORMAL HIGH (ref 0.0–5.0)
Cholesterol, Total: 155 mg/dL (ref 100–199)
HDL: 29 mg/dL — AB (ref >39)
LDL Calculated: 93 mg/dL (ref 0–99)
TRIGLYCERIDES: 163 mg/dL — AB (ref 0–149)
VLDL Cholesterol Cal: 33 mg/dL (ref 5–40)

## 2017-08-24 LAB — URIC ACID: URIC ACID: 3.8 mg/dL (ref 3.7–8.6)

## 2017-08-26 ENCOUNTER — Telehealth: Payer: Self-pay

## 2017-08-26 NOTE — Telephone Encounter (Signed)
CMA spoke to patient to inform on lab results.  Patient understood.  

## 2017-08-26 NOTE — Telephone Encounter (Signed)
-----   Message from Claiborne RiggZelda W Fleming, NP sent at 08/24/2017 10:59 AM EDT ----- Uric acid looks great. Continue on 200mg  of allopurinol. I will recheck in 3 months. I will fax your paperwork to the ortho office for medical clearance. Your cholesterol levels have improved but still not normal. I would recommend continuing on the atorvastatin to help reduce the risk of heart attack or stroke

## 2017-09-07 ENCOUNTER — Other Ambulatory Visit: Payer: Self-pay

## 2017-09-07 ENCOUNTER — Telehealth (INDEPENDENT_AMBULATORY_CARE_PROVIDER_SITE_OTHER): Payer: Self-pay | Admitting: Orthopaedic Surgery

## 2017-09-07 ENCOUNTER — Encounter (INDEPENDENT_AMBULATORY_CARE_PROVIDER_SITE_OTHER): Payer: Self-pay | Admitting: Surgery

## 2017-09-07 ENCOUNTER — Encounter (HOSPITAL_COMMUNITY)
Admission: RE | Admit: 2017-09-07 | Discharge: 2017-09-07 | Disposition: A | Discharge status: Home / Self Care | Payer: Medicaid Other | Source: Ambulatory Visit | Attending: Orthopaedic Surgery | Admitting: Orthopaedic Surgery

## 2017-09-07 ENCOUNTER — Ambulatory Visit (INDEPENDENT_AMBULATORY_CARE_PROVIDER_SITE_OTHER): Payer: Self-pay | Admitting: Surgery

## 2017-09-07 ENCOUNTER — Encounter (HOSPITAL_COMMUNITY): Payer: Self-pay | Admitting: Physician Assistant

## 2017-09-07 ENCOUNTER — Encounter (HOSPITAL_COMMUNITY): Payer: Self-pay

## 2017-09-07 ENCOUNTER — Telehealth: Payer: Self-pay | Admitting: Nurse Practitioner

## 2017-09-07 VITALS — BP 146/92 | HR 75 | Ht 68.0 in | Wt 260.0 lb

## 2017-09-07 DIAGNOSIS — M4802 Spinal stenosis, cervical region: Secondary | ICD-10-CM

## 2017-09-07 DIAGNOSIS — Z01812 Encounter for preprocedural laboratory examination: Secondary | ICD-10-CM | POA: Insufficient documentation

## 2017-09-07 HISTORY — DX: Pneumonia, unspecified organism: J18.9

## 2017-09-07 HISTORY — DX: Unspecified osteoarthritis, unspecified site: M19.90

## 2017-09-07 LAB — CBC
HCT: 41 % (ref 39.0–52.0)
Hemoglobin: 13.1 g/dL (ref 13.0–17.0)
MCH: 28.6 pg (ref 26.0–34.0)
MCHC: 32 g/dL (ref 30.0–36.0)
MCV: 89.5 fL (ref 78.0–100.0)
PLATELETS: 237 10*3/uL (ref 150–400)
RBC: 4.58 MIL/uL (ref 4.22–5.81)
RDW: 13.2 % (ref 11.5–15.5)
WBC: 6.3 10*3/uL (ref 4.0–10.5)

## 2017-09-07 LAB — SURGICAL PCR SCREEN
MRSA, PCR: NEGATIVE
Staphylococcus aureus: NEGATIVE

## 2017-09-07 LAB — BASIC METABOLIC PANEL
Anion gap: 9 (ref 5–15)
BUN: 12 mg/dL (ref 6–20)
CHLORIDE: 102 mmol/L (ref 98–111)
CO2: 27 mmol/L (ref 22–32)
Calcium: 9.3 mg/dL (ref 8.9–10.3)
Creatinine, Ser: 0.83 mg/dL (ref 0.61–1.24)
GFR calc Af Amer: >60 mL/min (ref >60)
GFR calc non Af Amer: >60 mL/min (ref >60)
GLUCOSE: 283 mg/dL — AB (ref 70–99)
Potassium: 3.8 mmol/L (ref 3.5–5.1)
SODIUM: 138 mmol/L (ref 135–145)

## 2017-09-07 LAB — HEMOGLOBIN A1C
Hgb A1c MFr Bld: 8.2 % — ABNORMAL HIGH (ref 4.8–5.6)
Mean Plasma Glucose: 188.64 mg/dL

## 2017-09-07 NOTE — Telephone Encounter (Signed)
[facility] from [facility] came in requesting surgery clearance, it was given to patients nurse, please fax back as soon as it has been completed

## 2017-09-07 NOTE — Progress Notes (Signed)
Pt denies cardiac history, chest pain or sob. Pt states he is not diabetic. 

## 2017-09-07 NOTE — Progress Notes (Signed)
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  We are awaiting medical clearance.  Today full history and physical performed.

## 2017-09-07 NOTE — Telephone Encounter (Signed)
Please see below and advise.

## 2017-09-07 NOTE — Telephone Encounter (Signed)
FYI:    Pt's glucose was 283 today, no known history of diabetes. Spoke with Antionette PolesJames Burns, PA (anesthesiology) and he ordered an A1C. And it was 8.2  New diagnosis of diabetes.  Patient is scheduled for C-spine Fusion on Aug 30th.

## 2017-09-07 NOTE — Telephone Encounter (Signed)
Cancel surgery, I called pt needs A1C under 7.5. [facility] and he will call tomorrow to get started on diabetic treatment . We will reschedule once he is under control. Let Debbie cancel . Thanks

## 2017-09-07 NOTE — Telephone Encounter (Signed)
Will route to PCP 

## 2017-09-07 NOTE — Progress Notes (Signed)
   09/07/17 1315  OBSTRUCTIVE SLEEP APNEA  Have you ever been diagnosed with sleep apnea through a sleep study? No  Do you snore loudly (loud enough to be heard through closed doors)?  0  Do you often feel tired, fatigued, or sleepy during the daytime (such as falling asleep during driving or talking to someone)? 0  Has anyone observed you stop breathing during your sleep? 0  Do you have, or are you being treated for high blood pressure? 1  BMI more than 35 kg/m2? 1  Age > 50 (1-yes) 1  Neck circumference greater than:Male 16 inches or larger, Male 17inches or larger? 1  Male Gender (Yes=1) 1  Obstructive Sleep Apnea Score 5  Score 5 or greater  Results sent to PCP

## 2017-09-07 NOTE — Progress Notes (Signed)
Pt's glucose was 283 today, no known history of diabetes. Spoke with Antionette PolesJames Burns, PA (anesthesiology) and he ordered an A1C.

## 2017-09-07 NOTE — H&P (Signed)
Christopher Elpidio GaleaMorse Jr. is an 52 y.o. male.   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  Progressive worsening symptoms.  Failed conservative treatment.  We are awaiting medical clearance.  Past Medical History:  Diagnosis Date  . Acid reflux   . Gout   . High cholesterol   . Hypertension   . Obesity     Past Surgical History:  Procedure Laterality Date  . CYSTECTOMY     testicle   . KNEE ARTHROSCOPY     left two knee surgery   . SHOULDER ARTHROSCOPY     one left shoulder   . TONSILLECTOMY      Family History  Problem Relation Age of Onset  . Cirrhosis Father   . Stroke Father   . Hypertension Brother    Social History:  reports that he has never smoked. He has never used smokeless tobacco. He reports that he drinks alcohol. He reports that he does not use drugs.  Allergies: No Known Allergies  No medications prior to admission.    No results found for this or any previous visit (from the past 48 hour(s)). No results found.  Review of Systems  Constitutional: Negative.   HENT: Negative.   Respiratory: Negative.   Cardiovascular: Negative.   Gastrointestinal: Positive for heartburn.  Musculoskeletal: Positive for neck pain.  Skin: Negative.   Neurological: Positive for tingling.  Psychiatric/Behavioral: Negative.     There were no vitals taken for this visit. Physical Exam  Constitutional: He is oriented to person, place, and time. No distress.  HENT:  Head: Normocephalic and atraumatic.  Eyes: Pupils are equal, round, and reactive to light. EOM are normal.  Neck: No tracheal deviation present. No thyromegaly present.  Cardiovascular: Normal rate and normal heart sounds.  Respiratory: No respiratory distress.  GI: He exhibits no distension.  Musculoskeletal: He exhibits tenderness.  Neurological: He is alert and oriented to person, place, and  time.  Skin: Skin is warm and dry.  Psychiatric: He has a normal mood and affect.     Assessment/Plan C4-5 and C5-6 HNP/stenosis.  Neck pain and upper extremity radiculopathy   We will proceed with C4-5 and C5-6 ACDF as scheduled.  Started procedure along with potential rehab/recovery time discussed.  All questions answered and wishes to proceed.  Zonia KiefJames Alazay Leicht, PA-C 09/07/2017, 11:07 AM

## 2017-09-07 NOTE — Pre-Procedure Instructions (Signed)
Christopher Elpidio GaleaMorse Jr.  09/07/2017    Your procedure is scheduled on Friday, September 16, 2017 at 7:30 AM.   [facility] Entr[facility] "A" Admitting Office at 5:30 AM.   Call this number if you have problems the morning of surgery: [phone]   Questions prior to day of surgery, please call [phone] between 8 & 4 PM.   Remember:  Do not eat or drink after midnight Thursday, 09/15/17.  Take these medicines the morning of surgery with A SIP OF WATER: Allopurinol (Zyloprim), Omeprazole (Prilosec) - if needed.  Stop NSAIDS (Diclofenac, Ibuprofen, Aleve, etc) and Multivitamins 7 days prior to surgery. Do not use Aspirin products 7 days prior to surgery.    Do not wear jewelry  Do not wear lotions, powders, cologne or deodorant.  Men may shave face and neck.  Do not bring valuables to the hospital.  [facility] is not responsible for any belongings or valuables.  Contacts, dentures or bridgework may not be worn into surgery.  Leave your suitcase in the car.  After surgery it may be brought to your room.  For patients admitted to the hospital, discharge time will be determined by your treatment team.  Patients discharged the day of surgery will not be allowed to drive home.   [facility] - Preparing for Surgery  Before surgery, you can play an important role.  Because skin is not sterile, your skin needs to be as free of germs as possible.  You can reduce the number of germs on you skin by washing with CHG (chlorahexidine gluconate) soap before surgery.  CHG is an antiseptic cleaner which kills germs and bonds with the skin to continue killing germs even after washing.  Oral Hygiene is also important in reducing the risk of infection.  Remember to brush your teeth with your regular toothpaste the morning of surgery.  Please DO NOT use if you have an allergy to CHG or antibacterial soaps.  If your skin becomes reddened/irritated stop using the CHG and inform your nurse when you  arrive at Short Stay.  Do not shave (including legs and underarms) for at least 48 hours prior to the first CHG shower.  You may shave your face.  Please follow these instructions carefully:   1.  Shower with CHG Soap the night before surgery and the morning of Surgery.  2.  If you choose to wash your hair, wash your hair first as usual with your normal shampoo.  3.  After you shampoo, rinse your hair and body thoroughly to remove the shampoo. 4.  Use CHG as you would any other liquid soap.  You can apply chg directly to the skin and wash gently with a      scrungie or washcloth.           5.  Apply the CHG Soap to your body ONLY FROM THE NECK DOWN.   Do not use on open wounds or open sores. Avoid contact with your eyes, ears, mouth and genitals (private parts).  Wash genitals (private parts) with your normal soap.  6.  Wash thoroughly, paying special attention to the area where your surgery will be performed.  7.  Thoroughly rinse your body with warm water from the neck down.  8.  DO NOT shower/wash with your normal soap after using and rinsing off the CHG Soap.  9.  Pat yourself dry with a clean towel.  10.  Wear clean pajamas.            11.  Place clean sheets on your bed the night of your first shower and do not sleep with pets.  Day of Surgery  Shower as above. Do not apply any lotions/deodorants the morning of surgery.   Please wear clean clothes to the hospital. Remember to brush your teeth with toothpaste.   Please read over the fact sheets that you were given.

## 2017-09-08 ENCOUNTER — Telehealth (INDEPENDENT_AMBULATORY_CARE_PROVIDER_SITE_OTHER): Payer: Self-pay | Admitting: Orthopaedic Surgery

## 2017-09-08 NOTE — Telephone Encounter (Signed)
noted 

## 2017-09-08 NOTE — Telephone Encounter (Signed)
I called discussed.  A1C 8.2 , mean glucose 188. He has diabetes and will get Tx including diet change and likely will need medication. Discussed with him in detail [facility]

## 2017-09-08 NOTE — Telephone Encounter (Signed)
Please see below and advise.

## 2017-09-08 NOTE — Telephone Encounter (Signed)
Patient called to let Dr Ophelia CharterYates know that he ate a whopper  [employer]ries and drank two glasses of peach tea at [employer]. Patient said he ate before going to have lab work done. Patient said he has never had problems with his A1C. Patient asked for  A call back as soon as possible. The number to contact patient is [phone]

## 2017-09-08 NOTE — Telephone Encounter (Signed)
Dr. Ophelia CharterYates has spoken with patient again this morning. Please cancel his upcoming surgery. Thanks.

## 2017-09-09 ENCOUNTER — Ambulatory Visit: Payer: Self-pay | Attending: Nurse Practitioner | Admitting: Nurse Practitioner

## 2017-09-09 ENCOUNTER — Encounter: Payer: Self-pay | Admitting: Nurse Practitioner

## 2017-09-09 VITALS — BP 122/87 | HR 95 | Temp 98.6°F | Ht 68.0 in | Wt 259.0 lb

## 2017-09-09 DIAGNOSIS — Z823 Family history of stroke: Secondary | ICD-10-CM | POA: Insufficient documentation

## 2017-09-09 DIAGNOSIS — E7800 Pure hypercholesterolemia, unspecified: Secondary | ICD-10-CM | POA: Insufficient documentation

## 2017-09-09 DIAGNOSIS — Z8379 Family history of other diseases of the digestive system: Secondary | ICD-10-CM | POA: Insufficient documentation

## 2017-09-09 DIAGNOSIS — E119 Type 2 diabetes mellitus without complications: Secondary | ICD-10-CM | POA: Insufficient documentation

## 2017-09-09 DIAGNOSIS — M109 Gout, unspecified: Secondary | ICD-10-CM | POA: Insufficient documentation

## 2017-09-09 DIAGNOSIS — K219 Gastro-esophageal reflux disease without esophagitis: Secondary | ICD-10-CM | POA: Insufficient documentation

## 2017-09-09 DIAGNOSIS — Z79899 Other long term (current) drug therapy: Secondary | ICD-10-CM | POA: Insufficient documentation

## 2017-09-09 DIAGNOSIS — E669 Obesity, unspecified: Secondary | ICD-10-CM | POA: Insufficient documentation

## 2017-09-09 DIAGNOSIS — Z23 Encounter for immunization: Secondary | ICD-10-CM | POA: Insufficient documentation

## 2017-09-09 DIAGNOSIS — Z6839 Body mass index (BMI) 39.0-39.9, adult: Secondary | ICD-10-CM | POA: Insufficient documentation

## 2017-09-09 DIAGNOSIS — Z8249 Family history of ischemic heart disease and other diseases of the circulatory system: Secondary | ICD-10-CM | POA: Insufficient documentation

## 2017-09-09 DIAGNOSIS — I10 Essential (primary) hypertension: Secondary | ICD-10-CM | POA: Insufficient documentation

## 2017-09-09 DIAGNOSIS — Z8701 Personal history of pneumonia (recurrent): Secondary | ICD-10-CM | POA: Insufficient documentation

## 2017-09-09 DIAGNOSIS — M1990 Unspecified osteoarthritis, unspecified site: Secondary | ICD-10-CM | POA: Insufficient documentation

## 2017-09-09 LAB — GLUCOSE, POCT (MANUAL RESULT ENTRY): POC GLUCOSE: 155 mg/dL — AB (ref 70–99)

## 2017-09-09 MED ORDER — TRUEPLUS LANCETS 28G MISC: 3 refills | Status: DC

## 2017-09-09 MED ORDER — GLUCOSE BLOOD VI STRP: ORAL_STRIP | 12 refills | Status: DC

## 2017-09-09 MED ORDER — METFORMIN HCL 500 MG PO TABS: 500.0000 mg | ORAL_TABLET | Freq: Two times a day (BID) | ORAL | 3 refills | Status: DC

## 2017-09-09 MED ORDER — TRUE METRIX METER W/DEVICE KIT: PACK | 0 refills | Status: DC

## 2017-09-09 MED FILL — ?METFORMIN HCL ER 500 MG TA: 500 | 30 days supply | Qty: 60 | Fill #0

## 2017-09-09 MED FILL — TRUE METRIX TEST STRIP: 50 days supply | Qty: 100 | Fill #0

## 2017-09-09 MED FILL — !TRUE METRIX BLOOD GLUCOSE: 365 days supply | Qty: 1 | Fill #0

## 2017-09-09 MED FILL — TRUEplus LANCETS 28G MISC: 50 days supply | Qty: 100 | Fill #0

## 2017-09-09 NOTE — Patient Instructions (Signed)

## 2017-09-09 NOTE — Progress Notes (Signed)
m   Assessment & Plan:  Ryane was seen today for diabetes.  Diagnoses and all orders for this visit:  New onset type 2 diabetes mellitus (HCC) -     Glucose (CBG) -     metFORMIN (GLUCOPHAGE) 500 MG tablet; Take 1 tablet (500 mg total) by mouth 2 (two) times daily with a meal. -     Blood Glucose Monitoring Suppl (TRUE METRIX METER) w/Device KIT; Use as instructed. Check blood glucose levels 2 times per day -     TRUEPLUS LANCETS 28G MISC; Use as instructed. Check blood glucose levels 2 times per day -     glucose blood (TRUE METRIX BLOOD GLUCOSE TEST) test strip; Use as instructed. Check blood glucose levels 2 times per day Continue blood sugar control as discussed in office today, low carbohydrate diet, and regular physical exercise as tolerated, 150 minutes per week (30 min each day, 5 days per week, or 50 min 3 days per week). Keep blood sugar logs with fasting goal of 90-130 mg/dl, post prandial (after you eat) less than 180.  For Hypoglycemia: BS <60 and Hyperglycemia BS >400; contact the clinic ASAP. Annual eye exams and foot exams are recommended.  Need for immunization against influenza -     Flu Vaccine QUAD 36+ mos IM    Patient has been counseled on age-appropriate routine health concerns for screening and prevention. These are reviewed and up-to-date. Referrals have been placed accordingly. Immunizations are up-to-date or declined.    Subjective:   Chief Complaint  Patient presents with  . Diabetes    Pt. is here for diabetes.    HPI Christopher Hurst. 52 y.o. male presents to office today for follow up. He was recently diagnosed with Diabetes mellitus and is here today to discuss diabetes management.   Diabetes Mellitus Type 2 Today we discussed his goal A1c (<7). I have started him on metformin 577m daily. He is to increase his metformin to 507mBID if FBG consistently >130 or Postprandial readings >180. We also discussed weight loss of 10-20lbs as well as  dietary modifications including carb counting. He verbalized understanding. A glucometer was ordered for him today as well as supplies. At this time his cervical surgery is on hold until DM is more controlled.  He denies any hypo or hyperglycemic symptoms. Lab Results  Component Value Date   HGBA1C 8.2 (H) 09/07/2017     Review of Systems  Constitutional: Negative for fever, malaise/fatigue and weight loss.  HENT: Negative.  Negative for nosebleeds.   Eyes: Negative.  Negative for blurred vision, double vision and photophobia.  Respiratory: Negative.  Negative for cough and shortness of breath.   Cardiovascular: Negative.  Negative for chest pain, palpitations and leg swelling.  Gastrointestinal: Negative.  Negative for heartburn, nausea and vomiting.  Musculoskeletal: Positive for back pain, joint pain and neck pain. Negative for myalgias.  Neurological: Negative.  Negative for dizziness, focal weakness, seizures and headaches.  Psychiatric/Behavioral: Negative.  Negative for suicidal ideas.    Past Medical History:  Diagnosis Date  . Acid reflux   . Arthritis   . Diabetes mellitus without complication ([facility]  . Gout   . High cholesterol   . Hypertension   . Obesity   . Pneumonia     Past Surgical History:  Procedure Laterality Date  . CYSTECTOMY     testicle   . KNEE ARTHROSCOPY     left two knee surgery   . SHOULDER ARTHROSCOPY  one left shoulder   . TONSILLECTOMY      Family History  Problem Relation Age of Onset  . Cirrhosis Father   . Stroke Father   . Hypertension Brother     Social History Reviewed with no changes to be made today.   Outpatient Medications Prior to Visit  Medication Sig Dispense Refill  . atorvastatin (LIPITOR) 40 MG tablet Take 1 tablet (40 mg total) by mouth daily. 90 tablet 3  . diclofenac (VOLTAREN) 75 MG EC tablet Take 1 tablet (75 mg total) by mouth 2 (two) times daily. 60 tablet 3  . lisinopril-hydrochlorothiazide (ZESTORETIC)  10-12.5 MG tablet Take 1 tablet by mouth daily. 90 tablet 1  . allopurinol (ZYLOPRIM) 100 MG tablet Take 2 tablets (200 mg total) by mouth daily. 60 tablet 5  . Multiple Vitamin (MULTIVITAMIN WITH MINERALS) TABS tablet Take 1 tablet by mouth daily.    Marland Kitchen omeprazole (PRILOSEC) 20 MG capsule Take 1 capsule (20 mg total) by mouth daily. (Patient taking differently: Take 20 mg by mouth daily as needed (heartburn). ) 30 capsule 0   No facility-administered medications prior to visit.     No Known Allergies     Objective:    BP 122/87 (BP Location: Right Arm, Patient Position: Sitting, Cuff Size: Large)   Pulse 95   Temp 98.6 F (37 C) (Oral)   Ht '5\' 8"'  (1.727 m)   Wt 259 lb (117.5 kg)   SpO2 95%   BMI 39.38 kg/m  Wt Readings from Last 3 Encounters:  09/09/17 259 lb (117.5 kg)  09/07/17 265 lb 3.2 oz (120.3 kg)  09/07/17 260 lb (117.9 kg)    Physical Exam  Constitutional: He is oriented to person, place, and time. He appears well-developed and well-nourished. He is cooperative.  HENT:  Head: Normocephalic and atraumatic.  Eyes: EOM are normal.  Neck: Normal range of motion.  Cardiovascular: Normal rate, regular rhythm and normal heart sounds. Exam reveals no gallop and no friction rub.  No murmur heard. Pulmonary/Chest: Effort normal and breath sounds normal. No tachypnea. No respiratory distress. He has no decreased breath sounds. He has no wheezes. He has no rhonchi. He has no rales. He exhibits no tenderness.  Abdominal: Bowel sounds are normal.  Musculoskeletal: Normal range of motion. He exhibits tenderness. He exhibits no edema or deformity.       Cervical back: He exhibits pain (with passive full ROM).  Neurological: He is alert and oriented to person, place, and time. Coordination normal.  Skin: Skin is warm and dry.  Psychiatric: He has a normal mood and affect. His behavior is normal. Judgment and thought content normal.  Nursing note and vitals reviewed.          Patient has been counseled extensively about nutrition and exercise as well as the importance of adherence with medications and regular follow-up. The patient was given clear instructions to go to ER or return to medical center if symptoms don't improve, worsen or new problems develop. The patient verbalized understanding.   Follow-up: Return in about 3 months (around 12/10/2017) for DM.   Gildardo Pounds, FNP-BC [facility] [place], [place]   09/09/2017, 8:35 PM

## 2017-09-15 NOTE — Telephone Encounter (Signed)
Fax for medical clearance has been sent

## 2017-09-16 ENCOUNTER — Ambulatory Visit: Admit: 2017-09-16 | Payer: Medicaid Other | Admitting: Orthopaedic Surgery

## 2017-09-16 SURGERY — ANTERIOR CERVICAL DECOMPRESSION/DISCECTOMY FUSION 1 LEVEL
Anesthesia: General

## 2017-09-23 ENCOUNTER — Other Ambulatory Visit: Payer: Self-pay | Admitting: *Deleted

## 2017-09-23 DIAGNOSIS — I10 Essential (primary) hypertension: Secondary | ICD-10-CM

## 2017-09-23 MED ORDER — LISINOPRIL-HYDROCHLOROTHIAZIDE 10-12.5 MG PO TABS: 1.0000 | ORAL_TABLET | Freq: Every day | ORAL | 3 refills | Status: DC

## 2017-09-23 NOTE — Telephone Encounter (Signed)
Patient verified DOB Patient was seen 09/09/17. Patient received BP refill to pharmacy in [place] due to no transportation

## 2017-09-27 ENCOUNTER — Inpatient Hospital Stay (INDEPENDENT_AMBULATORY_CARE_PROVIDER_SITE_OTHER): Payer: Self-pay | Admitting: Orthopaedic Surgery

## 2017-10-04 ENCOUNTER — Telehealth: Payer: Self-pay | Admitting: Pharmacist

## 2017-10-04 MED FILL — ?ATORVASTATIN 40MG TABLET: 40 | 30 days supply | Qty: 30 | Fill #4

## 2017-10-04 MED FILL — ?ALLOPURINOL 100MG TAB: 100 | 30 days supply | Qty: 60 | Fill #4

## 2017-10-04 MED FILL — ?METFORMIN HCL 500MG TABS: 500 | 30 days supply | Qty: 60 | Fill #0

## 2017-10-04 MED FILL — DICLOFENAC SOD EC 75 MG TAB: 75 | 30 days supply | Qty: 60 | Fill #1

## 2017-10-04 NOTE — Telephone Encounter (Signed)
Pt to be taking 500 mg BID of metformin regular release per Zelda. Pt received 500 mg XR from pharmacy and reports no side effects. We will fill for 500 mg reg release as originally prescribed. Pt has been made aware and knows to take with meals to help avoid GI side effects.

## 2017-10-05 ENCOUNTER — Other Ambulatory Visit: Payer: Self-pay

## 2017-10-05 DIAGNOSIS — E119 Type 2 diabetes mellitus without complications: Secondary | ICD-10-CM

## 2017-10-05 MED ORDER — GLUCOSE BLOOD VI STRP: ORAL_STRIP | 11 refills | Status: DC

## 2017-10-05 MED FILL — TRUE METRIX GLUCOSE TEST ST: 50 days supply | Qty: 100 | Fill #0

## 2017-10-05 NOTE — Telephone Encounter (Signed)
Resent to pharmacy due to error on their side.

## 2017-10-07 MED FILL — TRUEplus LANCETS 28G MISC: 50 days supply | Qty: 100 | Fill #1

## 2017-10-07 NOTE — Telephone Encounter (Signed)
Agree/Noted. 

## 2017-10-21 MED FILL — LISINOPRIL-HCTZ 10-12.5 MG: 10-12.5 | 30 days supply | Qty: 30 | Fill #4

## 2017-10-25 ENCOUNTER — Other Ambulatory Visit: Payer: Self-pay | Admitting: Nurse Practitioner

## 2017-11-03 DIAGNOSIS — I10 Essential (primary) hypertension: Secondary | ICD-10-CM

## 2017-11-04 MED FILL — ?ATORVASTATIN 40MG TABLET: 40 | 30 days supply | Qty: 30 | Fill #5

## 2017-11-04 MED FILL — ?ALLOPURINOL 100MG TABLET: 100 | 30 days supply | Qty: 60 | Fill #5

## 2017-11-16 MED FILL — LISINOPRIL-HCTZ 10-12.5 MG: 10-12.5 | 30 days supply | Qty: 30 | Fill #5

## 2017-11-16 MED FILL — DICLOFENAC SOD EC 75 MG TAB: 75 | 30 days supply | Qty: 60 | Fill #2

## 2017-11-18 ENCOUNTER — Telehealth: Payer: Self-pay

## 2017-11-18 MED FILL — TRUEplus LANCETS 28G MISC: 50 days supply | Qty: 100 | Fill #2

## 2017-11-18 MED FILL — TRUE METRIX GLUCOSE TEST ST: 50 days supply | Qty: 100 | Fill #1

## 2017-11-18 NOTE — Telephone Encounter (Signed)
Patient came in because he said his metformin bottle instructed him to take one tablet daily instead of twice daily which is in his my chart(I am going to check with the pharmacy and see if that was an error on their part) He reports that his blood sugar levels have been great (between 69-160 including one hypoglycemic event when he hadn't eaten for the majority of the day) he reported positive dietary changes and intended weight loss. He wants to know if he should continue taking one tablet daily or increase to twice daily.

## 2017-11-18 NOTE — Telephone Encounter (Signed)
He should be taking 2 tablets. One tablet twice a day.

## 2017-11-18 NOTE — Telephone Encounter (Signed)
The patient was notified of the proper dosing for his medication, he was concerned about having more 'lows'. We discussed being consistent with meals and snacks to prevent hypoglycemic events. We discussed signs and symptoms of hypoglycemia and what to do when he experiences them, he was advised that glucose tablets, sugary drinks/juice are acceptable only at those times. He was told to call and report those events if has any extreme lows or notices a consistently low trend in his blood sugar.

## 2017-11-23 ENCOUNTER — Ambulatory Visit: Payer: Medicaid Other | Admitting: Nurse Practitioner

## 2017-12-09 ENCOUNTER — Ambulatory Visit: Payer: Self-pay | Attending: Nurse Practitioner | Admitting: Nurse Practitioner

## 2017-12-09 ENCOUNTER — Encounter: Payer: Self-pay | Admitting: Nurse Practitioner

## 2017-12-09 ENCOUNTER — Ambulatory Visit: Payer: Self-pay | Attending: Nurse Practitioner

## 2017-12-09 VITALS — BP 125/84 | HR 86 | Ht 68.0 in | Wt 242.6 lb

## 2017-12-09 DIAGNOSIS — Z1211 Encounter for screening for malignant neoplasm of colon: Secondary | ICD-10-CM | POA: Insufficient documentation

## 2017-12-09 DIAGNOSIS — M5442 Lumbago with sciatica, left side: Secondary | ICD-10-CM | POA: Insufficient documentation

## 2017-12-09 DIAGNOSIS — E669 Obesity, unspecified: Secondary | ICD-10-CM | POA: Insufficient documentation

## 2017-12-09 DIAGNOSIS — E118 Type 2 diabetes mellitus with unspecified complications: Secondary | ICD-10-CM | POA: Insufficient documentation

## 2017-12-09 DIAGNOSIS — Z6836 Body mass index (BMI) 36.0-36.9, adult: Secondary | ICD-10-CM | POA: Insufficient documentation

## 2017-12-09 DIAGNOSIS — I10 Essential (primary) hypertension: Secondary | ICD-10-CM | POA: Insufficient documentation

## 2017-12-09 DIAGNOSIS — E1169 Type 2 diabetes mellitus with other specified complication: Secondary | ICD-10-CM

## 2017-12-09 DIAGNOSIS — Z9889 Other specified postprocedural states: Secondary | ICD-10-CM | POA: Insufficient documentation

## 2017-12-09 DIAGNOSIS — M1A09X0 Idiopathic chronic gout, multiple sites, without tophus (tophi): Secondary | ICD-10-CM

## 2017-12-09 DIAGNOSIS — E782 Mixed hyperlipidemia: Secondary | ICD-10-CM | POA: Insufficient documentation

## 2017-12-09 DIAGNOSIS — Z79899 Other long term (current) drug therapy: Secondary | ICD-10-CM | POA: Insufficient documentation

## 2017-12-09 DIAGNOSIS — M1A9XX0 Chronic gout, unspecified, without tophus (tophi): Secondary | ICD-10-CM | POA: Insufficient documentation

## 2017-12-09 DIAGNOSIS — M1990 Unspecified osteoarthritis, unspecified site: Secondary | ICD-10-CM | POA: Insufficient documentation

## 2017-12-09 DIAGNOSIS — Z7984 Long term (current) use of oral hypoglycemic drugs: Secondary | ICD-10-CM | POA: Insufficient documentation

## 2017-12-09 DIAGNOSIS — M5441 Lumbago with sciatica, right side: Secondary | ICD-10-CM | POA: Insufficient documentation

## 2017-12-09 DIAGNOSIS — K219 Gastro-esophageal reflux disease without esophagitis: Secondary | ICD-10-CM | POA: Insufficient documentation

## 2017-12-09 DIAGNOSIS — Z8249 Family history of ischemic heart disease and other diseases of the circulatory system: Secondary | ICD-10-CM | POA: Insufficient documentation

## 2017-12-09 LAB — POCT GLYCOSYLATED HEMOGLOBIN (HGB A1C): Hemoglobin A1C: 5.9 % — AB (ref 4.0–5.6)

## 2017-12-09 LAB — GLUCOSE, POCT (MANUAL RESULT ENTRY): POC Glucose: 98 mg/dl (ref 70–99)

## 2017-12-09 MED ORDER — GLUCOSE BLOOD VI STRP: ORAL_STRIP | 11 refills | Status: DC

## 2017-12-09 MED ORDER — ALLOPURINOL 100 MG PO TABS: 200.0000 mg | ORAL_TABLET | Freq: Every day | ORAL | 5 refills | Status: DC

## 2017-12-09 MED ORDER — METFORMIN HCL 500 MG PO TABS: 500.0000 mg | ORAL_TABLET | Freq: Two times a day (BID) | ORAL | 3 refills | Status: DC

## 2017-12-09 MED ORDER — ATORVASTATIN CALCIUM 40 MG PO TABS: 40.0000 mg | ORAL_TABLET | Freq: Every day | ORAL | 3 refills | Status: DC

## 2017-12-09 MED ORDER — DICLOFENAC SODIUM 75 MG PO TBEC: 75.0000 mg | DELAYED_RELEASE_TABLET | Freq: Two times a day (BID) | ORAL | 3 refills | Status: DC

## 2017-12-09 MED ORDER — TRUEPLUS LANCETS 28G MISC: 3 refills | Status: DC

## 2017-12-09 MED ORDER — LISINOPRIL-HYDROCHLOROTHIAZIDE 10-12.5 MG PO TABS: 1.0000 | ORAL_TABLET | Freq: Every day | ORAL | 3 refills | Status: DC

## 2017-12-09 MED FILL — ALLOPURINOL 100 MG TABLET: 100 | 30 days supply | Qty: 60 | Fill #0

## 2017-12-09 MED FILL — metFORMIN HCL 500 MG TABS: 500 | 30 days supply | Qty: 60 | Fill #1

## 2017-12-09 MED FILL — DICLOFENAC SOD EC 75 MG TAB: 75 | 30 days supply | Qty: 60 | Fill #0

## 2017-12-09 MED FILL — LISINOPRIL-HCTZ 10-12.5 MG: 10-12.5 | 30 days supply | Qty: 30 | Fill #0

## 2017-12-09 MED FILL — ATORVASTATIN CALCIUM 40 MG: 40 | 30 days supply | Qty: 30 | Fill #6

## 2017-12-09 NOTE — Progress Notes (Signed)
Assessment & Plan:  Christopher Hurst was seen today for follow-up.  Diagnoses and all orders for this visit:  Controlled type 2 diabetes mellitus with complication, without long-term current use of insulin (HCC) -     Glucose (CBG) -     HgB A1c -     metFORMIN (GLUCOPHAGE) 500 MG tablet; Take 1 tablet (500 mg total) by mouth 2 (two) times daily with a meal. -     glucose blood (TRUE METRIX BLOOD GLUCOSE TEST) test strip; Use as instructed. Check blood glucose levels 2 times per day -     TRUEPLUS LANCETS 28G MISC; Use as instructed. Check blood glucose levels 2 times per day  Essential hypertension -     lisinopril-hydrochlorothiazide (ZESTORETIC) 10-12.5 MG tablet; Take 1 tablet by mouth daily. Continue all antihypertensives as prescribed.  Remember to bring in your blood pressure log with you for your follow up appointment.  DASH/Mediterranean Diets are healthier choices for HTN.   Acute bilateral low back pain with bilateral sciatica -     diclofenac (VOLTAREN) 75 MG EC tablet; Take 1 tablet (75 mg total) by mouth 2 (two) times daily. Work on losing weight to help reduce joint pain. May alternate with heat and ice application for pain relief. May also alternate with acetaminophen and Ibuprofen as prescribed pain relief. Other alternatives include massage, acupuncture and water aerobics.  You must stay active and avoid a sedentary lifestyle.  Colon cancer screening -     Fecal occult blood, imunochemical([facility])  Mixed hyperlipidemia -     atorvastatin (LIPITOR) 40 MG tablet; Take 1 tablet (40 mg total) by mouth daily. INSTRUCTIONS: Work on a low fat, heart healthy diet and participate in regular aerobic exercise program by working out at least 150 minutes per week; 5 days a week-30 minutes per day. Avoid red meat, fried foods. junk foods, sodas, sugary drinks, unhealthy snacking, alcohol and smoking.  Drink at least 48oz of water per day and monitor your carbohydrate intake daily.     Chronic gout of multiple sites, unspecified cause -     allopurinol (ZYLOPRIM) 100 MG tablet; Take 2 tablets (200 mg total) by mouth daily.    Patient has been counseled on age-appropriate routine health concerns for screening and prevention. These are reviewed and up-to-date. Referrals have been placed accordingly. Immunizations are up-to-date or declined.    Subjective:   Chief Complaint  Patient presents with  . Follow-up   HPI Christopher Hurst. 52 y.o. male presents to office today for follow up.   DM TYPE 2 He is monitoring his blood glucose levels twice per day. Fasting/Postprandial readings averages 120s. He had noticed a few readings in the 60s however he also states he had not eaten in several hours when he experienced the hypoglycemia. A1c down from 8.2 to 5.9 today. Current medication regimen includes metformin 566m BID. He is overdue for eye exam. He has an appointment with the financial counselor today for financial assistance.  Lab Results  Component Value Date   HGBA1C 5.9 (A) 12/09/2017   Lab Results  Component Value Date   HGBA1C 8.2 (H) 09/07/2017    Essential Hypertension Blood pressure is well controlled today. He endorses medication compliance taking Lisinopril-Hctz 10-12.562mdaily. He has lost 23lbs, exercising (walking) and making dietary modifications to help reduce his blood pressure and A1c. Denies chest pain, shortness of breath, palpitations, lightheadedness, dizziness, headaches or BLE edema.  BP Readings from Last 3 Encounters:  12/09/17 125/84  09/09/17 122/87  09/07/17 (!) 149/86   Hyperlipidemia Patient presents for follow up to hyperlipidemia.  He is medication compliant taking atorvastatin 41m daily. He is diet compliant and denies exertional chest pressure/discomfort, poor exercise tolerance and skin xanthelasma or statin intolerance including myalgias. LDL not at goal.  Lab Results  Component Value Date   CHOL 155 08/23/2017   Lab  Results  Component Value Date   HDL 29 (L) 08/23/2017   Lab Results  Component Value Date   LDLCALC 93 08/23/2017   Lab Results  Component Value Date   TRIG 163 (H) 08/23/2017   Lab Results  Component Value Date   CHOLHDL 5.3 (H) 08/23/2017   Review of Systems  Constitutional: Negative for fever, malaise/fatigue and weight loss.  HENT: Negative.  Negative for nosebleeds.   Eyes: Negative.  Negative for blurred vision, double vision and photophobia.  Respiratory: Negative.  Negative for cough and shortness of breath.   Cardiovascular: Negative.  Negative for chest pain, palpitations and leg swelling.  Gastrointestinal: Negative.  Negative for heartburn, nausea and vomiting.  Musculoskeletal: Positive for back pain and joint pain (chronic gout). Negative for myalgias.  Neurological: Negative.  Negative for dizziness, focal weakness, seizures and headaches.  Psychiatric/Behavioral: Negative.  Negative for suicidal ideas.    Past Medical History:  Diagnosis Date  . Acid reflux   . Arthritis   . Diabetes mellitus without complication ([place]   . Gout   . High cholesterol   . Hypertension   . Obesity   . Pneumonia     Past Surgical History:  Procedure Laterality Date  . CYSTECTOMY     testicle   . KNEE ARTHROSCOPY     left two knee surgery   . SHOULDER ARTHROSCOPY     one left shoulder   . TONSILLECTOMY      Family History  Problem Relation Age of Onset  . Cirrhosis Father   . Stroke Father   . Hypertension Brother     Social History Reviewed with no changes to be made today.   Outpatient Medications Prior to Visit  Medication Sig Dispense Refill  . Blood Glucose Monitoring Suppl (TRUE METRIX METER) w/Device KIT Use as instructed. Check blood glucose levels 2 times per day 1 kit 0  . Multiple Vitamin (MULTIVITAMIN WITH MINERALS) TABS tablet Take 1 tablet by mouth daily.    .Marland Kitchenatorvastatin (LIPITOR) 40 MG tablet Take 1 tablet (40 mg total) by mouth daily. 90  tablet 3  . diclofenac (VOLTAREN) 75 MG EC tablet Take 1 tablet (75 mg total) by mouth 2 (two) times daily. 60 tablet 3  . glucose blood (TRUE METRIX BLOOD GLUCOSE TEST) test strip Use as instructed. Check blood glucose levels 2 times per day 100 each 11  . lisinopril-hydrochlorothiazide (ZESTORETIC) 10-12.5 MG tablet Take 1 tablet by mouth daily. 90 tablet 3  . metFORMIN (GLUCOPHAGE) 500 MG tablet Take 1 tablet (500 mg total) by mouth 2 (two) times daily with a meal. 180 tablet 3  . TRUEPLUS LANCETS 28G MISC Use as instructed. Check blood glucose levels 2 times per day 100 each 3  . omeprazole (PRILOSEC) 20 MG capsule Take 1 capsule (20 mg total) by mouth daily. (Patient taking differently: Take 20 mg by mouth daily as needed (heartburn). ) 30 capsule 0  . allopurinol (ZYLOPRIM) 100 MG tablet Take 2 tablets (200 mg total) by mouth daily. 60 tablet 5   No facility-administered medications prior to visit.  No Known Allergies     Objective:    BP 125/84 (BP Location: Right Arm, Patient Position: Sitting, Cuff Size: Large)   Pulse 86   Ht '5\' 8"'  (1.727 m)   Wt 242 lb 9.6 oz (110 kg)   SpO2 97%   BMI 36.89 kg/m  Wt Readings from Last 3 Encounters:  12/09/17 242 lb 9.6 oz (110 kg)  09/09/17 259 lb (117.5 kg)  09/07/17 265 lb 3.2 oz (120.3 kg)    Physical Exam  Constitutional: He is oriented to person, place, and time. He appears well-developed and well-nourished. He is cooperative.  HENT:  Head: Normocephalic and atraumatic.  Eyes: EOM are normal.  Neck: Normal range of motion.  Cardiovascular: Normal rate, regular rhythm and normal heart sounds. Exam reveals no gallop and no friction rub.  No murmur heard. Pulmonary/Chest: Effort normal and breath sounds normal. No tachypnea. No respiratory distress. He has no decreased breath sounds. He has no wheezes. He has no rhonchi. He has no rales. He exhibits no tenderness.  Abdominal: Soft. Bowel sounds are normal.  Musculoskeletal:  Normal range of motion. He exhibits no edema.  Neurological: He is alert and oriented to person, place, and time. Coordination normal.  Skin: Skin is warm and dry.  Psychiatric: He has a normal mood and affect. His behavior is normal. Judgment and thought content normal.  Nursing note and vitals reviewed.      Patient has been counseled extensively about nutrition and exercise as well as the importance of adherence with medications and regular follow-up. The patient was given clear instructions to go to ER or return to medical center if symptoms don't improve, worsen or new problems develop. The patient verbalized understanding.   Follow-up: Return in about 3 months (around 03/11/2018) for Diabetes.   Gildardo Pounds, FNP-BC [facility] [place], [place]   12/09/2017, 2:03 PM

## 2017-12-13 ENCOUNTER — Other Ambulatory Visit: Payer: Self-pay

## 2017-12-13 ENCOUNTER — Telehealth: Payer: Self-pay | Admitting: Nurse Practitioner

## 2017-12-13 NOTE — Telephone Encounter (Signed)
Return call from Pt, he was inform so far still did not got any email for the documents he is missing to complete the CAFA application

## 2017-12-22 ENCOUNTER — Telehealth: Payer: Self-pay | Admitting: Nurse Practitioner

## 2017-12-22 NOTE — Telephone Encounter (Signed)
I try to called Pt, to inform that we got the email and not we are waiting for [facility] to review the application the documents was submitted on 12/14/17 it will take 2 weeks at least from that day to be review

## 2017-12-23 LAB — FECAL OCCULT BLOOD, IMMUNOCHEMICAL: Fecal Occult Bld: NEGATIVE

## 2017-12-30 ENCOUNTER — Telehealth (INDEPENDENT_AMBULATORY_CARE_PROVIDER_SITE_OTHER): Payer: Self-pay

## 2017-12-30 NOTE — Telephone Encounter (Signed)
Patient states he is out of test strips and pharmacy says he can not have filled because its too soon and he must be checking more that BID. Please advise. Maryjean Mornempestt S Roberts, CMA

## 2017-12-30 NOTE — Telephone Encounter (Signed)
-----   Message from Claiborne RiggZelda W Fleming, NP sent at 12/26/2017 10:27 PM EST ----- Your test is negative for blood in the stool.

## 2017-12-30 NOTE — Telephone Encounter (Signed)
Patient is aware that FIT negative for blood in stool. Christopher Hurst, CMA

## 2018-01-03 NOTE — Telephone Encounter (Signed)
Please inform patient that his insurance does not allow him to have more test strips at this time. His A1c also does not warrant testing more than 1-2 times per day.

## 2018-01-03 NOTE — Telephone Encounter (Signed)
Patient is aware. Patient will wait until next available date for more strips. Maryjean Mornempestt S Cathryne Mancebo, CMA

## 2018-01-05 ENCOUNTER — Telehealth (INDEPENDENT_AMBULATORY_CARE_PROVIDER_SITE_OTHER): Payer: Self-pay | Admitting: Orthopaedic Surgery

## 2018-01-05 NOTE — Telephone Encounter (Signed)
Patient called stating that he would like to reschedule his c-spine surgery that had been cancelled the end of August because of his A1c being to high (8.2).  Since August 30th, he says he's lost 25+ pounds and his A1C is now at 5.9. A clearance letter has been sent to Bertram DenverZelda Fleming, NP with [facility].  His preference is to have the surgery sometime in January.

## 2018-01-06 ENCOUNTER — Telehealth: Payer: Self-pay

## 2018-01-06 NOTE — Telephone Encounter (Signed)
Pt left VM on my direct line in regards to a repeat EKG that may need to be done again since he was just re-approved for surgery. I left a vm for the patient letting him know that things of that nature are typically done at a pre-op appt but if they had any concerns the surgeon can reach his PCP thru Epic.

## 2018-01-06 NOTE — Telephone Encounter (Signed)
fyi

## 2018-01-06 NOTE — Telephone Encounter (Signed)
Ucall. OK thanks tell him great job. He can come back in for pre-op visit with Fayrene FearingJames thanks

## 2018-01-09 NOTE — Telephone Encounter (Signed)
I called patient and advised.  Debbie--Please schedule with Fayrene FearingJames for history and physical after you receive clearance per Dr. Ophelia CharterYates.

## 2018-01-12 MED FILL — ?METFORMIN HCL 500MG TABLET: 500 | 30 days supply | Qty: 60 | Fill #0

## 2018-01-12 MED FILL — TRUEplus LANCETS 28G MISC: 50 days supply | Qty: 100 | Fill #0

## 2018-01-12 MED FILL — LISINOPRIL-HCTZ 10-12.5 MG: 10-12.5 | 30 days supply | Qty: 30 | Fill #1

## 2018-01-12 MED FILL — DICLOFENAC SOD EC 75 MG TAB: 75 | 30 days supply | Qty: 60 | Fill #1

## 2018-01-12 MED FILL — TRUE METRIX TEST STRIP: 25 days supply | Qty: 50 | Fill #0

## 2018-01-12 MED FILL — ?ATORVASTATIN 40MG TABLET: 40 | 30 days supply | Qty: 30 | Fill #0

## 2018-01-12 MED FILL — ?ALLOPURINOL 100MG TABLET: 100 | 30 days supply | Qty: 60 | Fill #1

## 2018-01-20 ENCOUNTER — Telehealth: Payer: Self-pay | Admitting: Nurse Practitioner

## 2018-01-20 NOTE — Telephone Encounter (Signed)
Patient called to get an update on their medical clearance for surgery from [facility]. Patient is scheduled for the surgery on 01/17 for the surgery. Patient was told that the clearance is needed by Monday in order to continue as scheduled.   Please follow up.

## 2018-01-23 NOTE — Telephone Encounter (Signed)
Labwork appointment has been scheduled

## 2018-01-23 NOTE — Telephone Encounter (Signed)
Paperwork has been recived and patient will be called to schedule an appointment for lab work.

## 2018-01-24 ENCOUNTER — Encounter (INDEPENDENT_AMBULATORY_CARE_PROVIDER_SITE_OTHER): Payer: Self-pay | Admitting: Surgery

## 2018-01-24 ENCOUNTER — Other Ambulatory Visit: Payer: Self-pay

## 2018-01-24 ENCOUNTER — Ambulatory Visit: Payer: Self-pay | Attending: Family Medicine

## 2018-01-24 ENCOUNTER — Ambulatory Visit (INDEPENDENT_AMBULATORY_CARE_PROVIDER_SITE_OTHER): Payer: Self-pay | Admitting: Surgery

## 2018-01-24 VITALS — BP 142/80 | HR 67 | Resp 12 | Ht 68.0 in | Wt 240.0 lb

## 2018-01-24 DIAGNOSIS — Z01812 Encounter for preprocedural laboratory examination: Secondary | ICD-10-CM | POA: Insufficient documentation

## 2018-01-24 DIAGNOSIS — Z01818 Encounter for other preprocedural examination: Secondary | ICD-10-CM

## 2018-01-24 DIAGNOSIS — M4802 Spinal stenosis, cervical region: Secondary | ICD-10-CM

## 2018-01-24 NOTE — Progress Notes (Signed)
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  Symptoms unchanged from previous visit.  He is want to proceed with surgery as scheduled.  We are awaiting preop medical clearance and expect to have this tomorrow.  Full history physical performed today.  All questions answered.

## 2018-01-24 NOTE — Progress Notes (Signed)
Patient here for lab visit only 

## 2018-01-25 ENCOUNTER — Telehealth (INDEPENDENT_AMBULATORY_CARE_PROVIDER_SITE_OTHER): Payer: Self-pay | Admitting: Orthopaedic Surgery

## 2018-01-25 LAB — CBC WITH DIFFERENTIAL/PLATELET
Basophils Absolute: 0.1 10*3/uL (ref 0.0–0.2)
Basos: 1 %
EOS (ABSOLUTE): 0.2 10*3/uL (ref 0.0–0.4)
Eos: 3 %
Hematocrit: 38.2 % (ref 37.5–51.0)
Hemoglobin: 13 g/dL (ref 13.0–17.7)
Immature Grans (Abs): 0 10*3/uL (ref 0.0–0.1)
Immature Granulocytes: 0 %
LYMPHS: 35 %
Lymphocytes Absolute: 2.1 10*3/uL (ref 0.7–3.1)
MCH: 29.1 pg (ref 26.6–33.0)
MCHC: 34 g/dL (ref 31.5–35.7)
MCV: 86 fL (ref 79–97)
Monocytes Absolute: 0.6 10*3/uL (ref 0.1–0.9)
Monocytes: 10 %
NEUTROS ABS: 3 10*3/uL (ref 1.4–7.0)
Neutrophils: 51 %
Platelets: 275 10*3/uL (ref 150–450)
RBC: 4.47 x10E6/uL (ref 4.14–5.80)
RDW: 13.6 % (ref 11.6–15.4)
WBC: 5.9 10*3/uL (ref 3.4–10.8)

## 2018-01-25 LAB — CMP14+EGFR
A/G RATIO: 2 (ref 1.2–2.2)
ALT: 16 IU/L (ref 0–44)
AST: 13 IU/L (ref 0–40)
Albumin: 4.4 g/dL (ref 3.5–5.5)
Alkaline Phosphatase: 106 IU/L (ref 39–117)
BUN/Creatinine Ratio: 12 (ref 9–20)
BUN: 10 mg/dL (ref 6–24)
Bilirubin Total: 0.2 mg/dL (ref 0.0–1.2)
CO2: 28 mmol/L (ref 20–29)
Calcium: 9.8 mg/dL (ref 8.7–10.2)
Chloride: 100 mmol/L (ref 96–106)
Creatinine, Ser: 0.82 mg/dL (ref 0.76–1.27)
GFR calc Af Amer: 117 mL/min/{1.73_m2} (ref >59)
GFR calc non Af Amer: 101 mL/min/{1.73_m2} (ref >59)
Globulin, Total: 2.2 g/dL (ref 1.5–4.5)
Glucose: 88 mg/dL (ref 65–99)
POTASSIUM: 4.2 mmol/L (ref 3.5–5.2)
Sodium: 144 mmol/L (ref 134–144)
Total Protein: 6.6 g/dL (ref 6.0–8.5)

## 2018-01-25 NOTE — Telephone Encounter (Signed)
Patient called needing to know the results of his blood work. The number to contact patient is [phone]

## 2018-01-25 NOTE — Telephone Encounter (Signed)
Please advise 

## 2018-01-26 ENCOUNTER — Telehealth: Payer: Self-pay | Admitting: Nurse Practitioner

## 2018-01-26 NOTE — Telephone Encounter (Signed)
I called. Labs all OK

## 2018-01-26 NOTE — Telephone Encounter (Signed)
Patient will be called once paperwork is ready for pick up. 

## 2018-01-26 NOTE — Telephone Encounter (Signed)
Pt calling for lab results and sign off for orthopedic surgery date 02/03/18. Please call pt [phone].

## 2018-01-27 NOTE — Pre-Procedure Instructions (Signed)
Christopher Hurst.  01/27/2018      [facility] [place] [place] [address] Phone: [phone] Fax: [phone]  [id number] - [place], [place] - [address] [place] [id number] Phone: [phone] Fax: [phone]    Your procedure is scheduled on Friday January 17th.  [facility] at 0800 A.M.  Call this number if you have problems the morning of surgery:  [phone]   Remember:  Do not eat or drink after midnight.    Take these medicines the morning of surgery with A SIP OF WATER  acetaminophen (TYLENOL) if needed allopurinol (ZYLOPRIM)  atorvastatin (LIPITOR)  omeprazole (PRILOSEC)   7 days prior to surgery STOP taking any diclofenac (VOLTAREN), Aspirin (unless otherwise instructed by your surgeon), Aleve, Naproxen, Ibuprofen, Motrin, Advil, Goody's, BC's, all herbal medications, fish oil, and all vitamins.   WHAT DO I DO ABOUT MY DIABETES MEDICATION?   Marland Kitchen. Do not take oral diabetes medicines (pills) the morning of surgery: metFORMIN (GLUCOPHAGE).  . The day of surgery, do not take other diabetes injectables, including Byetta (exenatide), Bydureon (exenatide ER), Victoza (liraglutide), or Trulicity (dulaglutide).  . If your CBG is greater than 220 mg/dL, you may take  of your sliding scale (correction) dose of insulin.   How to Manage Your Diabetes Before and After Surgery  Why is it important to control my blood sugar before and after surgery? . Improving blood sugar levels before and after surgery helps healing and can limit problems. . A way of improving blood sugar control is eating a healthy diet by: o  Eating less sugar and carbohydrates o  Increasing activity/exercise o  Talking with your doctor about reaching your blood sugar goals . High blood sugars (greater than 180 mg/dL) can raise your risk of infections and slow your  recovery, so you will need to focus on controlling your diabetes during the weeks before surgery. . Make sure that the doctor who takes care of your diabetes knows about your planned surgery including the date and location.  How do I manage my blood sugar before surgery? . Check your blood sugar at least 4 times a day, starting 2 days before surgery, to make sure that the level is not too high or low. o Check your blood sugar the morning of your surgery when you wake up and every 2 hours until you get to the Short Stay unit. . If your blood sugar is less than 70 mg/dL, you will need to treat for low blood sugar: o Do not take insulin. o Treat a low blood sugar (less than 70 mg/dL) with  cup of clear juice (cranberry or apple), 4 glucose tablets, OR glucose gel. o Recheck blood sugar in 15 minutes after treatment (to make sure it is greater than 70 mg/dL). If your blood sugar is not greater than 70 mg/dL on recheck, call [phone] for further instructions. . Report your blood sugar to the short stay nurse when you get to Short Stay.  . If you are admitted to the hospital after surgery: o Your blood sugar will be checked by the staff and you will probably be given insulin after surgery (instead of oral diabetes medicines) to make sure you have good blood sugar levels. o The goal for blood sugar control after surgery is 80-180 mg/dL.     Do not wear jewelry.  Do  not wear lotions, powders, or colognes, or deodorant.  Men may shave face and neck.  Do not bring valuables to the hospital.  [facility] is not responsible for any belongings or valuables.  Contacts, dentures or bridgework may not be worn into surgery.  Leave your suitcase in the car.  After surgery it may be brought to your room.  For patients admitted to the hospital, discharge time will be determined by your treatment team.  Patients discharged the day of surgery will not be allowed to drive home.    [place]- Preparing  For Surgery  Before surgery, you can play an important role. Because skin is not sterile, your skin needs to be as free of germs as possible. You can reduce the number of germs on your skin by washing with CHG (chlorahexidine gluconate) Soap before surgery.  CHG is an antiseptic cleaner which kills germs and bonds with the skin to continue killing germs even after washing.    Oral Hygiene is also important to reduce your risk of infection.  Remember - BRUSH YOUR TEETH THE MORNING OF SURGERY WITH YOUR REGULAR TOOTHPASTE  Please do not use if you have an allergy to CHG or antibacterial soaps. If your skin becomes reddened/irritated stop using the CHG.  Do not shave (including legs and underarms) for at least 48 hours prior to first CHG shower. It is OK to shave your face.  Please follow these instructions carefully.   1. Shower the NIGHT BEFORE SURGERY and the MORNING OF SURGERY with CHG.   2. If you chose to wash your hair, wash your hair first as usual with your normal shampoo.  3. After you shampoo, rinse your hair and body thoroughly to remove the shampoo.  4. Use CHG as you would any other liquid soap. You can apply CHG directly to the skin and wash gently with a scrungie or a clean washcloth.   5. Apply the CHG Soap to your body ONLY FROM THE NECK DOWN.  Do not use on open wounds or open sores. Avoid contact with your eyes, ears, mouth and genitals (private parts). Wash Face and genitals (private parts)  with your normal soap.  6. Wash thoroughly, paying special attention to the area where your surgery will be performed.  7. Thoroughly rinse your body with warm water from the neck down.  8. DO NOT shower/wash with your normal soap after using and rinsing off the CHG Soap.  9. Pat yourself dry with a CLEAN TOWEL.  10. Wear CLEAN PAJAMAS to bed the night before surgery, wear comfortable clothes the morning of surgery  11. Place CLEAN SHEETS on your bed the night of your first shower  and DO NOT SLEEP WITH PETS.    Day of Surgery:  Do not apply any deodorants/lotions.  Please wear clean clothes to the hospital/surgery center.   Remember to brush your teeth WITH YOUR REGULAR TOOTHPASTE.    Please read over the following fact sheets that you were given.

## 2018-01-30 ENCOUNTER — Telehealth (INDEPENDENT_AMBULATORY_CARE_PROVIDER_SITE_OTHER): Payer: Self-pay | Admitting: Orthopaedic Surgery

## 2018-01-30 ENCOUNTER — Inpatient Hospital Stay (HOSPITAL_COMMUNITY)
Admission: RE | Admit: 2018-01-30 | Discharge: 2018-01-30 | Disposition: A | Discharge status: Home / Self Care | Payer: Medicaid Other | Source: Ambulatory Visit

## 2018-01-30 NOTE — Telephone Encounter (Signed)
fyi

## 2018-01-30 NOTE — Telephone Encounter (Signed)
Patient cancelled surgery for this Friday 02-03-18  He has to be out of his apartment and the only day he is able to move is Friday 02-03-18. He has rescheduled his surgery to 03-03-18.

## 2018-01-31 ENCOUNTER — Telehealth: Payer: Self-pay | Admitting: Nurse Practitioner

## 2018-01-31 NOTE — Telephone Encounter (Signed)
Patient came in to drop off handicap placard application. Will be dropped off in PCP box  Patient would like paperwork mailed back to him to his PO box

## 2018-01-31 NOTE — Telephone Encounter (Signed)
Pt came in to request an update on his Prior Auth, he does not want a copy to pick up just a notification that it has been faxed.  * He came in and dropped off a handicap place card, please mail back to his address when completed*

## 2018-01-31 NOTE — Telephone Encounter (Signed)
Will route to PCP 

## 2018-02-01 ENCOUNTER — Ambulatory Visit (INDEPENDENT_AMBULATORY_CARE_PROVIDER_SITE_OTHER): Payer: Self-pay | Admitting: Surgery

## 2018-02-01 NOTE — Telephone Encounter (Signed)
I will forward this message to his PCP to see if will approve. Not mentioned in OV notes, may see if she is able to approved based on problem list.  Thank you.

## 2018-02-01 NOTE — Telephone Encounter (Signed)
Will have signed by Friday to be sent to his home address

## 2018-02-02 ENCOUNTER — Telehealth (INDEPENDENT_AMBULATORY_CARE_PROVIDER_SITE_OTHER): Payer: Self-pay

## 2018-02-02 NOTE — Telephone Encounter (Signed)
Left message asking patient to return call to [phone]. Christopher Hurst, CMA  

## 2018-02-02 NOTE — Telephone Encounter (Signed)
-----   Message from Claiborne Rigg, NP sent at 02/01/2018 10:49 PM EST ----- All of your labs are normal and your handicap form will be mailed on Friday to your PO box

## 2018-02-02 NOTE — Telephone Encounter (Signed)
-----   Message from Zelda W Fleming, NP sent at 02/01/2018 10:49 PM EST ----- All of your labs are normal and your handicap form will be mailed on Friday to your PO box 

## 2018-02-02 NOTE — Telephone Encounter (Signed)
Patient is aware that labs are normal and handicap form will be mailed to PO Box. [name], CMA

## 2018-02-10 ENCOUNTER — Inpatient Hospital Stay (INDEPENDENT_AMBULATORY_CARE_PROVIDER_SITE_OTHER): Payer: Self-pay | Admitting: Orthopaedic Surgery

## 2018-02-10 MED FILL — TRUE METRIX TEST STRIP: 25 days supply | Qty: 50 | Fill #1

## 2018-02-10 MED FILL — LISINOPRIL-HCTZ 10-12.5 MG: 10-12.5 | 30 days supply | Qty: 30 | Fill #2

## 2018-02-10 MED FILL — ?ATORVASTATIN 40MG TABLET: 40 | 30 days supply | Qty: 30 | Fill #1

## 2018-02-10 MED FILL — DICLOFENAC SOD EC 75 MG TAB: 75 | 30 days supply | Qty: 60 | Fill #2

## 2018-02-10 MED FILL — metFORMIN HCL 500 MG TABS: 500 | 30 days supply | Qty: 60 | Fill #2

## 2018-02-10 MED FILL — ?ALLOPURINOL 100MG TABLET: 100 | 30 days supply | Qty: 60 | Fill #2

## 2018-02-14 ENCOUNTER — Inpatient Hospital Stay (INDEPENDENT_AMBULATORY_CARE_PROVIDER_SITE_OTHER): Payer: Self-pay | Admitting: Orthopaedic Surgery

## 2018-02-24 NOTE — Pre-Procedure Instructions (Signed)
Christopher Hurst.  02/24/2018      [facility] - [place], [place] - [address] [place] [address] Phone: [phone] Fax: [phone]  [id number] - [place], [place] - [address] [place] [address] Phone: [phone] Fax: [phone]    Your procedure is scheduled on February 28  Report to [facility] Admitting at 0530 A.M.  Call this number if you have problems the morning of surgery:  [phone]   Remember:  Do not eat or drink after midnight.    Take these medicines the morning of surgery with A SIP OF WATER  acetaminophen (TYLENOL allopurinol (ZYLOPRIM) omeprazole (PRILOSEC)   7 days prior to surgery STOP taking any diclofenac (VOLTAREN), Aspirin (unless otherwise instructed by your surgeon), Aleve, Naproxen, Ibuprofen, Motrin, Advil, Goody's, BC's, all herbal medications, fish oil, and all vitamins.   WHAT DO I DO ABOUT MY DIABETES MEDICATION?   Marland Kitchen Do not take oral diabetes medicines (pills) the morning of surgery. metFORMIN (GLUCOPHAGE)    How to Manage Your Diabetes Before and After Surgery  Why is it important to control my blood sugar before and after surgery? . Improving blood sugar levels before and after surgery helps healing and can limit problems. . A way of improving blood sugar control is eating a healthy diet by: o  Eating less sugar and carbohydrates o  Increasing activity/exercise o  Talking with your doctor about reaching your blood sugar goals . High blood sugars (greater than 180 mg/dL) can raise your risk of infections and slow your recovery, so you will need to focus on controlling your diabetes during the weeks before surgery. . Make sure that the doctor who takes care of your diabetes knows about your planned surgery including the date and location.  How do I manage my blood sugar before surgery? . Check your blood sugar at least 4 times a day,  starting 2 days before surgery, to make sure that the level is not too high or low. o Check your blood sugar the morning of your surgery when you wake up and every 2 hours until you get to the Short Stay unit. . If your blood sugar is less than 70 mg/dL, you will need to treat for low blood sugar: o Do not take insulin. o Treat a low blood sugar (less than 70 mg/dL) with  cup of clear juice (cranberry or apple), 4 glucose tablets, OR glucose gel. o Recheck blood sugar in 15 minutes after treatment (to make sure it is greater than 70 mg/dL). If your blood sugar is not greater than 70 mg/dL on recheck, call [phone] for further instructions. . Report your blood sugar to the short stay nurse when you get to Short Stay.  . If you are admitted to the hospital after surgery: o Your blood sugar will be checked by the staff and you will probably be given insulin after surgery (instead of oral diabetes medicines) to make sure you have good blood sugar levels. o The goal for blood sugar control after surgery is 80-180 mg/dL.    Do not wear jewelry  Do not wear lotions, powders, or cologne, or deodorant.  Men may shave face and neck.  Do not bring valuables to the hospital.  [facility] is not responsible for any belongings or valuables.  Contacts, dentures or bridgework may not be worn into surgery.  Leave your suitcase in the car.  After surgery it may be brought to your room.  For patients admitted to the hospital, discharge time will be determined by your treatment team.  Patients discharged the day of surgery will not be allowed to drive home.    Special instructions:   [facility]- Preparing For Surgery  Before surgery, you can play an important role. Because skin is not sterile, your skin needs to be as free of germs as possible. You can reduce the number of germs on your skin by washing with CHG (chlorahexidine gluconate) Soap before surgery.  CHG is an antiseptic cleaner which kills  germs and bonds with the skin to continue killing germs even after washing.    Oral Hygiene is also important to reduce your risk of infection.  Remember - BRUSH YOUR TEETH THE MORNING OF SURGERY WITH YOUR REGULAR TOOTHPASTE  Please do not use if you have an allergy to CHG or antibacterial soaps. If your skin becomes reddened/irritated stop using the CHG.  Do not shave (including legs and underarms) for at least 48 hours prior to first CHG shower. It is OK to shave your face.  Please follow these instructions carefully.   1. Shower the NIGHT BEFORE SURGERY and the MORNING OF SURGERY with CHG.   2. If you chose to wash your hair, wash your hair first as usual with your normal shampoo.  3. After you shampoo, rinse your hair and body thoroughly to remove the shampoo.  4. Use CHG as you would any other liquid soap. You can apply CHG directly to the skin and wash gently with a scrungie or a clean washcloth.   5. Apply the CHG Soap to your body ONLY FROM THE NECK DOWN.  Do not use on open wounds or open sores. Avoid contact with your eyes, ears, mouth and genitals (private parts). Wash Face and genitals (private parts)  with your normal soap.  6. Wash thoroughly, paying special attention to the area where your surgery will be performed.  7. Thoroughly rinse your body with warm water from the neck down.  8. DO NOT shower/wash with your normal soap after using and rinsing off the CHG Soap.  9. Pat yourself dry with a CLEAN TOWEL.  10. Wear CLEAN PAJAMAS to bed the night before surgery, wear comfortable clothes the morning of surgery  11. Place CLEAN SHEETS on your bed the night of your first shower and DO NOT SLEEP WITH PETS.    Day of Surgery:  Do not apply any deodorants/lotions.  Please wear clean clothes to the hospital/surgery center.   Remember to brush your teeth WITH YOUR REGULAR TOOTHPASTE.    Please read over the following fact sheets that you were given.

## 2018-02-27 ENCOUNTER — Inpatient Hospital Stay (HOSPITAL_COMMUNITY)
Admission: RE | Admit: 2018-02-27 | Discharge: 2018-02-27 | Disposition: A | Discharge status: Home / Self Care | Payer: Medicaid Other | Source: Ambulatory Visit

## 2018-03-10 ENCOUNTER — Encounter (HOSPITAL_COMMUNITY)
Admission: RE | Admit: 2018-03-10 | Discharge: 2018-03-10 | Disposition: A | Discharge status: Home / Self Care | Payer: Medicaid Other | Source: Ambulatory Visit | Attending: Surgery | Admitting: Surgery

## 2018-03-10 ENCOUNTER — Encounter (HOSPITAL_COMMUNITY)
Admission: RE | Admit: 2018-03-10 | Discharge: 2018-03-10 | Disposition: A | Discharge status: Home / Self Care | Payer: Medicaid Other | Source: Ambulatory Visit | Attending: Orthopaedic Surgery | Admitting: Orthopaedic Surgery

## 2018-03-10 ENCOUNTER — Encounter (HOSPITAL_COMMUNITY): Payer: Self-pay

## 2018-03-10 ENCOUNTER — Other Ambulatory Visit: Payer: Self-pay

## 2018-03-10 DIAGNOSIS — Z01818 Encounter for other preprocedural examination: Secondary | ICD-10-CM | POA: Insufficient documentation

## 2018-03-10 LAB — URINALYSIS, ROUTINE W REFLEX MICROSCOPIC
Bilirubin Urine: NEGATIVE
Glucose, UA: NEGATIVE mg/dL
Hgb urine dipstick: NEGATIVE
Ketones, ur: NEGATIVE mg/dL
Leukocytes,Ua: NEGATIVE
Nitrite: NEGATIVE
Protein, ur: NEGATIVE mg/dL
Specific Gravity, Urine: 1.009 (ref 1.005–1.030)
pH: 8 (ref 5.0–8.0)

## 2018-03-10 LAB — COMPREHENSIVE METABOLIC PANEL
ALT: 16 U/L (ref 0–44)
AST: 18 U/L (ref 15–41)
Albumin: 4 g/dL (ref 3.5–5.0)
Alkaline Phosphatase: 84 U/L (ref 38–126)
Anion gap: 12 (ref 5–15)
BUN: 10 mg/dL (ref 6–20)
CO2: 24 mmol/L (ref 22–32)
Calcium: 9.4 mg/dL (ref 8.9–10.3)
Chloride: 104 mmol/L (ref 98–111)
Creatinine, Ser: 0.78 mg/dL (ref 0.61–1.24)
GFR calc Af Amer: >60 mL/min (ref >60)
GFR calc non Af Amer: >60 mL/min (ref >60)
Glucose, Bld: 137 mg/dL — ABNORMAL HIGH (ref 70–99)
Potassium: 3.8 mmol/L (ref 3.5–5.1)
Sodium: 140 mmol/L (ref 135–145)
Total Bilirubin: 0.5 mg/dL (ref 0.3–1.2)
Total Protein: 6.8 g/dL (ref 6.5–8.1)

## 2018-03-10 LAB — CBC
HCT: 41.5 % (ref 39.0–52.0)
Hemoglobin: 12.9 g/dL — ABNORMAL LOW (ref 13.0–17.0)
MCH: 27.7 pg (ref 26.0–34.0)
MCHC: 31.1 g/dL (ref 30.0–36.0)
MCV: 89.2 fL (ref 80.0–100.0)
Platelets: 226 10*3/uL (ref 150–400)
RBC: 4.65 MIL/uL (ref 4.22–5.81)
RDW: 13.9 % (ref 11.5–15.5)
WBC: 4.6 10*3/uL (ref 4.0–10.5)
nRBC: 0 % (ref 0.0–0.2)

## 2018-03-10 LAB — SURGICAL PCR SCREEN
MRSA, PCR: NEGATIVE
Staphylococcus aureus: NEGATIVE

## 2018-03-10 LAB — HEMOGLOBIN A1C
Hgb A1c MFr Bld: 5.7 % — ABNORMAL HIGH (ref 4.8–5.6)
Mean Plasma Glucose: 116.89 mg/dL

## 2018-03-10 LAB — GLUCOSE, CAPILLARY: GLUCOSE-CAPILLARY: 129 mg/dL — AB (ref 70–99)

## 2018-03-10 NOTE — Progress Notes (Signed)
PCP - Dr. Bertram [place] Cardiologist - denies  Chest x-ray - 03/10/18 EKG - 08/23/17 Stress Test - denies ECHO - denies Cardiac Cath - denies  Sleep Study - denies; PCP aware and has plans to get a study done after this surgery.   Fasting Blood Sugar - 75-90 Last A1C 5.9 3 months ago. Down from 8.2, 6 months ago. Pt's surgery previously cancelled d/t A1C being high.  Checks Blood Sugar 2 times a day.  CBG at PAT 129.   Blood Thinner Instructions:N/A Aspirin Instructions:N/A  Anesthesia review: Yes, EKG review.  Patient denies shortness of breath, fever, cough and chest pain at PAT appointment   Patient verbalized understanding of instructions that were given to them at the PAT appointment. Patient was also instructed that they will need to review over the PAT instructions again at home before surgery.

## 2018-03-13 ENCOUNTER — Encounter: Payer: Self-pay | Admitting: Nurse Practitioner

## 2018-03-13 ENCOUNTER — Ambulatory Visit: Payer: Medicaid Other | Attending: Nurse Practitioner | Admitting: Nurse Practitioner

## 2018-03-13 VITALS — BP 135/87 | HR 76 | Temp 98.2°F | Ht 68.0 in | Wt 233.8 lb

## 2018-03-13 DIAGNOSIS — I10 Essential (primary) hypertension: Secondary | ICD-10-CM | POA: Insufficient documentation

## 2018-03-13 DIAGNOSIS — E669 Obesity, unspecified: Secondary | ICD-10-CM | POA: Insufficient documentation

## 2018-03-13 DIAGNOSIS — M1A09X0 Idiopathic chronic gout, multiple sites, without tophus (tophi): Secondary | ICD-10-CM | POA: Diagnosis not present

## 2018-03-13 DIAGNOSIS — Z7984 Long term (current) use of oral hypoglycemic drugs: Secondary | ICD-10-CM | POA: Diagnosis not present

## 2018-03-13 DIAGNOSIS — Z8249 Family history of ischemic heart disease and other diseases of the circulatory system: Secondary | ICD-10-CM | POA: Diagnosis not present

## 2018-03-13 DIAGNOSIS — Z791 Long term (current) use of non-steroidal anti-inflammatories (NSAID): Secondary | ICD-10-CM | POA: Insufficient documentation

## 2018-03-13 DIAGNOSIS — Z79899 Other long term (current) drug therapy: Secondary | ICD-10-CM | POA: Diagnosis not present

## 2018-03-13 DIAGNOSIS — M1990 Unspecified osteoarthritis, unspecified site: Secondary | ICD-10-CM | POA: Insufficient documentation

## 2018-03-13 DIAGNOSIS — Z6835 Body mass index (BMI) 35.0-35.9, adult: Secondary | ICD-10-CM | POA: Insufficient documentation

## 2018-03-13 DIAGNOSIS — K219 Gastro-esophageal reflux disease without esophagitis: Secondary | ICD-10-CM | POA: Diagnosis not present

## 2018-03-13 DIAGNOSIS — E118 Type 2 diabetes mellitus with unspecified complications: Secondary | ICD-10-CM | POA: Diagnosis present

## 2018-03-13 DIAGNOSIS — E119 Type 2 diabetes mellitus without complications: Secondary | ICD-10-CM

## 2018-03-13 DIAGNOSIS — E782 Mixed hyperlipidemia: Secondary | ICD-10-CM | POA: Insufficient documentation

## 2018-03-13 LAB — GLUCOSE, POCT (MANUAL RESULT ENTRY): POC GLUCOSE: 101 mg/dL — AB (ref 70–99)

## 2018-03-13 MED ORDER — GLUCOSE BLOOD VI STRP: ORAL_STRIP | 11 refills | Status: DC

## 2018-03-13 MED ORDER — ATORVASTATIN CALCIUM 40 MG PO TABS: 40.0000 mg | ORAL_TABLET | Freq: Every day | ORAL | 3 refills | Status: DC

## 2018-03-13 MED ORDER — TRUEPLUS LANCETS 28G MISC: 3 refills | Status: DC

## 2018-03-13 MED ORDER — OMEPRAZOLE 20 MG PO CPDR: 20.0000 mg | DELAYED_RELEASE_CAPSULE | Freq: Every day | ORAL | 2 refills | Status: DC

## 2018-03-13 MED ORDER — ALLOPURINOL 100 MG PO TABS: 200.0000 mg | ORAL_TABLET | Freq: Every day | ORAL | 2 refills | Status: DC

## 2018-03-13 MED ORDER — METFORMIN HCL 500 MG PO TABS: 500.0000 mg | ORAL_TABLET | Freq: Two times a day (BID) | ORAL | 3 refills | Status: DC

## 2018-03-13 MED FILL — TRUE METRIX TEST STRIP: 25 days supply | Qty: 50 | Fill #2

## 2018-03-13 MED FILL — LISINOPRIL-HCTZ 10-12.5 MG: 10-12.5 | 30 days supply | Qty: 30 | Fill #3

## 2018-03-13 MED FILL — ?ALLOPURINOL 100MG TABLET: 100 | 30 days supply | Qty: 60 | Fill #3

## 2018-03-13 MED FILL — ?METFORMIN HCL 500MG TABL: 500 | 30 days supply | Qty: 60 | Fill #3

## 2018-03-13 MED FILL — ?ATORVASTATIN 40MG TABLET: 40 | 30 days supply | Qty: 30 | Fill #2

## 2018-03-13 MED FILL — DICLOFENAC SOD EC 75 MG TAB: 75 | 30 days supply | Qty: 60 | Fill #3

## 2018-03-13 MED FILL — TRUEplus LANCETS 28G MISC: 50 days supply | Qty: 100 | Fill #1

## 2018-03-13 NOTE — Progress Notes (Signed)
Assessment & Plan:  Christopher Hurst was seen today for follow-up.  Diagnoses and all orders for this visit:  Controlled type 2 diabetes mellitus with complication, without long-term current use of insulin (HCC) -     Glucose (CBG) -     metFORMIN (GLUCOPHAGE) 500 MG tablet; Take 1 tablet (500 mg total) by mouth 2 (two) times daily with a meal. -     TRUEPLUS LANCETS 28G MISC; Use as instructed. Check blood glucose levels 2 times per day -     glucose blood (TRUE METRIX BLOOD GLUCOSE TEST) test strip; Use as instructed. Check blood glucose levels 2 times per day -     Ambulatory referral to Ophthalmology  Continue blood sugar control as discussed in office today, low carbohydrate diet, and regular physical exercise as tolerated, 150 minutes per week (30 min each day, 5 days per week, or 50 min 3 days per week). Keep blood sugar logs with fasting goal of 90-130 mg/dl, post prandial (after you eat) less than 180.  For Hypoglycemia: BS <60 and Hyperglycemia BS >400; contact the clinic ASAP. Annual eye exams and foot exams are recommended.  Essential hypertension Continue all antihypertensives as prescribed.  Remember to bring in your blood pressure log with you for your follow up appointment.  DASH/Mediterranean Diets are healthier choices for HTN.    Chronic gout of multiple sites, unspecified cause -     Uric Acid -     allopurinol (ZYLOPRIM) 100 MG tablet; Take 2 tablets (200 mg total) by mouth daily.  Mixed hyperlipidemia -     atorvastatin (LIPITOR) 40 MG tablet; Take 1 tablet (40 mg total) by mouth daily. INSTRUCTIONS: Work on a low fat, heart healthy diet and participate in regular aerobic exercise program by working out at least 150 minutes per week; 5 days a week-30 minutes per day. Avoid red meat, fried foods. junk foods, sodas, sugary drinks, unhealthy snacking, alcohol and smoking.  Drink at least 48oz of water per day and monitor your carbohydrate intake daily.   Gastroesophageal  reflux disease, esophagitis presence not specified -     omeprazole (PRILOSEC) 20 MG capsule; Take 1 capsule (20 mg total) by mouth daily. INSTRUCTIONS: Avoid GERD Triggers: acidic, spicy or fried foods, caffeine, coffee, sodas,  alcohol and chocolate.   Patient has been counseled on age-appropriate routine health concerns for screening and prevention. These are reviewed and up-to-date. Referrals have been placed accordingly. Immunizations are up-to-date or declined.    Subjective:   Chief Complaint  Patient presents with  . Follow-up    Pt. is here for 3 months follow-up.    HPI Christopher Hurst. 53 y.o. male presents to office today for follow up to his chronic medical conditions.   DM TYPE 2 Chronic and well controlled. Denies any hypo or hyperglycemic symptoms. Current medications include: Metformin 500 mg BID. Monitoring blood glucose levels BID: average readings 160s.  He has intentionally lost 7lbs since January. He is overdue for eye exam. Patient has been advised to apply for financial assistance and schedule to see our financial counselor.  Lab Results  Component Value Date   HGBA1C 5.7 (H) 03/10/2018    ESSENTIAL HYPERTENSION Well controlled. Current medications include zestoretic 10-12.49m daily. He denies chest pain, shortness of breath, palpitations, lightheadedness, dizziness, headaches or BLE edema.  BP Readings from Last 3 Encounters:  03/13/18 135/87  03/10/18 129/76  01/24/18 (!) 142/80   Hyperlipidemia Patient presents for follow up to hyperlipidemia.  He  is medication compliant taking atorvastatin 40 mg daily. He is diet compliant, exercises and denies chest pain or statin intolerance including myalgias. LDL not at goal. Awaiting fasting lipid panel.  Lab Results  Component Value Date   CHOL 155 08/23/2017   Lab Results  Component Value Date   HDL 29 (L) 08/23/2017   Lab Results  Component Value Date   LDLCALC 93 08/23/2017   Lab Results  Component  Value Date   TRIG 163 (H) 08/23/2017   Lab Results  Component Value Date   CHOLHDL 5.3 (H) 08/23/2017     Review of Systems  Constitutional: Negative for fever, malaise/fatigue and weight loss.  HENT: Negative.  Negative for nosebleeds.   Eyes: Negative.  Negative for blurred vision, double vision and photophobia.  Respiratory: Negative.  Negative for cough and shortness of breath.   Cardiovascular: Negative.  Negative for chest pain, palpitations and leg swelling.  Gastrointestinal: Positive for heartburn. Negative for nausea and vomiting.  Musculoskeletal: Positive for joint pain. Negative for myalgias.       Hx of gout  Neurological: Negative.  Negative for dizziness, focal weakness, seizures and headaches.  Psychiatric/Behavioral: Negative.  Negative for suicidal ideas.    Past Medical History:  Diagnosis Date  . Acid reflux   . Arthritis   . Diabetes mellitus without complication ([place])   . Gout   . High cholesterol   . Hypertension   . Obesity   . Pneumonia     Past Surgical History:  Procedure Laterality Date  . CYSTECTOMY     testicle   . KNEE ARTHROSCOPY     left two knee surgery   . SHOULDER ARTHROSCOPY     one left shoulder   . TONSILLECTOMY      Family History  Problem Relation Age of Onset  . Cirrhosis Father   . Stroke Father   . Hypertension Brother     Social History Reviewed with no changes to be made today.   Outpatient Medications Prior to Visit  Medication Sig Dispense Refill  . acetaminophen (TYLENOL) 500 MG tablet Take 1,000 mg by mouth every 8 (eight) hours as needed (pain).    . Blood Glucose Monitoring Suppl (TRUE METRIX METER) w/Device KIT Use as instructed. Check blood glucose levels 2 times per day 1 kit 0  . lisinopril-hydrochlorothiazide (ZESTORETIC) 10-12.5 MG tablet Take 1 tablet by mouth daily. 90 tablet 3  . allopurinol (ZYLOPRIM) 100 MG tablet Take 200 mg by mouth daily.    Marland Kitchen atorvastatin (LIPITOR) 40 MG tablet Take 1  tablet (40 mg total) by mouth daily. 90 tablet 3  . diclofenac (VOLTAREN) 75 MG EC tablet Take 1 tablet (75 mg total) by mouth 2 (two) times daily. 60 tablet 3  . glucose blood (TRUE METRIX BLOOD GLUCOSE TEST) test strip Use as instructed. Check blood glucose levels 2 times per day 100 each 11  . metFORMIN (GLUCOPHAGE) 500 MG tablet Take 1 tablet (500 mg total) by mouth 2 (two) times daily with a meal. 180 tablet 3  . omeprazole (PRILOSEC) 20 MG capsule Take 20 mg by mouth daily.    . TRUEPLUS LANCETS 28G MISC Use as instructed. Check blood glucose levels 2 times per day 100 each 3   No facility-administered medications prior to visit.     No Known Allergies     Objective:    BP 135/87 (BP Location: Right Arm, Patient Position: Sitting, Cuff Size: Large)   Pulse 76   Temp  98.2 F (36.8 C) (Oral)   Ht '5\' 8"'  (1.727 m)   Wt 233 lb 12.8 oz (106.1 kg)   SpO2 96%   BMI 35.55 kg/m  Wt Readings from Last 3 Encounters:  03/13/18 233 lb 12.8 oz (106.1 kg)  03/10/18 235 lb 4.8 oz (106.7 kg)  01/24/18 240 lb (108.9 kg)    Physical Exam Vitals signs and nursing note reviewed.  Constitutional:      Appearance: He is well-developed.  HENT:     Head: Normocephalic and atraumatic.  Neck:     Musculoskeletal: Normal range of motion.  Cardiovascular:     Rate and Rhythm: Normal rate and regular rhythm.     Heart sounds: Normal heart sounds. No murmur. No friction rub. No gallop.   Pulmonary:     Effort: Pulmonary effort is normal. No tachypnea or respiratory distress.     Breath sounds: Normal breath sounds. No decreased breath sounds, wheezing, rhonchi or rales.  Chest:     Chest wall: No tenderness.  Abdominal:     General: Bowel sounds are normal.     Palpations: Abdomen is soft.  Musculoskeletal: Normal range of motion.  Skin:    General: Skin is warm and dry.  Neurological:     Mental Status: He is alert and oriented to person, place, and time.     Coordination: Coordination  normal.     Gait: Gait abnormal (uses cane).  Psychiatric:        Behavior: Behavior normal. Behavior is cooperative.        Thought Content: Thought content normal.        Judgment: Judgment normal.         Patient has been counseled extensively about nutrition and exercise as well as the importance of adherence with medications and regular follow-up. The patient was given clear instructions to go to ER or return to medical center if symptoms don't improve, worsen or new problems develop. The patient verbalized understanding.   Follow-up: Return in about 3 months (around 06/11/2018) for DM.   Gildardo Pounds, FNP-BC [facility] [place], [place]   03/16/2018, 11:33 PM

## 2018-03-14 LAB — URIC ACID: Uric Acid: 4.6 mg/dL (ref 3.7–8.6)

## 2018-03-15 ENCOUNTER — Other Ambulatory Visit (INDEPENDENT_AMBULATORY_CARE_PROVIDER_SITE_OTHER): Payer: Self-pay | Admitting: Orthopaedic Surgery

## 2018-03-16 ENCOUNTER — Telehealth: Payer: Self-pay

## 2018-03-16 ENCOUNTER — Encounter: Payer: Self-pay | Admitting: Nurse Practitioner

## 2018-03-16 NOTE — Telephone Encounter (Signed)
CMA spoke to patient to inform on results.  Pt. Understood.  Pt verified DOB.  

## 2018-03-16 NOTE — Telephone Encounter (Signed)
-----   Message from Claiborne Rigg, NP sent at 03/16/2018 12:01 AM EST ----- Uric acid normal. Continue on current dose of allopurinol

## 2018-03-17 ENCOUNTER — Encounter (HOSPITAL_COMMUNITY)
Admission: RE | Disposition: A | Discharge status: Home / Self Care | Payer: Self-pay | Source: Home / Self Care | Attending: Orthopaedic Surgery

## 2018-03-17 ENCOUNTER — Other Ambulatory Visit: Payer: Self-pay

## 2018-03-17 ENCOUNTER — Inpatient Hospital Stay (INDEPENDENT_AMBULATORY_CARE_PROVIDER_SITE_OTHER): Payer: Self-pay | Admitting: Orthopaedic Surgery

## 2018-03-17 ENCOUNTER — Ambulatory Visit (HOSPITAL_COMMUNITY): Payer: Medicaid Other

## 2018-03-17 ENCOUNTER — Encounter (HOSPITAL_COMMUNITY): Payer: Self-pay

## 2018-03-17 ENCOUNTER — Ambulatory Visit (HOSPITAL_COMMUNITY): Payer: Medicaid Other | Admitting: Certified Registered Nurse Anesthetist

## 2018-03-17 ENCOUNTER — Ambulatory Visit (HOSPITAL_COMMUNITY): Payer: Medicaid Other | Admitting: Physician Assistant

## 2018-03-17 ENCOUNTER — Observation Stay (HOSPITAL_COMMUNITY)
Admission: RE | Admit: 2018-03-17 | Discharge: 2018-03-18 | Disposition: A | Discharge status: Home / Self Care | Payer: Medicaid Other | Attending: Orthopaedic Surgery | Admitting: Orthopaedic Surgery

## 2018-03-17 DIAGNOSIS — Z79899 Other long term (current) drug therapy: Secondary | ICD-10-CM | POA: Diagnosis not present

## 2018-03-17 DIAGNOSIS — I10 Essential (primary) hypertension: Secondary | ICD-10-CM | POA: Insufficient documentation

## 2018-03-17 DIAGNOSIS — M4802 Spinal stenosis, cervical region: Secondary | ICD-10-CM | POA: Diagnosis not present

## 2018-03-17 DIAGNOSIS — M109 Gout, unspecified: Secondary | ICD-10-CM | POA: Diagnosis not present

## 2018-03-17 DIAGNOSIS — Z7984 Long term (current) use of oral hypoglycemic drugs: Secondary | ICD-10-CM | POA: Diagnosis not present

## 2018-03-17 DIAGNOSIS — M50121 Cervical disc disorder at C4-C5 level with radiculopathy: Principal | ICD-10-CM | POA: Insufficient documentation

## 2018-03-17 DIAGNOSIS — Z419 Encounter for procedure for purposes other than remedying health state, unspecified: Secondary | ICD-10-CM

## 2018-03-17 DIAGNOSIS — E119 Type 2 diabetes mellitus without complications: Secondary | ICD-10-CM | POA: Diagnosis not present

## 2018-03-17 DIAGNOSIS — Z6834 Body mass index (BMI) 34.0-34.9, adult: Secondary | ICD-10-CM | POA: Diagnosis not present

## 2018-03-17 DIAGNOSIS — E669 Obesity, unspecified: Secondary | ICD-10-CM | POA: Diagnosis not present

## 2018-03-17 DIAGNOSIS — E7800 Pure hypercholesterolemia, unspecified: Secondary | ICD-10-CM | POA: Diagnosis not present

## 2018-03-17 DIAGNOSIS — K219 Gastro-esophageal reflux disease without esophagitis: Secondary | ICD-10-CM | POA: Insufficient documentation

## 2018-03-17 DIAGNOSIS — M1990 Unspecified osteoarthritis, unspecified site: Secondary | ICD-10-CM | POA: Diagnosis not present

## 2018-03-17 DIAGNOSIS — E782 Mixed hyperlipidemia: Secondary | ICD-10-CM

## 2018-03-17 HISTORY — PX: ANTERIOR CERVICAL DECOMP/DISCECTOMY FUSION: SHX1161

## 2018-03-17 LAB — GLUCOSE, CAPILLARY
GLUCOSE-CAPILLARY: 129 mg/dL — AB (ref 70–99)
Glucose-Capillary: 103 mg/dL — ABNORMAL HIGH (ref 70–99)
Glucose-Capillary: 162 mg/dL — ABNORMAL HIGH (ref 70–99)
Glucose-Capillary: 170 mg/dL — ABNORMAL HIGH (ref 70–99)

## 2018-03-17 SURGERY — ANTERIOR CERVICAL DECOMPRESSION/DISCECTOMY FUSION 2 LEVELS
Anesthesia: General

## 2018-03-17 MED ORDER — OXYCODONE HCL 5 MG PO TABS: 5.0000 mg | ORAL_TABLET | Freq: Once | ORAL | Status: DC | PRN

## 2018-03-17 MED ORDER — LACTATED RINGERS IV SOLN
INTRAVENOUS | Status: DC | PRN
  Administered 2018-03-17 (×2): via INTRAVENOUS

## 2018-03-17 MED ORDER — EPINEPHRINE PF 1 MG/ML IJ SOLN
INTRAMUSCULAR | Status: AC
  Filled 2018-03-17: qty 1

## 2018-03-17 MED ORDER — FENTANYL CITRATE (PF) 100 MCG/2ML IJ SOLN
INTRAMUSCULAR | Status: DC | PRN
  Administered 2018-03-17: 50 ug via INTRAVENOUS
  Administered 2018-03-17: 100 ug via INTRAVENOUS
  Administered 2018-03-17 (×2): 50 ug via INTRAVENOUS

## 2018-03-17 MED ORDER — ACETAMINOPHEN 650 MG RE SUPP: 650.0000 mg | RECTAL | Status: DC | PRN

## 2018-03-17 MED ORDER — FENTANYL CITRATE (PF) 100 MCG/2ML IJ SOLN
INTRAMUSCULAR | Status: AC
  Filled 2018-03-17: qty 2

## 2018-03-17 MED ORDER — OXYCODONE-ACETAMINOPHEN 5-325 MG PO TABS: 1.0000 | ORAL_TABLET | Freq: Four times a day (QID) | ORAL | 0 refills | Status: DC | PRN

## 2018-03-17 MED ORDER — SODIUM CHLORIDE 0.9 % IV SOLN: 250.0000 mL | INTRAVENOUS | Status: DC

## 2018-03-17 MED ORDER — ROCURONIUM BROMIDE 50 MG/5ML IV SOSY
PREFILLED_SYRINGE | INTRAVENOUS | Status: DC | PRN
  Administered 2018-03-17: 10 mg via INTRAVENOUS
  Administered 2018-03-17: 30 mg via INTRAVENOUS

## 2018-03-17 MED ORDER — SODIUM CHLORIDE 0.9% FLUSH: 3.0000 mL | Freq: Two times a day (BID) | INTRAVENOUS | Status: DC

## 2018-03-17 MED ORDER — PROPOFOL 10 MG/ML IV BOLUS
INTRAVENOUS | Status: DC | PRN
  Administered 2018-03-17: 170 mg via INTRAVENOUS

## 2018-03-17 MED ORDER — ALLOPURINOL 100 MG PO TABS
200.0000 mg | ORAL_TABLET | Freq: Every day | ORAL | Status: DC
  Filled 2018-03-17: qty 2

## 2018-03-17 MED ORDER — ACETAMINOPHEN 325 MG PO TABS: 650.0000 mg | ORAL_TABLET | ORAL | Status: DC | PRN

## 2018-03-17 MED ORDER — CEFAZOLIN SODIUM-DEXTROSE 2-4 GM/100ML-% IV SOLN
2.0000 g | INTRAVENOUS | Status: AC
  Administered 2018-03-17: 2 g via INTRAVENOUS
  Filled 2018-03-17: qty 100

## 2018-03-17 MED ORDER — HEMOSTATIC AGENTS (NO CHARGE) OPTIME
TOPICAL | Status: DC | PRN
  Administered 2018-03-17: 1 via TOPICAL

## 2018-03-17 MED ORDER — FENTANYL CITRATE (PF) 250 MCG/5ML IJ SOLN
INTRAMUSCULAR | Status: AC
  Filled 2018-03-17: qty 5

## 2018-03-17 MED ORDER — SUCCINYLCHOLINE CHLORIDE 200 MG/10ML IV SOSY
PREFILLED_SYRINGE | INTRAVENOUS | Status: DC | PRN
  Administered 2018-03-17: 120 mg via INTRAVENOUS

## 2018-03-17 MED ORDER — PROPOFOL 10 MG/ML IV BOLUS
INTRAVENOUS | Status: AC
  Filled 2018-03-17: qty 40

## 2018-03-17 MED ORDER — ONDANSETRON HCL 4 MG PO TABS: 4.0000 mg | ORAL_TABLET | Freq: Four times a day (QID) | ORAL | Status: DC | PRN

## 2018-03-17 MED ORDER — PROMETHAZINE HCL 25 MG/ML IJ SOLN
6.2500 mg | Freq: Once | INTRAMUSCULAR | Status: AC
  Administered 2018-03-17: 6.25 mg via INTRAVENOUS

## 2018-03-17 MED ORDER — SUCCINYLCHOLINE CHLORIDE 200 MG/10ML IV SOSY
PREFILLED_SYRINGE | INTRAVENOUS | Status: AC
  Filled 2018-03-17: qty 10

## 2018-03-17 MED ORDER — LIDOCAINE 2% (20 MG/ML) 5 ML SYRINGE
INTRAMUSCULAR | Status: DC | PRN
  Administered 2018-03-17: 60 mg via INTRAVENOUS

## 2018-03-17 MED ORDER — EPINEPHRINE PF 1 MG/ML IJ SOLN
INTRAMUSCULAR | Status: DC | PRN
  Administered 2018-03-17: .15 mL

## 2018-03-17 MED ORDER — LISINOPRIL 10 MG PO TABS
10.0000 mg | ORAL_TABLET | Freq: Every day | ORAL | Status: DC
  Administered 2018-03-17: 10 mg via ORAL

## 2018-03-17 MED ORDER — PHENOL 1.4 % MT LIQD: 1.0000 | OROMUCOSAL | Status: DC | PRN

## 2018-03-17 MED ORDER — HYDROMORPHONE HCL 1 MG/ML IJ SOLN: 0.5000 mg | INTRAMUSCULAR | Status: DC | PRN

## 2018-03-17 MED ORDER — METHOCARBAMOL 500 MG PO TABS
500.0000 mg | ORAL_TABLET | Freq: Four times a day (QID) | ORAL | Status: DC | PRN
  Administered 2018-03-17 – 2018-03-18 (×4): 500 mg via ORAL
  Filled 2018-03-17 (×4): qty 1

## 2018-03-17 MED ORDER — ONDANSETRON HCL 4 MG/2ML IJ SOLN
INTRAMUSCULAR | Status: AC
  Filled 2018-03-17: qty 2

## 2018-03-17 MED ORDER — BUPIVACAINE HCL (PF) 0.25 % IJ SOLN
INTRAMUSCULAR | Status: AC
  Filled 2018-03-17: qty 30

## 2018-03-17 MED ORDER — ONDANSETRON HCL 4 MG/2ML IJ SOLN: 4.0000 mg | Freq: Four times a day (QID) | INTRAMUSCULAR | Status: DC | PRN

## 2018-03-17 MED ORDER — MIDAZOLAM HCL 2 MG/2ML IJ SOLN
INTRAMUSCULAR | Status: DC | PRN
  Administered 2018-03-17: 2 mg via INTRAVENOUS

## 2018-03-17 MED ORDER — SUGAMMADEX SODIUM 200 MG/2ML IV SOLN
INTRAVENOUS | Status: DC | PRN
  Administered 2018-03-17: 206 mg via INTRAVENOUS

## 2018-03-17 MED ORDER — MIDAZOLAM HCL 2 MG/2ML IJ SOLN
INTRAMUSCULAR | Status: AC
  Filled 2018-03-17: qty 2

## 2018-03-17 MED ORDER — METHOCARBAMOL 1000 MG/10ML IJ SOLN
500.0000 mg | Freq: Four times a day (QID) | INTRAVENOUS | Status: DC | PRN
  Filled 2018-03-17: qty 5

## 2018-03-17 MED ORDER — HYDROCHLOROTHIAZIDE 12.5 MG PO CAPS
12.5000 mg | ORAL_CAPSULE | Freq: Every day | ORAL | Status: DC
  Administered 2018-03-17: 12.5 mg via ORAL

## 2018-03-17 MED ORDER — ONDANSETRON HCL 4 MG/2ML IJ SOLN
INTRAMUSCULAR | Status: DC | PRN
  Administered 2018-03-17 (×2): 4 mg via INTRAVENOUS

## 2018-03-17 MED ORDER — DEXAMETHASONE SODIUM PHOSPHATE 10 MG/ML IJ SOLN
INTRAMUSCULAR | Status: AC
  Filled 2018-03-17: qty 1

## 2018-03-17 MED ORDER — SODIUM CHLORIDE 0.9% FLUSH: 3.0000 mL | INTRAVENOUS | Status: DC | PRN

## 2018-03-17 MED ORDER — POLYETHYLENE GLYCOL 3350 17 G PO PACK: 17.0000 g | PACK | Freq: Every day | ORAL | Status: DC

## 2018-03-17 MED ORDER — DOCUSATE SODIUM 100 MG PO CAPS
100.0000 mg | ORAL_CAPSULE | Freq: Two times a day (BID) | ORAL | Status: DC
  Administered 2018-03-17: 100 mg via ORAL
  Filled 2018-03-17: qty 1

## 2018-03-17 MED ORDER — MENTHOL 3 MG MT LOZG: 1.0000 | LOZENGE | OROMUCOSAL | Status: DC | PRN

## 2018-03-17 MED ORDER — DEXAMETHASONE SODIUM PHOSPHATE 10 MG/ML IJ SOLN
INTRAMUSCULAR | Status: DC | PRN
  Administered 2018-03-17: 10 mg via INTRAVENOUS

## 2018-03-17 MED ORDER — LISINOPRIL-HYDROCHLOROTHIAZIDE 10-12.5 MG PO TABS: 1.0000 | ORAL_TABLET | Freq: Every day | ORAL | Status: DC

## 2018-03-17 MED ORDER — OXYCODONE HCL 5 MG/5ML PO SOLN: 5.0000 mg | Freq: Once | ORAL | Status: DC | PRN

## 2018-03-17 MED ORDER — ROCURONIUM BROMIDE 50 MG/5ML IV SOSY
PREFILLED_SYRINGE | INTRAVENOUS | Status: AC
  Filled 2018-03-17: qty 5

## 2018-03-17 MED ORDER — PANTOPRAZOLE SODIUM 40 MG PO TBEC
40.0000 mg | DELAYED_RELEASE_TABLET | Freq: Every day | ORAL | Status: DC
  Administered 2018-03-18: 40 mg via ORAL
  Filled 2018-03-17: qty 1

## 2018-03-17 MED ORDER — ATORVASTATIN CALCIUM 40 MG PO TABS
40.0000 mg | ORAL_TABLET | Freq: Every day | ORAL | Status: DC
  Administered 2018-03-17: 40 mg via ORAL
  Filled 2018-03-17: qty 1

## 2018-03-17 MED ORDER — METHOCARBAMOL 500 MG PO TABS: 500.0000 mg | ORAL_TABLET | Freq: Three times a day (TID) | ORAL | 0 refills | Status: DC | PRN

## 2018-03-17 MED ORDER — PROMETHAZINE HCL 25 MG/ML IJ SOLN
INTRAMUSCULAR | Status: AC
  Filled 2018-03-17: qty 1

## 2018-03-17 MED ORDER — LIDOCAINE 2% (20 MG/ML) 5 ML SYRINGE
INTRAMUSCULAR | Status: AC
  Filled 2018-03-17: qty 5

## 2018-03-17 MED ORDER — METFORMIN HCL 500 MG PO TABS
500.0000 mg | ORAL_TABLET | Freq: Two times a day (BID) | ORAL | Status: DC
  Administered 2018-03-17 – 2018-03-18 (×2): 500 mg via ORAL
  Filled 2018-03-17 (×2): qty 1

## 2018-03-17 MED ORDER — OXYCODONE HCL 5 MG PO TABS
5.0000 mg | ORAL_TABLET | ORAL | Status: DC | PRN
  Administered 2018-03-17 – 2018-03-18 (×5): 5 mg via ORAL
  Filled 2018-03-17 (×5): qty 1

## 2018-03-17 MED ORDER — SODIUM CHLORIDE 0.9 % IV SOLN: INTRAVENOUS | Status: DC

## 2018-03-17 MED ORDER — SODIUM CHLORIDE 0.9 % IV SOLN
INTRAVENOUS | Status: DC | PRN
  Administered 2018-03-17: 25 ug/min via INTRAVENOUS

## 2018-03-17 MED ORDER — CEFAZOLIN SODIUM-DEXTROSE 1-4 GM/50ML-% IV SOLN
1.0000 g | Freq: Three times a day (TID) | INTRAVENOUS | Status: AC
  Administered 2018-03-17 (×2): 1 g via INTRAVENOUS
  Filled 2018-03-17 (×2): qty 50

## 2018-03-17 MED ORDER — CHLORHEXIDINE GLUCONATE 4 % EX LIQD: 60.0000 mL | Freq: Once | CUTANEOUS | Status: DC

## 2018-03-17 MED ORDER — FENTANYL CITRATE (PF) 100 MCG/2ML IJ SOLN
25.0000 ug | INTRAMUSCULAR | Status: DC | PRN
  Administered 2018-03-17: 50 ug via INTRAVENOUS

## 2018-03-17 MED ORDER — 0.9 % SODIUM CHLORIDE (POUR BTL) OPTIME
TOPICAL | Status: DC | PRN
  Administered 2018-03-17 (×2): 1000 mL

## 2018-03-17 SURGICAL SUPPLY — 62 items
BENZOIN TINCTURE PRP APPL 2/3 (GAUZE/BANDAGES/DRESSINGS) ×3 IMPLANT
BIT DRILL SRG 14X2.2XFLT CHK (BIT) ×1 IMPLANT
BIT DRL SRG 14X2.2XFLT CHK (BIT) ×1
BLADE CLIPPER SURG (BLADE) IMPLANT
BONE CERV LORDOTIC 14.5X12X7 (Bone Implant) ×6 IMPLANT
BUR ROUND FLUTED 4 SOFT TCH (BURR) IMPLANT
BUR ROUND FLUTED 4MM SOFT TCH (BURR)
CLOSURE WOUND 1/2 X4 (GAUZE/BANDAGES/DRESSINGS) ×1
COLLAR CERV LO CONTOUR FIRM DE (SOFTGOODS) ×3 IMPLANT
CORD BIPOLAR FORCEPS 12FT (ELECTRODE) ×3 IMPLANT
COVER SURGICAL LIGHT HANDLE (MISCELLANEOUS) ×3 IMPLANT
COVER WAND RF STERILE (DRAPES) ×3 IMPLANT
CRADLE DONUT ADULT HEAD (MISCELLANEOUS) ×3 IMPLANT
DRAPE C-ARM 42X72 X-RAY (DRAPES) ×3 IMPLANT
DRAPE HALF SHEET 40X57 (DRAPES) ×3 IMPLANT
DRAPE MICROSCOPE LEICA (MISCELLANEOUS) ×3 IMPLANT
DRILL BIT SKYLINE 14MM (BIT) ×2
DURAPREP 6ML APPLICATOR 50/CS (WOUND CARE) ×3 IMPLANT
ELECT COATED BLADE 2.86 ST (ELECTRODE) ×3 IMPLANT
ELECT REM PT RETURN 9FT ADLT (ELECTROSURGICAL) ×3
ELECTRODE REM PT RTRN 9FT ADLT (ELECTROSURGICAL) ×1 IMPLANT
EVACUATOR 1/8 PVC DRAIN (DRAIN) ×3 IMPLANT
GAUZE SPONGE 4X4 12PLY STRL (GAUZE/BANDAGES/DRESSINGS) ×3 IMPLANT
GLOVE BIOGEL PI IND STRL 6 (GLOVE) ×1 IMPLANT
GLOVE BIOGEL PI IND STRL 8 (GLOVE) ×3 IMPLANT
GLOVE BIOGEL PI INDICATOR 6 (GLOVE) ×2
GLOVE BIOGEL PI INDICATOR 8 (GLOVE) ×6
GLOVE BIOGEL PI ORTHO PRO 7.5 (GLOVE) ×4
GLOVE ORTHO TXT STRL SZ7.5 (GLOVE) ×6 IMPLANT
GLOVE PI ORTHO PRO STRL 7.5 (GLOVE) ×2 IMPLANT
GLOVE SURG SS PI 6.5 STRL IVOR (GLOVE) ×3 IMPLANT
GLOVE SURG SS PI 8.0 STRL IVOR (GLOVE) ×6 IMPLANT
GOWN STRL REUS W/ TWL LRG LVL3 (GOWN DISPOSABLE) ×3 IMPLANT
GOWN STRL REUS W/ TWL XL LVL3 (GOWN DISPOSABLE) ×1 IMPLANT
GOWN STRL REUS W/TWL 2XL LVL3 (GOWN DISPOSABLE) ×3 IMPLANT
GOWN STRL REUS W/TWL LRG LVL3 (GOWN DISPOSABLE) ×6
GOWN STRL REUS W/TWL XL LVL3 (GOWN DISPOSABLE) ×2
HEAD HALTER (SOFTGOODS) ×3 IMPLANT
HEMOSTAT SURGICEL 2X14 (HEMOSTASIS) IMPLANT
KIT BASIN OR (CUSTOM PROCEDURE TRAY) ×3 IMPLANT
KIT TURNOVER KIT B (KITS) ×3 IMPLANT
MANIFOLD NEPTUNE II (INSTRUMENTS) IMPLANT
NEEDLE 25GX 5/8IN NON SAFETY (NEEDLE) ×3 IMPLANT
NEEDLE HYPO 25GX1X1/2 BEV (NEEDLE) ×3 IMPLANT
NEEDLE SPNL 18GX3.5 QUINCKE PK (NEEDLE) ×3 IMPLANT
NS IRRIG 1000ML POUR BTL (IV SOLUTION) ×3 IMPLANT
PACK ORTHO CERVICAL (CUSTOM PROCEDURE TRAY) ×3 IMPLANT
PAD ARMBOARD 7.5X6 YLW CONV (MISCELLANEOUS) ×6 IMPLANT
PATTIES SURGICAL .5 X.5 (GAUZE/BANDAGES/DRESSINGS) IMPLANT
PIN TEMP SKYLINE THREADED (PIN) ×3 IMPLANT
PLATE SKYLINE TWO LEVEL 32MM (Plate) ×3 IMPLANT
RESTRAINT LIMB HOLDER UNIV (RESTRAINTS) IMPLANT
SCREW VAR SELF TAP SKYLINE 14M (Screw) ×18 IMPLANT
STRIP CLOSURE SKIN 1/2X4 (GAUZE/BANDAGES/DRESSINGS) ×2 IMPLANT
SURGIFLO W/THROMBIN 8M KIT (HEMOSTASIS) ×3 IMPLANT
SUT BONE WAX W31G (SUTURE) ×3 IMPLANT
SUT VIC AB 3-0 X1 27 (SUTURE) ×3 IMPLANT
SUT VICRYL 4-0 PS2 18IN ABS (SUTURE) ×6 IMPLANT
TAPE CLOTH SURG 6X10 WHT LF (GAUZE/BANDAGES/DRESSINGS) ×3 IMPLANT
TOWEL OR 17X24 6PK STRL BLUE (TOWEL DISPOSABLE) ×3 IMPLANT
TOWEL OR 17X26 10 PK STRL BLUE (TOWEL DISPOSABLE) ×3 IMPLANT
TRAY FOLEY CATH SILVER 16FR (SET/KITS/TRAYS/PACK) IMPLANT

## 2018-03-17 NOTE — Op Note (Signed)
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  Postop diagnosis: Same  Procedure: C4-5, C5-6 anterior cervical discectomy and fusion, allograft and plate.  Surgeon: Annell Greening, MD  Assistant: Zonia Kief, PA-C medically necessary and present for the critical portion of the surgery until wound closure.  Second assist: Patrick Jupiter RNFA  Anesthesia: General +6 cc Marcaine with epi local  EBL: 100 cc  Drains: 1 Hemovac neck.  Procedure after induction general anesthesia intubation with a glide scope had ultra traction was applied without weight arms were tucked at the side and some tape was used on the upper chest for pulldown due to the patient's body habitus.  Arms were tucked with yellow pads DuraPrep was used sterile male standard of the head area squared with towel sterile skin marker Betadine Steri-Drape thyroid sheets and drapes half sheet above timeout procedure and Ancef prophylaxis.  Incision was made starting at the midline extending the left platysma was divided in line with incision blunt dissection down to the level of the longus Coley was performed prominent spur at C5-6 was noted and a short 25 needle was placed at the level above which should be 4 5.  This was confirmed with crosstable sterilely draped C arm image confirming were at C4-5.  This level had more stenosis in the C5-6 level and was approached for self retraining retractors were placed deep blades right and left with plates above and below cephalocaudad.  Discectomy was performed operative microscope was brought in for dissection down to the posterior aspect.  Overhanging spurs were present with shingling that was removed decompressing the spinal cord chunks of posterior longitudinal ligament were removed until the dura was completely deep decompressed.  Trial sizers with a 6 and 7 mm showed 7 mm spacer gave good fit.  Graft was opened marked at the midline.  CRNA pulled on traction graft was  countersunk 2 mm.  Identical procedure was repeated at the C5-6 level.  At this level there is about a 1 mm disc space progressing back due to significant spondylosis overhanging spurs removed uncovertebral joints were stripped with a Cloward curettes.  Spurs removed off the dura decompressing the dura from the area of spinal stenosis and then a 7 mm graft was placed at this level also.  Synthes skyline 32 mm 2 level plate was selected checked under C arm held with the spike and then adjusted.  Final spot pictures after insertion of 2 screws showed excellent position and then the remaining 4 screws were placed for a total of 614 mm screws with final spot pictures taken.  Plate was exactly midline good screw position.  Tiny screwdriver was used to lock all screws and.  Operative field was irrigated Hemovac was placed in line with the skin incision on the left platysma closed with 3-0 Vicryl 4-0 Vicryl subcuticular closure tincture benzoin Steri-Strips 4 x 4's tape and soft cervical collar with collar extender due to body habitus.  Patient tolerated procedure well transfer the care of room in stable condition.

## 2018-03-17 NOTE — H&P (Signed)
Christopher Hurst. is an 53 y.o. male.   Chief Complaint: neck pain and upper extremity radiclopathy HPI:  Patient with hx of C4-5 and C5-6 stenosis/HNP and the above complaint presents for surgical intervention.  Progressively worsening symptoms.  Failed conservative treatment.    Past Medical History:  Diagnosis Date  . Acid reflux   . Arthritis   . Diabetes mellitus without complication ([place])   . Gout   . High cholesterol   . Hypertension   . Obesity   . Pneumonia     Past Surgical History:  Procedure Laterality Date  . CYSTECTOMY     testicle   . KNEE ARTHROSCOPY     left two knee surgery   . SHOULDER ARTHROSCOPY     one left shoulder   . TONSILLECTOMY      Family History  Problem Relation Age of Onset  . Cirrhosis Father   . Stroke Father   . Hypertension Brother    Social History:  reports that he has never smoked. He has never used smokeless tobacco. He reports previous alcohol use. He reports that he does not use drugs.  Allergies: No Known Allergies  Medications Prior to Admission  Medication Sig Dispense Refill  . acetaminophen (TYLENOL) 500 MG tablet Take 1,000 mg by mouth every 8 (eight) hours as needed (pain).    Marland Kitchen lisinopril-hydrochlorothiazide (ZESTORETIC) 10-12.5 MG tablet Take 1 tablet by mouth daily. 90 tablet 3  . allopurinol (ZYLOPRIM) 100 MG tablet Take 2 tablets (200 mg total) by mouth daily. 90 tablet 2  . atorvastatin (LIPITOR) 40 MG tablet Take 1 tablet (40 mg total) by mouth daily. 90 tablet 3  . Blood Glucose Monitoring Suppl (TRUE METRIX METER) w/Device KIT Use as instructed. Check blood glucose levels 2 times per day 1 kit 0  . glucose blood (TRUE METRIX BLOOD GLUCOSE TEST) test strip Use as instructed. Check blood glucose levels 2 times per day 100 each 11  . metFORMIN (GLUCOPHAGE) 500 MG tablet Take 1 tablet (500 mg total) by mouth 2 (two) times daily with a meal. 180 tablet 3  . omeprazole (PRILOSEC) 20 MG capsule Take 1 capsule (20 mg  total) by mouth daily. 90 capsule 2  . TRUEPLUS LANCETS 28G MISC Use as instructed. Check blood glucose levels 2 times per day 100 each 3    Results for orders placed or performed during the hospital encounter of 03/17/18 (from the past 48 hour(s))  Glucose, capillary     Status: Abnormal   Collection Time: 03/17/18  6:13 AM  Result Value Ref Range   Glucose-Capillary 103 (H) 70 - 99 mg/dL   Comment 1 Notify RN    Comment 2 Document in Chart    No results found.  Review of Systems  Constitutional: Negative.   HENT: Negative.   Respiratory: Negative.   Cardiovascular: Negative.   Musculoskeletal: Positive for neck pain.  Neurological: Positive for tingling.  Psychiatric/Behavioral: Negative.     Blood pressure 134/87, pulse 87, temperature 98.4 F (36.9 C), temperature source Oral, resp. rate 18, height 5' 8" (1.727 m), weight 103 kg, SpO2 99 %. Physical Exam  Constitutional: He is oriented to person, place, and time. He appears well-developed. No distress.  HENT:  Head: Normocephalic.  Eyes: Pupils are equal, round, and reactive to light. EOM are normal.  GI: He exhibits no distension.  Musculoskeletal:        General: Tenderness present.  Neurological: He is alert and oriented to person, place,  and time.  Skin: Skin is warm and dry.  Psychiatric: He has a normal mood and affect.     Assessment/Plan C4-5 and C5-6 HNP/stenosis  We will proceed with C4-5, C5-6 ANTERIOR CERVICAL DECOMPRESSION/DISCECTOMY FUSION, ALLOGRAFT PLATE as scheduled.  Surgical procedure along with possible risks and complications discussed.  All questions answered and wishes to proceed    Benjiman Core, PA-C 03/17/2018, 6:27 AM

## 2018-03-17 NOTE — Anesthesia Postprocedure Evaluation (Signed)
Anesthesia Post Note  Patient: Christopher Hurst.  Procedure(s) Performed: CERVICAL FOUR-FIVE, CERVCAL FIVE-SIX ANTERIOR CERVICAL DECOMPRESSION/DISCECTOMY FUSION, ALLOGRAFT PLATE (N/A )     Patient location during evaluation: PACU Anesthesia Type: General Level of consciousness: awake and alert Pain management: pain level controlled Vital Signs Assessment: post-procedure vital signs reviewed and stable Respiratory status: spontaneous breathing, nonlabored ventilation, respiratory function stable and patient connected to nasal cannula oxygen Cardiovascular status: blood pressure returned to baseline and stable Postop Assessment: no apparent nausea or vomiting Anesthetic complications: no    Last Vitals:  Vitals:   03/17/18 1105 03/17/18 1124  BP: (!) 146/76 137/81  Pulse: 91 80  Resp: 15 19  Temp: 36.7 C (!) 36.4 C  SpO2: 94% 97%    Last Pain:  Vitals:   03/17/18 1124  TempSrc: Oral  PainSc:                  [name] S

## 2018-03-17 NOTE — Interval H&P Note (Signed)
History and Physical Interval Note:  03/17/2018 7:26 AM  Christopher Hurst.  has presented today for surgery, with the diagnosis of C4-5, C5-6 STENOSIS  The various methods of treatment have been discussed with the patient and family. After consideration of risks, benefits and other options for treatment, the patient has consented to  Procedure(s): C4-5, C5-6 ANTERIOR CERVICAL DECOMPRESSION/DISCECTOMY FUSION, ALLOGRAFT PLATE (N/A) as a surgical intervention .  The patient's history has been reviewed, patient examined, no change in status, stable for surgery.  I have reviewed the patient's chart and labs.  Questions were answered to the patient's satisfaction.     Eldred Manges

## 2018-03-17 NOTE — Discharge Instructions (Signed)
Keep collar on at all times except when you switch to extra collar with saran wrap around it to take a shower. After showering and drying off, remove saran wrapped collar and reapply dry collar. See Dr. Ophelia Charter in one week . Soft foods recommended , and sleep in a beach chair or recliner type position to decrease neck swelling and pain.

## 2018-03-17 NOTE — Progress Notes (Signed)
Orthopedic Tech Progress Note Patient Details:  Christopher Hurst. [birth year] [id number]  Ortho Devices Type of Ortho Device: Soft collar Ortho Device/Splint Location: delivered to room Ortho Device/Splint Interventions: Rich Brave 03/17/2018, 5:35 PM

## 2018-03-17 NOTE — Anesthesia Preprocedure Evaluation (Signed)
Anesthesia Evaluation  Patient identified by MRN, date of birth, ID band Patient awake    Reviewed: Allergy & Precautions, H&P , NPO status , Patient's Chart, lab work & pertinent test results  Airway Mallampati: II   Neck ROM: full    Dental   Pulmonary neg pulmonary ROS,    breath sounds clear to auscultation       Cardiovascular hypertension,  Rhythm:regular Rate:Normal     Neuro/Psych    GI/Hepatic GERD  ,  Endo/Other  diabetes, Type 2obese  Renal/GU      Musculoskeletal  (+) Arthritis ,   Abdominal   Peds  Hematology   Anesthesia Other Findings   Reproductive/Obstetrics                             Anesthesia Physical Anesthesia Plan  ASA: II  Anesthesia Plan: General   Post-op Pain Management:    Induction: Intravenous  PONV Risk Score and Plan: 2 and Ondansetron, Dexamethasone, Midazolam and Treatment may vary due to age or medical condition  Airway Management Planned: Oral ETT  Additional Equipment:   Intra-op Plan:   Post-operative Plan: Extubation in OR  Informed Consent: I have reviewed the patients History and Physical, chart, labs and discussed the procedure including the risks, benefits and alternatives for the proposed anesthesia with the patient or authorized representative who has indicated his/her understanding and acceptance.       Plan Discussed with: CRNA, Anesthesiologist and Surgeon  Anesthesia Plan Comments:         Anesthesia Quick Evaluation

## 2018-03-17 NOTE — Anesthesia Procedure Notes (Signed)
Procedure Name: Intubation Date/Time: 03/17/2018 7:43 AM Performed by: Pearson Grippe, CRNA Pre-anesthesia Checklist: Patient identified, Emergency Drugs available, Suction available and Patient being monitored Patient Re-evaluated:Patient Re-evaluated prior to induction Oxygen Delivery Method: Circle system utilized Preoxygenation: Pre-oxygenation with 100% oxygen Induction Type: IV induction Ventilation: Mask ventilation without difficulty Laryngoscope Size: Glidescope and 4 Grade View: Grade I Tube type: Oral Tube size: 7.5 mm Number of attempts: 1 Airway Equipment and Method: Stylet and Video-laryngoscopy Placement Confirmation: ETT inserted through vocal cords under direct vision,  positive ETCO2 and breath sounds checked- equal and bilateral Secured at: 23 cm Tube secured with: Tape Dental Injury: Teeth and Oropharynx as per pre-operative assessment  Difficulty Due To: Difficulty was anticipated, Difficult Airway- due to large tongue, Difficult Airway- due to reduced neck mobility and Difficult Airway- due to dentition Comments: Elective glidescope intubation

## 2018-03-17 NOTE — Progress Notes (Signed)
Robaxin and percocet sent in to [facility] [address]

## 2018-03-17 NOTE — Progress Notes (Signed)
Orthopedic Tech Progress Note Patient Details:  [name] December 24, 1965 [id number] RN said patient has collar Patient ID: Christopher Hurst., male   DOB: 30-Apr-1965, 53 y.o.   MRN: [id number]   Donald Pore 03/17/2018, 11:57 AM

## 2018-03-17 NOTE — Transfer of Care (Signed)
Immediate Anesthesia Transfer of Care Note  Patient: Christopher Hurst.  Procedure(s) Performed: CERVICAL FOUR-FIVE, CERVCAL FIVE-SIX ANTERIOR CERVICAL DECOMPRESSION/DISCECTOMY FUSION, ALLOGRAFT PLATE (N/A )  Patient Location: PACU  Anesthesia Type:General  Level of Consciousness: awake, alert  and oriented  Airway & Oxygen Therapy: Patient Spontanous Breathing and Patient connected to face mask oxygen  Post-op Assessment: Report given to RN and Post -op Vital signs reviewed and stable  Post vital signs: Reviewed and stable  Last Vitals:  Vitals Value Taken Time  BP 145/78 03/17/2018 10:20 AM  Temp    Pulse 88 03/17/2018 10:21 AM  Resp 14   SpO2 95 % 03/17/2018 10:21 AM  Vitals shown include unvalidated device data.  Last Pain:  Vitals:   03/17/18 0641  TempSrc:   PainSc: 8       Patients Stated Pain Goal: 2 (03/17/18 9147)  Complications: No apparent anesthesia complications

## 2018-03-18 DIAGNOSIS — M50121 Cervical disc disorder at C4-C5 level with radiculopathy: Secondary | ICD-10-CM | POA: Diagnosis not present

## 2018-03-18 DIAGNOSIS — M4802 Spinal stenosis, cervical region: Secondary | ICD-10-CM

## 2018-03-18 LAB — GLUCOSE, CAPILLARY: Glucose-Capillary: 91 mg/dL (ref 70–99)

## 2018-03-18 NOTE — Evaluation (Signed)
Occupational Therapy Evaluation Patient Details Name: Christopher Hurst. MRN: [id number] DOB: 11/17/1965 Today's Date: 03/18/2018    History of Present Illness Patient is a 53 yr old male who had  C4-5, C5-6 anterior cervical discectomy and fusion, allograft and plate. PMH: arthritis, knee arthroscopy, shoulder arthroscopy    Clinical Impression   Patient is s/p C4-5, C5-6 anterior cervical discectomy and fusion, allograft and plate surgery resulting in the deficits listed below (see OT Problem List). The patient lives at home with wife and son. The patient reported he has a cane at home but was collapsing on him. Therefore, issued cane to use when returning home. The patient reported he has a shower chair, grab bar on tub, and son order toilet riser. The patient was able to report 3/3 precautions. The patient was able educated on how to use reacher, shoe horn and sock aid on how to complete BLE dressing while following precautions. The patient reported they would purchase items. The patient at this time no longer needs acute occupational therapy services.       Follow Up Recommendations  No OT follow up;Supervision/Assistance - 24 hour    Equipment Recommendations  Toilet riser;Tub/shower seat(reported they have )    Recommendations for Other Services       Precautions / Restrictions Precautions Precautions: Cervical Precaution Booklet Issued: Yes (comment) Precaution Comments: pt was able to report 3/3 precautions  Required Braces or Orthoses: Cervical Brace Cervical Brace: Soft collar Restrictions Weight Bearing Restrictions: No      Mobility Bed Mobility Overal bed mobility: Modified Independent             General bed mobility comments: head of bed elevated  Transfers Overall transfer level: Modified independent Equipment used: Straight cane                  Balance Overall balance assessment: Modified Independent                                          ADL either performed or assessed with clinical judgement   ADL Overall ADL's : Needs assistance/impaired Eating/Feeding: Independent;Sitting   Grooming: Wash/dry hands;Modified independent   Upper Body Bathing: Modified independent;Sitting   Lower Body Bathing: Minimal assistance;Cueing for safety;Cueing for sequencing;Cueing for compensatory techniques   Upper Body Dressing : Modified independent;Sitting   Lower Body Dressing: Minimal assistance;Cueing for safety;Cueing for sequencing;Cueing for compensatory techniques   Toilet Transfer: Modified Independent;Grab bars   Toileting- Clothing Manipulation and Hygiene: Modified independent;Sit to/from stand   Tub/ Shower Transfer: Min guard;Cueing for safety;Cueing for sequencing   Functional mobility during ADLs: Modified independent;Cane       Vision Baseline Vision/History: No visual deficits Vision Assessment?: No apparent visual deficits     Development worker, international aid Tested?: No   Praxis Praxis Praxis tested?: Not tested    Pertinent Vitals/Pain Pain Assessment: Faces Faces Pain Scale: Hurts a little bit Pain Location: back Pain Descriptors / Indicators: Aching Pain Intervention(s): Limited activity within patient's tolerance;Repositioned;Patient requesting pain meds-RN notified;Monitored during session     Hand Dominance Right   Extremity/Trunk Assessment Upper Extremity Assessment Upper Extremity Assessment: Overall WFL for tasks assessed   Lower Extremity Assessment Lower Extremity Assessment: Generalized weakness;LLE deficits/detail   Cervical / Trunk Assessment Cervical / Trunk Assessment: Other exceptions   Communication Communication Communication: No difficulties   Cognition Arousal/Alertness: Awake/alert Behavior During Therapy:  WFL for tasks assessed/performed Overall Cognitive Status: Within Functional Limits for tasks assessed                                      General Comments       Exercises     Shoulder Instructions      Home Living Family/patient expects to be discharged to:: Private residence Living Arrangements: Spouse/significant other;Children Available Help at Discharge: Family;Available 24 hours/day Type of Home: House Home Access: Level entry     Home Layout: One level     Bathroom Shower/Tub: Tub/shower unit;Curtain   Firefighter: Standard Bathroom Accessibility: Yes How Accessible: Accessible via walker Home Equipment: Cane - single point;Shower seat(grab bar on tub and getting riser for toilet)          Prior Functioning/Environment Level of Independence: Needs assistance  Gait / Transfers Assistance Needed: (cane) ADL's / Homemaking Assistance Needed: occasionally assist with socks and shoes Communication / Swallowing Assistance Needed: none          OT Problem List: Decreased strength;Decreased safety awareness;Decreased knowledge of use of DME or AE;Pain      OT Treatment/Interventions:      OT Goals(Current goals can be found in the care plan section) Acute Rehab OT Goals Patient Stated Goal: to be able to put socks on OT Goal Formulation: With patient Time For Goal Achievement: 03/25/18 Potential to Achieve Goals: Good  OT Frequency:     Barriers to D/C:            Co-evaluation              AM-PAC OT "6 Clicks" Daily Activity     Outcome Measure Help from another person eating meals?: None Help from another person taking care of personal grooming?: None Help from another person toileting, which includes using toliet, bedpan, or urinal?: None Help from another person bathing (including washing, rinsing, drying)?: A Little Help from another person to put on and taking off regular upper body clothing?: A Little Help from another person to put on and taking off regular lower body clothing?: A Little 6 Click Score: 21   End of Session Equipment Utilized During  Treatment: Gait belt;Cervical collar Nurse Communication: Patient requests pain meds  Activity Tolerance: Patient tolerated treatment well;Patient limited by pain Patient left: in bed;with call bell/phone within reach  OT Visit Diagnosis: Pain;Unsteadiness on feet (R26.81) Pain - part of body: (back)                Time: 0820-0900 OT Time Calculation (min): 40 min Charges:  OT General Charges $OT Visit: 1 Visit OT Evaluation $OT Eval Low Complexity: 1 Low OT Treatments $Self Care/Home Management : 23-37 mins  Alphia Moh OTR/L  Acute Rehab Services  [phone] office number [phone] pager number   Alphia Moh 03/18/2018, 9:53 AM

## 2018-03-18 NOTE — Discharge Summary (Signed)
Discharge Diagnoses:  Active Problems:   Cervical spinal stenosis   Surgeries: Procedure(s): CERVICAL FOUR-FIVE, CERVCAL FIVE-SIX ANTERIOR CERVICAL DECOMPRESSION/DISCECTOMY FUSION, ALLOGRAFT PLATE on 1/61/0960    Consultants:   Discharged Condition: Improved  Hospital Course: Christopher Weedon. is an 53 y.o. male who was admitted 03/17/2018 with a chief complaint of cervical radiculopathy, with a final diagnosis of Cervical four-five, Cervical five-six STENOSIS.  Patient was brought to the operating room on 03/17/2018 and underwent Procedure(s): CERVICAL FOUR-FIVE, CERVCAL FIVE-SIX ANTERIOR CERVICAL DECOMPRESSION/DISCECTOMY FUSION, ALLOGRAFT PLATE.    Patient was given perioperative antibiotics:  Anti-infectives (From admission, onward)   Start     Dose/Rate Route Frequency Ordered Stop   03/17/18 1600  ceFAZolin (ANCEF) IVPB 1 g/50 mL premix     1 g 100 mL/hr over 30 Minutes Intravenous Every 8 hours 03/17/18 1133 03/17/18 2057   03/17/18 0630  ceFAZolin (ANCEF) IVPB 2g/100 mL premix     2 g 200 mL/hr over 30 Minutes Intravenous On call to O.R. 03/17/18 4540 03/17/18 0818    .  Patient was given sequential compression devices, early ambulation, and aspirin for DVT prophylaxis.  Recent vital signs:  Patient Vitals for the past 24 hrs:  BP Temp Temp src Pulse Resp SpO2  03/18/18 0728 138/81 97.7 F (36.5 C) Oral 78 16 99 %  03/18/18 0319 123/85 97.8 F (36.6 C) Oral 79 18 99 %  03/17/18 2324 (!) 147/99 97.7 F (36.5 C) Oral 86 18 97 %  03/17/18 2011 (!) 149/88 98.2 F (36.8 C) Oral 99 20 96 %  03/17/18 1606 (!) 141/91 98.1 F (36.7 C) Oral 94 16 96 %  03/17/18 1124 137/81 (!) 97.5 F (36.4 C) Oral 80 19 97 %  03/17/18 1105 (!) 146/76 98.1 F (36.7 C) - 91 15 94 %  03/17/18 1058 - - - 88 16 98 %  03/17/18 1053 - - - - - (!) 88 %  03/17/18 1050 (!) 152/96 - - 91 (!) 7 95 %  03/17/18 1035 (!) 160/81 - - 79 18 96 %  03/17/18 1020 (!) 145/78 (!) 97.5 F (36.4 C) - 93 (!)  21 98 %  .  Recent laboratory studies: Dg Cervical Spine 2 Or 3 Views  Result Date: 03/17/2018 CLINICAL DATA:  Cervical fusion. EXAM: DG C-ARM 61-120 MIN; CERVICAL SPINE - 2-3 VIEW COMPARISON:  03/17/2018 FINDINGS: Intraoperative images of cervical spine were obtained. Endotracheal tube is present. Anterior plate and screw fixation at C4, C5 and C6. IMPRESSION: Anterior cervical fusion at C4 through C6. Electronically Signed   By: Markus Daft M.D.   On: 03/17/2018 12:10   Dg Cervical Spine 2 Or 3 Views  Result Date: 03/17/2018 CLINICAL DATA:  Preop for cervical spine surgery. EXAM: CERVICAL SPINE - 2-3 VIEW COMPARISON:  Cervical MRI, 07/28/2017 FINDINGS: No fracture, bone lesion or spondylolisthesis. Moderate loss of disc height at C4-C5 and C5-C6. Small endplate spurs noted from C3 through C6. Soft tissues are unremarkable. IMPRESSION: 1. No fracture, bone lesion or spondylolisthesis. 2. Disc degenerative changes most evident at C4-C5 and C5-C6. Stable appearance from the prior cervical MRI. Electronically Signed   By: Lajean Manes M.D.   On: 03/17/2018 09:51   Dg C-arm 1-60 Min  Result Date: 03/17/2018 CLINICAL DATA:  Cervical fusion. EXAM: DG C-ARM 61-120 MIN; CERVICAL SPINE - 2-3 VIEW COMPARISON:  03/17/2018 FINDINGS: Intraoperative images of cervical spine were obtained. Endotracheal tube is present. Anterior plate and screw fixation at C4, C5 and C6.  IMPRESSION: Anterior cervical fusion at C4 through C6. Electronically Signed   By: Markus Daft M.D.   On: 03/17/2018 12:10    Discharge Medications:   Allergies as of 03/18/2018   No Known Allergies     Medication List    STOP taking these medications   acetaminophen 500 MG tablet Commonly known as:  TYLENOL     TAKE these medications   allopurinol 100 MG tablet Commonly known as:  ZYLOPRIM Take 2 tablets (200 mg total) by mouth daily.   atorvastatin 40 MG tablet Commonly known as:  LIPITOR Take 1 tablet (40 mg total) by mouth  daily.   glucose blood test strip Commonly known as:  TRUE METRIX BLOOD GLUCOSE TEST Use as instructed. Check blood glucose levels 2 times per day   lisinopril-hydrochlorothiazide 10-12.5 MG tablet Commonly known as:  ZESTORETIC Take 1 tablet by mouth daily.   metFORMIN 500 MG tablet Commonly known as:  GLUCOPHAGE Take 1 tablet (500 mg total) by mouth 2 (two) times daily with a meal.   methocarbamol 500 MG tablet Commonly known as:  ROBAXIN Take 1 tablet (500 mg total) by mouth every 8 (eight) hours as needed for muscle spasms.   omeprazole 20 MG capsule Commonly known as:  PRILOSEC Take 1 capsule (20 mg total) by mouth daily.   oxyCODONE-acetaminophen 5-325 MG tablet Commonly known as:  PERCOCET Take 1-2 tablets by mouth every 6 (six) hours as needed for severe pain.   TRUE METRIX METER w/Device Kit Use as instructed. Check blood glucose levels 2 times per day   TRUEPLUS LANCETS 28G Misc Use as instructed. Check blood glucose levels 2 times per day       Diagnostic Studies: Dg Chest 2 View  Result Date: 03/10/2018 CLINICAL DATA:  Preoperative evaluation for cervical spine fusion, history diabetes mellitus pneumonia, hypertension, acid reflux EXAM: CHEST - 2 VIEW COMPARISON:  None FINDINGS: Normal heart size, mediastinal contours, and pulmonary vascularity. Lungs clear. No pulmonary infiltrate, pleural effusion or pneumothorax. Scattered degenerative disc disease changes thoracic spine. IMPRESSION: No acute abnormalities. Electronically Signed   By: Lavonia Dana M.D.   On: 03/10/2018 09:29   Dg Cervical Spine 2 Or 3 Views  Result Date: 03/17/2018 CLINICAL DATA:  Cervical fusion. EXAM: DG C-ARM 61-120 MIN; CERVICAL SPINE - 2-3 VIEW COMPARISON:  03/17/2018 FINDINGS: Intraoperative images of cervical spine were obtained. Endotracheal tube is present. Anterior plate and screw fixation at C4, C5 and C6. IMPRESSION: Anterior cervical fusion at C4 through C6. Electronically Signed    By: Markus Daft M.D.   On: 03/17/2018 12:10   Dg Cervical Spine 2 Or 3 Views  Result Date: 03/17/2018 CLINICAL DATA:  Preop for cervical spine surgery. EXAM: CERVICAL SPINE - 2-3 VIEW COMPARISON:  Cervical MRI, 07/28/2017 FINDINGS: No fracture, bone lesion or spondylolisthesis. Moderate loss of disc height at C4-C5 and C5-C6. Small endplate spurs noted from C3 through C6. Soft tissues are unremarkable. IMPRESSION: 1. No fracture, bone lesion or spondylolisthesis. 2. Disc degenerative changes most evident at C4-C5 and C5-C6. Stable appearance from the prior cervical MRI. Electronically Signed   By: Lajean Manes M.D.   On: 03/17/2018 09:51   Dg C-arm 1-60 Min  Result Date: 03/17/2018 CLINICAL DATA:  Cervical fusion. EXAM: DG C-ARM 61-120 MIN; CERVICAL SPINE - 2-3 VIEW COMPARISON:  03/17/2018 FINDINGS: Intraoperative images of cervical spine were obtained. Endotracheal tube is present. Anterior plate and screw fixation at C4, C5 and C6. IMPRESSION: Anterior cervical fusion at  C4 through C6. Electronically Signed   By: Markus Daft M.D.   On: 03/17/2018 12:10    Patient benefited maximally from their hospital stay and there were no complications.     Disposition: Discharge disposition: 01-Home or Self Care      Discharge Instructions    Call MD / Call 911   Complete by:  As directed    If you experience chest pain or shortness of breath, CALL 911 and be transported to the hospital emergency room.  If you develope a fever above 101 F, pus (white drainage) or increased drainage or redness at the wound, or calf pain, call your surgeon's office.   Constipation Prevention   Complete by:  As directed    Drink plenty of fluids.  Prune juice may be helpful.  You may use a stool softener, such as Colace (over the counter) 100 mg twice a day.  Use MiraLax (over the counter) for constipation as needed.   Diet - low sodium heart healthy   Complete by:  As directed    Increase activity slowly as tolerated    Complete by:  As directed      Follow-up Information    [name], MD Follow up in 1 week(s).   Specialty:  Orthopedic Surgery Contact information: [place] [address] [phone]            Signed: Erlinda Hong , Hershal Coria 03/18/2018, 8:09 AM [employer] [phone]

## 2018-03-18 NOTE — Progress Notes (Signed)
Subjective: 1 Day Post-Op Procedure(s) (LRB): CERVICAL FOUR-FIVE, CERVCAL FIVE-SIX ANTERIOR CERVICAL DECOMPRESSION/DISCECTOMY FUSION, ALLOGRAFT PLATE (N/A) Patient reports pain as mild.   Sitting up eating breakfast and without complaint.   Objective: Vital signs in last 24 hours: Temp:  [97.5 F (36.4 C)-98.2 F (36.8 C)] 97.7 F (36.5 C) (02/29 0728) Pulse Rate:  [78-99] 78 (02/29 0728) Resp:  [7-21] 16 (02/29 0728) BP: (123-160)/(76-99) 138/81 (02/29 0728) SpO2:  [88 %-99 %] 99 % (02/29 0728)  Intake/Output from previous day: 02/28 0701 - 02/29 0700 In: 1340 [P.O.:240; I.V.:1100] Out: 140 [Drains:40; Blood:100] Intake/Output this shift: No intake/output data recorded.  No results for input(s): HGB in the last 72 hours. No results for input(s): WBC, RBC, HCT, PLT in the last 72 hours. No results for input(s): NA, K, CL, CO2, BUN, CREATININE, GLUCOSE, CALCIUM in the last 72 hours. No results for input(s): LABPT, INR in the last 72 hours.  Anterior honeycomb dressing in place over operative site after drain removed by RN earlier this am. Incision was clean, dry and intact per RN.    Assessment/Plan: 1 Day Post-Op Procedure(s) (LRB): CERVICAL FOUR-FIVE, CERVCAL FIVE-SIX ANTERIOR CERVICAL DECOMPRESSION/DISCECTOMY FUSION, ALLOGRAFT PLATE (N/A)  Ready for DC home. Prescriptions sent yesterday and has follow up already.    Lazaro Arms, PA-C 03/18/2018, 8:04 AM [facility] [phone]

## 2018-03-18 NOTE — Progress Notes (Signed)
Patient alert and oriented, mae's well, voiding adequate amount of urine, swallowing without difficulty, no c/o pain at time of discharge. Patient discharged home with family. Script and discharged instructions given to patient. Patient and family stated understanding of instructions given. Patient has an appointment with Dr. Yates  

## 2018-03-20 ENCOUNTER — Telehealth (INDEPENDENT_AMBULATORY_CARE_PROVIDER_SITE_OTHER): Payer: Self-pay | Admitting: Radiology

## 2018-03-20 NOTE — Telephone Encounter (Signed)
Patient would like to know if he can sleep in his bed as long as he is elevated and wearing his collar. He tried to sleep in his recliner last night and did not get any sleep. He is not having any pain and has not taken pain medication since yesterday.  Please advise.  CB [phone]

## 2018-03-20 NOTE — Telephone Encounter (Signed)
I called patient and advised. We also discussed wearing the collar for 6 weeks and no driving while wearing the collar. Patient expressed understanding.

## 2018-03-20 NOTE — Telephone Encounter (Signed)
Sure OK thanks.  

## 2018-03-21 ENCOUNTER — Encounter (HOSPITAL_COMMUNITY): Payer: Self-pay | Admitting: Orthopaedic Surgery

## 2018-03-22 ENCOUNTER — Telehealth: Payer: Self-pay | Admitting: Nurse Practitioner

## 2018-03-22 ENCOUNTER — Telehealth (INDEPENDENT_AMBULATORY_CARE_PROVIDER_SITE_OTHER): Payer: Self-pay | Admitting: Orthopaedic Surgery

## 2018-03-22 NOTE — Telephone Encounter (Signed)
Pt called in stating his bp is 136/91 and pulse is reading 133

## 2018-03-22 NOTE — Telephone Encounter (Signed)
Patient called advised his bandage is coming off. Patient is not coming in to see Dr. Ophelia Charter until 03/31/2018. The number to contact patient is [phone]

## 2018-03-22 NOTE — Telephone Encounter (Signed)
Patient called with a concern that his heart rate was elevated.  Patient states he has had a surgery in the past week.  Patient states he has taken his blood pressure that was elevated for him.  Called Orthopedics but states he was put on hold for "40 minutes."  Patient states he has taken his pulse three (3) different times and every time it has been over 130.  Patient advised to go to the ED.

## 2018-03-22 NOTE — Telephone Encounter (Signed)
Noted and agree with advice. 

## 2018-03-22 NOTE — Telephone Encounter (Signed)
Talked with patient and advised him per [facility] to apply a 4x4 gauze over his incision.    Patient was calling concerning his pulse rate being high.  Talked with Tamela Oddi and she advised that patient could be anxious from leaving [employer] from taking his vitals, then going home and taking his vitals again. Per Tamela Oddi, patient should contact his PCP concerning pulse rate.   Advise patient of message above.  Patient understands.

## 2018-03-23 ENCOUNTER — Emergency Department (HOSPITAL_COMMUNITY): Payer: Self-pay

## 2018-03-23 ENCOUNTER — Other Ambulatory Visit: Payer: Self-pay

## 2018-03-23 ENCOUNTER — Emergency Department (HOSPITAL_COMMUNITY)
Admission: EM | Admit: 2018-03-23 | Discharge: 2018-03-23 | Disposition: A | Discharge status: Home / Self Care | Payer: Self-pay | Attending: Emergency Medicine | Admitting: Emergency Medicine

## 2018-03-23 ENCOUNTER — Encounter (HOSPITAL_COMMUNITY): Payer: Self-pay | Admitting: Emergency Medicine

## 2018-03-23 DIAGNOSIS — E119 Type 2 diabetes mellitus without complications: Secondary | ICD-10-CM | POA: Insufficient documentation

## 2018-03-23 DIAGNOSIS — I10 Essential (primary) hypertension: Secondary | ICD-10-CM | POA: Insufficient documentation

## 2018-03-23 DIAGNOSIS — Z7984 Long term (current) use of oral hypoglycemic drugs: Secondary | ICD-10-CM | POA: Insufficient documentation

## 2018-03-23 DIAGNOSIS — R000 Tachycardia, unspecified: Secondary | ICD-10-CM | POA: Insufficient documentation

## 2018-03-23 DIAGNOSIS — Z79899 Other long term (current) drug therapy: Secondary | ICD-10-CM | POA: Insufficient documentation

## 2018-03-23 LAB — BASIC METABOLIC PANEL
Anion gap: 14 (ref 5–15)
BUN: 15 mg/dL (ref 6–20)
CO2: 27 mmol/L (ref 22–32)
Calcium: 9.7 mg/dL (ref 8.9–10.3)
Chloride: 95 mmol/L — ABNORMAL LOW (ref 98–111)
Creatinine, Ser: 0.81 mg/dL (ref 0.61–1.24)
GFR calc Af Amer: >60 mL/min (ref >60)
GFR calc non Af Amer: >60 mL/min (ref >60)
Glucose, Bld: 128 mg/dL — ABNORMAL HIGH (ref 70–99)
Potassium: 3.8 mmol/L (ref 3.5–5.1)
Sodium: 136 mmol/L (ref 135–145)

## 2018-03-23 LAB — CBC WITH DIFFERENTIAL/PLATELET
ABS IMMATURE GRANULOCYTES: 0.02 10*3/uL (ref 0.00–0.07)
Basophils Absolute: 0.1 10*3/uL (ref 0.0–0.1)
Basophils Relative: 1 %
Eosinophils Absolute: 0.1 10*3/uL (ref 0.0–0.5)
Eosinophils Relative: 2 %
HCT: 43.7 % (ref 39.0–52.0)
Hemoglobin: 14.1 g/dL (ref 13.0–17.0)
Immature Granulocytes: 0 %
LYMPHS ABS: 2.3 10*3/uL (ref 0.7–4.0)
Lymphocytes Relative: 28 %
MCH: 28.1 pg (ref 26.0–34.0)
MCHC: 32.3 g/dL (ref 30.0–36.0)
MCV: 87.2 fL (ref 80.0–100.0)
Monocytes Absolute: 0.7 10*3/uL (ref 0.1–1.0)
Monocytes Relative: 9 %
Neutro Abs: 5 10*3/uL (ref 1.7–7.7)
Neutrophils Relative %: 60 %
Platelets: 322 10*3/uL (ref 150–400)
RBC: 5.01 MIL/uL (ref 4.22–5.81)
RDW: 13.4 % (ref 11.5–15.5)
WBC: 8.2 10*3/uL (ref 4.0–10.5)
nRBC: 0 % (ref 0.0–0.2)

## 2018-03-23 LAB — URINALYSIS, ROUTINE W REFLEX MICROSCOPIC
Glucose, UA: NEGATIVE mg/dL
Hgb urine dipstick: NEGATIVE
Ketones, ur: NEGATIVE mg/dL
Leukocytes,Ua: NEGATIVE
Nitrite: NEGATIVE
Protein, ur: NEGATIVE mg/dL
Specific Gravity, Urine: 1.025 (ref 1.005–1.030)
pH: 6 (ref 5.0–8.0)

## 2018-03-23 LAB — LACTIC ACID, PLASMA: Lactic Acid, Venous: 1.1 mmol/L (ref 0.5–1.9)

## 2018-03-23 MED ORDER — SODIUM CHLORIDE 0.9 % IV BOLUS
1000.0000 mL | Freq: Once | INTRAVENOUS | Status: AC
  Administered 2018-03-23: 1000 mL via INTRAVENOUS

## 2018-03-23 MED ORDER — SODIUM CHLORIDE 0.9 % IV BOLUS: 1000.0000 mL | Freq: Once | INTRAVENOUS | Status: DC

## 2018-03-23 MED ORDER — IOHEXOL 350 MG/ML SOLN
75.0000 mL | Freq: Once | INTRAVENOUS | Status: AC | PRN
  Administered 2018-03-23: 75 mL via INTRAVENOUS

## 2018-03-23 NOTE — ED Notes (Signed)
Ambulated pt to the restroom, pts HR at resting was 100. Pt began ambulating, pt's HR elevated at 120. Pt SpO2 stayed at 100%, notified Dr. Charm Barges.

## 2018-03-23 NOTE — ED Notes (Signed)
Back from CT

## 2018-03-23 NOTE — ED Triage Notes (Addendum)
Patient arrived from home reporting high heart rate, foot pain and pain in the neck

## 2018-03-23 NOTE — Discharge Instructions (Addendum)
You were seen in the emergency department for an elevated heart rate.  There was no evidence of any infection or blood clot.  Will be important for you to follow-up with your primary care doctor and your orthopedic specialist.  Please return if any worsening symptoms.

## 2018-03-23 NOTE — ED Provider Notes (Signed)
[facility] EMERGENCY DEPARTMENT Provider Note   CSN: [id number] Arrival date & time: 03/23/18  0730    History   Chief Complaint Chief Complaint  Patient presents with  . Tachycardia    HPI Christopher Hurst. is a 53 y.o. male.  He is a week postop from a cervical fusion.  He is here for evaluation of tachycardia.  Said he checked his pulse yesterday and found it to be in the 130s.  He called EMS yesterday and they came to the house but told him he was fine and did not bring him to the hospital.  His heart rate was still elevated this morning and his PCP recommended that he come to the hospital.  He said he had a little bit of posterior neck pain but not much.  He has been sleeping in a recliner and he said he is not really getting much rest.  He is very hungry but is having some discomfort with swallowing and is afraid he is losing weight.  No vomiting no diarrhea no chest pain no shortness of breath.  He has had a nonproductive cough.  No known fevers.  He has had a little left foot discomfort on the bottom which he attributes to plantar fasciitis.     The history is provided by the patient.  Cough  Cough characteristics:  Non-productive Severity:  Moderate Onset quality:  Gradual Timing:  Intermittent Progression:  Unchanged Chronicity:  New Smoker: no   Relieved by:  None tried Worsened by:  Nothing Ineffective treatments:  None tried Associated symptoms: sore throat   Associated symptoms: no chest pain, no chills, no diaphoresis, no fever, no headaches, no rash, no shortness of breath and no sinus congestion   Risk factors: no recent travel     Past Medical History:  Diagnosis Date  . Acid reflux   . Arthritis   . Diabetes mellitus without complication ([place])   . Gout   . High cholesterol   . Hypertension   . Obesity   . Pneumonia     Patient Active Problem List   Diagnosis Date Noted  . Cervical stenosis of spine 03/17/2018  . Spinal stenosis  of cervical region 08/07/2017  . Arthritis of left hip 07/19/2017  . Other bilateral secondary osteoarthritis of knee 07/19/2017  . Spondylosis of thoracic region without myelopathy or radiculopathy 07/19/2017  . Midline low back pain without sciatica 04/13/2017  . Obesity   . Essential hypertension   . High cholesterol   . Gout     Past Surgical History:  Procedure Laterality Date  . ANTERIOR CERVICAL DECOMP/DISCECTOMY FUSION N/A 03/17/2018   Procedure: CERVICAL FOUR-FIVE, CERVCAL FIVE-SIX ANTERIOR CERVICAL DECOMPRESSION/DISCECTOMY FUSION, ALLOGRAFT PLATE;  Surgeon: [name], MD;  Location: [facility];  Service: Orthopedics;  Laterality: N/A;  . CYSTECTOMY     testicle   . KNEE ARTHROSCOPY     left two knee surgery   . SHOULDER ARTHROSCOPY     one left shoulder   . TONSILLECTOMY          Home Medications    Prior to Admission medications   Medication Sig Start Date End Date Taking? Authorizing Provider  allopurinol (ZYLOPRIM) 100 MG tablet Take 2 tablets (200 mg total) by mouth daily. 03/13/18 06/11/18  Gildardo Pounds, NP  atorvastatin (LIPITOR) 40 MG tablet Take 1 tablet (40 mg total) by mouth daily. 03/13/18   Gildardo Pounds, NP  Blood Glucose Monitoring Suppl (TRUE METRIX  METER) w/Device KIT Use as instructed. Check blood glucose levels 2 times per day 09/09/17   Gildardo Pounds, NP  glucose blood (TRUE METRIX BLOOD GLUCOSE TEST) test strip Use as instructed. Check blood glucose levels 2 times per day 03/13/18   Gildardo Pounds, NP  lisinopril-hydrochlorothiazide (ZESTORETIC) 10-12.5 MG tablet Take 1 tablet by mouth daily. 12/09/17   Gildardo Pounds, NP  metFORMIN (GLUCOPHAGE) 500 MG tablet Take 1 tablet (500 mg total) by mouth 2 (two) times daily with a meal. 03/13/18   Gildardo Pounds, NP  methocarbamol (ROBAXIN) 500 MG tablet Take 1 tablet (500 mg total) by mouth every 8 (eight) hours as needed for muscle spasms. 03/17/18   [name], MD  omeprazole (PRILOSEC) 20 MG  capsule Take 1 capsule (20 mg total) by mouth daily. 03/13/18 06/11/18  Gildardo Pounds, NP  oxyCODONE-acetaminophen (PERCOCET) 5-325 MG tablet Take 1-2 tablets by mouth every 6 (six) hours as needed for severe pain. 03/17/18 03/17/19  [name], MD  TRUEPLUS LANCETS 28G MISC Use as instructed. Check blood glucose levels 2 times per day 03/13/18   Gildardo Pounds, NP    Family History Family History  Problem Relation Age of Onset  . Cirrhosis Father   . Stroke Father   . Hypertension Brother     Social History Social History   Tobacco Use  . Smoking status: Never Smoker  . Smokeless tobacco: Never Used  Substance Use Topics  . Alcohol use: Not Currently  . Drug use: No     Allergies   Patient has no known allergies.   Review of Systems Review of Systems  Constitutional: Negative for chills, diaphoresis and fever.  HENT: Positive for sore throat.   Eyes: Negative for visual disturbance.  Respiratory: Positive for cough. Negative for shortness of breath.   Cardiovascular: Negative for chest pain and palpitations.  Gastrointestinal: Negative for abdominal pain.  Genitourinary: Negative for dysuria.  Musculoskeletal: Positive for neck pain.  Skin: Negative for rash.  Neurological: Negative for headaches.     Physical Exam Updated Vital Signs BP (!) 141/100 (BP Location: Right Arm)   Pulse (!) 125   Temp 98.2 F (36.8 C) (Oral)   Resp 16   SpO2 98%   Physical Exam Vitals signs and nursing note reviewed.  Constitutional:      Appearance: He is well-developed.  HENT:     Head: Normocephalic and atraumatic.  Eyes:     Conjunctiva/sclera: Conjunctivae normal.  Neck:     Comments: Patient is in a soft cervical collar.  Trach midline. Cardiovascular:     Rate and Rhythm: Regular rhythm. Tachycardia present.     Pulses: Normal pulses.     Heart sounds: No murmur.  Pulmonary:     Effort: Pulmonary effort is normal. No respiratory distress.     Breath sounds:  Normal breath sounds.  Abdominal:     Palpations: Abdomen is soft.     Tenderness: There is no abdominal tenderness.  Musculoskeletal: Normal range of motion.        General: Tenderness present.     Right lower leg: No edema.     Left lower leg: No edema.     Comments: Lower extremities no cords or tenderness.  He has some mild tenderness on the plantar surface of his left foot.  No warmth or redness.  Skin:    General: Skin is warm and dry.     Capillary Refill: Capillary refill  takes less than 2 seconds.  Neurological:     General: No focal deficit present.     Mental Status: He is alert and oriented to person, place, and time.     Sensory: No sensory deficit.     Motor: No weakness.      ED Treatments / Results  Labs (all labs ordered are listed, but only abnormal results are displayed) Labs Reviewed  BASIC METABOLIC PANEL - Abnormal; Notable for the following components:      Result Value   Chloride 95 (*)    Glucose, Bld 128 (*)    All other components within normal limits  URINALYSIS, ROUTINE W REFLEX MICROSCOPIC - Abnormal; Notable for the following components:   Bilirubin Urine SMALL (*)    All other components within normal limits  CBC WITH DIFFERENTIAL/PLATELET  LACTIC ACID, PLASMA    EKG EKG Interpretation  Date/Time:  Thursday March 23 2018 07:41:55 EST Ventricular Rate:  111 PR Interval:    QRS Duration: 105 QT Interval:  340 QTC Calculation: 462 R Axis:   18 Text Interpretation:  Sinus tachycardia Probable left ventricular hypertrophy similar to prior except faster, 8/19 Confirmed by Aletta Edouard [phone]) on 03/23/2018 7:51:12 AM Also confirmed by Aletta Edouard [phone]), editor Philomena Doheny [phone])  on 03/23/2018 12:16:22 PM   Radiology Dg Chest 2 View  Result Date: 03/23/2018 CLINICAL DATA:  Cough EXAM: CHEST - 2 VIEW COMPARISON:  03/10/2018 FINDINGS: The heart size and mediastinal contours are within normal limits. Both lungs are clear. The  visualized skeletal structures are unremarkable. IMPRESSION: No active cardiopulmonary disease. Electronically Signed   By: Franchot Gallo M.D.   On: 03/23/2018 08:36   Ct Angio Chest Pe W/cm &/or Wo Cm  Result Date: 03/23/2018 CLINICAL DATA:  Chest pain, tachycardia EXAM: CT ANGIOGRAPHY CHEST WITH CONTRAST TECHNIQUE: Multidetector CT imaging of the chest was performed using the standard protocol during bolus administration of intravenous contrast. Multiplanar CT image reconstructions and MIPs were obtained to evaluate the vascular anatomy. CONTRAST:  27m OMNIPAQUE IOHEXOL 350 MG/ML SOLN COMPARISON:  None. FINDINGS: Cardiovascular: Heart size normal. No pericardial effusion. Satisfactory opacification of pulmonary arteries noted, and there is no evidence of pulmonary emboli. Scattered coronary calcifications. Adequate contrast opacification of the thoracic aorta with no evidence of dissection, aneurysm, or stenosis. There is classic 3-vessel brachiocephalic arch anatomy without proximal stenosis. Minimal calcified aortic plaque. Mediastinum/Nodes: No hilar or mediastinal adenopathy. Lungs/Pleura: No effusion. No pneumothorax. Lungs clear. Upper Abdomen: 5.1 cm probable cyst, upper pole right kidney. No acute findings. Musculoskeletal: Cervical fixation hardware partially visualized. Anterior vertebral endplate spurring at multiple levels in the mid thoracic spine. No fracture or worrisome bone lesion. Review of the MIP images confirms the above findings. IMPRESSION: 1. Negative for acute PE or thoracic aortic dissection. 2. Coronary and Aortic Atherosclerosis (ICD10-170.0). Electronically Signed   By: DLucrezia EuropeM.D.   On: 03/23/2018 11:53    Procedures Procedures (including critical care time)  Medications Ordered in ED Medications - No data to display   Initial Impression / Assessment and Plan / ED Course  I have reviewed the triage vital signs and the nursing notes.  Pertinent labs & imaging  results that were available during my care of the patient were reviewed by me and considered in my medical decision making (see chart for details).  Clinical Course as of Mar 23 1531  Thu Mar 05, [id number] old male here with tachycardia since yesterday.  He is postop  and he has had a little bit of a cough.  No shortness of breath.  No known fever.  He is getting some screening labs and chest x-ray.   [MB]  [id number] Patient underwent a trending pulse ox.  His oxygenation stated 100% although his heart rate went from 100-1 20 with ambulation.   [MB]  4643 Patient's PE study is negative.  Will discharge and have him follow-up with his PCP and surgeon.   [MB]  1427 I did evaluate his wound and there is no erythema or drainage.   [MB]    Clinical Course User Index [MB] Hayden Rasmussen, MD       Final Clinical Impressions(s) / ED Diagnoses   Final diagnoses:  Tachycardia    ED Discharge Orders    None       Hayden Rasmussen, MD 03/23/18 1534

## 2018-03-23 NOTE — ED Notes (Signed)
Patient verbalizes understanding of discharge instructions. Opportunity for questioning and answers were provided. Armband removed by staff, pt discharged from ED.  

## 2018-03-24 ENCOUNTER — Telehealth (INDEPENDENT_AMBULATORY_CARE_PROVIDER_SITE_OTHER): Payer: Self-pay | Admitting: Orthopaedic Surgery

## 2018-03-24 NOTE — Telephone Encounter (Signed)
Patient called asked if he should continue wearing the ted hose until his appointment with Dr Ophelia Charter. The number  To contact patient is [phone]

## 2018-03-24 NOTE — Telephone Encounter (Signed)
Patient states that he went to the hospital yesterday and they did a work up and he was doing well except for being dehydrated.  They gave him some IV fluids.  He is feeling much better. He will continue to wear the TEDS until his follow up visit in the office.

## 2018-03-24 NOTE — Telephone Encounter (Signed)
Please advise. Patient also just seen in the hospital for tachycardia.

## 2018-03-24 NOTE — Telephone Encounter (Signed)
Yes sure, it will help with leg and foot swelling thanks

## 2018-03-28 ENCOUNTER — Ambulatory Visit (INDEPENDENT_AMBULATORY_CARE_PROVIDER_SITE_OTHER): Payer: Self-pay

## 2018-03-28 ENCOUNTER — Encounter (INDEPENDENT_AMBULATORY_CARE_PROVIDER_SITE_OTHER): Payer: Self-pay | Admitting: Orthopaedic Surgery

## 2018-03-28 ENCOUNTER — Ambulatory Visit (INDEPENDENT_AMBULATORY_CARE_PROVIDER_SITE_OTHER): Payer: Self-pay | Admitting: Orthopaedic Surgery

## 2018-03-28 VITALS — BP 147/87 | HR 90

## 2018-03-28 DIAGNOSIS — Z981 Arthrodesis status: Secondary | ICD-10-CM

## 2018-03-28 NOTE — Progress Notes (Signed)
   Office Visit Note   Patient: Christopher Hurst.           Date of Birth: [birth year]           MRN: [id number] Visit Date: 03/28/2018              Requested by: Claiborne Rigg, NP [address] [place], [place] [address] PCP: Claiborne Rigg, NP   Assessment & Plan: Visit Diagnoses:  1. S/P cervical spinal fusion     Plan: Post two-level cervical fusion is off his pain medication good relief of preop pain.  X-rays look good follow-up 5 weeks for lateral flexion-extension x-rays on return.  Follow-Up Instructions: No follow-ups on file.   Orders:  Orders Placed This Encounter  Procedures  . XR Cervical Spine 2 or 3 views   No orders of the defined types were placed in this encounter.     Procedures: No procedures performed   Clinical Data: No additional findings.   Subjective: Chief Complaint  Patient presents with  . Neck - Routine Post Op    HPI follow-up 2 level cervical fusion postop.  Review of Systems unchanged from surgery.   Objective: Vital Signs: BP (!) 147/87   Pulse 90   Physical Exam neck incision looks good.  Ortho Exam good relief of preop arm weakness  Specialty Comments:  No specialty comments available.  Imaging: No results found.   PMFS History: Patient Active Problem List   Diagnosis Date Noted  . Cervical stenosis of spine 03/17/2018  . Spinal stenosis of cervical region 08/07/2017  . Arthritis of left hip 07/19/2017  . Other bilateral secondary osteoarthritis of knee 07/19/2017  . Spondylosis of thoracic region without myelopathy or radiculopathy 07/19/2017  . Midline low back pain without sciatica 04/13/2017  . Obesity   . Essential hypertension   . High cholesterol   . Gout    Past Medical History:  Diagnosis Date  . Acid reflux   . Arthritis   . Diabetes mellitus without complication ([facility])   . Gout   . High cholesterol   . Hypertension   . Obesity   . Pneumonia     Family History  Problem Relation  Age of Onset  . Cirrhosis Father   . Stroke Father   . Hypertension Brother     Past Surgical History:  Procedure Laterality Date  . ANTERIOR CERVICAL DECOMP/DISCECTOMY FUSION N/A 03/17/2018   Procedure: CERVICAL FOUR-FIVE, CERVCAL FIVE-SIX ANTERIOR CERVICAL DECOMPRESSION/DISCECTOMY FUSION, ALLOGRAFT PLATE;  Surgeon: Eldred Manges, MD;  Location: [facility];  Service: Orthopedics;  Laterality: N/A;  . CYSTECTOMY     testicle   . KNEE ARTHROSCOPY     left two knee surgery   . SHOULDER ARTHROSCOPY     one left shoulder   . TONSILLECTOMY     Social History   Occupational History  . Not on file  Tobacco Use  . Smoking status: Never Smoker  . Smokeless tobacco: Never Used  Substance and Sexual Activity  . Alcohol use: Not Currently  . Drug use: No  . Sexual activity: Yes

## 2018-03-31 ENCOUNTER — Inpatient Hospital Stay (INDEPENDENT_AMBULATORY_CARE_PROVIDER_SITE_OTHER): Payer: Self-pay | Admitting: Orthopaedic Surgery

## 2018-04-19 MED FILL — metFORMIN HCL 500 MG TABS: 500 | 30 days supply | Qty: 60 | Fill #0

## 2018-04-19 MED FILL — TRUEplus LANCETS 28G MISC: 50 days supply | Qty: 100 | Fill #0

## 2018-04-19 MED FILL — ?ATORVASTATIN 40MG TABLET: 40 | 90 days supply | Qty: 90 | Fill #0

## 2018-04-19 MED FILL — LISINOPRIL-HCTZ 10-12.5 MG: 10-12.5 | 30 days supply | Qty: 30 | Fill #4

## 2018-04-20 ENCOUNTER — Encounter: Payer: Self-pay | Admitting: Family Medicine

## 2018-04-20 ENCOUNTER — Ambulatory Visit: Payer: Self-pay | Attending: Family Medicine | Admitting: Family Medicine

## 2018-04-20 ENCOUNTER — Other Ambulatory Visit: Payer: Self-pay

## 2018-04-20 DIAGNOSIS — M1A9XX0 Chronic gout, unspecified, without tophus (tophi): Secondary | ICD-10-CM

## 2018-04-20 DIAGNOSIS — M79672 Pain in left foot: Secondary | ICD-10-CM

## 2018-04-20 DIAGNOSIS — M1A09X0 Idiopathic chronic gout, multiple sites, without tophus (tophi): Secondary | ICD-10-CM

## 2018-04-20 DIAGNOSIS — R000 Tachycardia, unspecified: Secondary | ICD-10-CM

## 2018-04-20 MED ORDER — ALLOPURINOL 100 MG PO TABS: 200.0000 mg | ORAL_TABLET | Freq: Every day | ORAL | 0 refills | Status: DC

## 2018-04-20 MED ORDER — ALLOPURINOL 100 MG PO TABS: 200.0000 mg | ORAL_TABLET | Freq: Every day | ORAL | 1 refills | Status: DC

## 2018-04-20 NOTE — Progress Notes (Signed)
Patient verified DOB Patient has taken medication today. Patient has eaten today. Patient is receiving allopurinol via mail. Patient had a fusion on 3/28.  Patient complains of left foot pain, that he does not believe its related to the plantar. Patient receives relief when he has a shoe or slipper on. Pain radiates up to the toes. Pain occurs rest and releases once moving. Patient states a similar episode happened a couple weeks ago. It went away for a few days and then returned yesterday. Pain is tender to touch. Hurts to tighten the shoe lace. Patient reports morning sugar level at 77.

## 2018-04-23 ENCOUNTER — Encounter: Payer: Self-pay | Admitting: Family Medicine

## 2018-04-23 NOTE — Progress Notes (Signed)
Virtual Visit via Telephone Note  I connected with Christopher Hurst. on 04/23/18 at 2:38 PM EST by telephone and verified that I am speaking with the correct person using two identifiers.  Due to current restrictions/limitations of-office visits due to the COVID-19 pandemic, this scheduled clinical appointment was converted to a telehealth visit   I discussed the limitations, risks, security and privacy concerns of performing an evaluation and management service by telephone and the availability of in person appointments. I also discussed with the patient that there may be a patient responsible charge related to this service. The patient expressed understanding and agreed to proceed.     History of Present Illness:      53 year old male who states that he underwent a recent cervical spine fusion surgery by Dr. Rodell Perna and a day or 2 after the surgery, patient started to experience tachycardia with a heart rate between 140-150.  Patient was seen in the emergency department on 03/23/2018 and patient states that he received fluids despite being told that he did not appear to be dehydrated however after receiving the fluids, patient reports that his heart rate decreased and he felt much better.  Patient reports that his home heart rate is now in the 80s.  Patient states that he no longer requires the use of any medications for the treatment of neck pain related to his recent surgery.  Patient however states that 1 of the things that he has been experiencing since his surgery is sharp pain in the bottom of his left foot.  Patient states that he mentioned this in the emergency department but states that he was told that their main concern was his heart rate.  Patient states that the pain feels slightly similar to pain he has experienced in the past with issues related to plantar fasciitis.  Patient does also have a history of gout but has not felt as if his foot was warm or swollen.  Patient states that in  the past with plantar fasciitis he was told to roll his left foot over a frozen bottle of water for 20 minutes several times per day and patient states that in the past the pain would always go away however this time the pain continues to persist.  Upon questioning, patient agrees that the pain is worse when he first puts his feet on the floor in the morning and then gradually lessens as he is more mobile.  Patient states that the pain is about a 5 on a 0-to-10 scale otherwise but is close to a 10 when he first puts his feet on the floor in the morning.  Pain is sharp in nature.   Patient Active Problem List   Diagnosis Date Noted  . Cervical stenosis of spine 03/17/2018  . Spinal stenosis of cervical region 08/07/2017  . Arthritis of left hip 07/19/2017  . Other bilateral secondary osteoarthritis of knee 07/19/2017  . Spondylosis of thoracic region without myelopathy or radiculopathy 07/19/2017  . Midline low back pain without sciatica 04/13/2017  . Obesity   . Essential hypertension   . High cholesterol   . Gout      Current Outpatient Medications on File Prior to Visit  Medication Sig Dispense Refill  . atorvastatin (LIPITOR) 40 MG tablet Take 1 tablet (40 mg total) by mouth daily. 90 tablet 3  . Blood Glucose Monitoring Suppl (TRUE METRIX METER) w/Device KIT Use as instructed. Check blood glucose levels 2 times per day 1 kit 0  .  glucose blood (TRUE METRIX BLOOD GLUCOSE TEST) test strip Use as instructed. Check blood glucose levels 2 times per day 100 each 11  . lisinopril-hydrochlorothiazide (ZESTORETIC) 10-12.5 MG tablet Take 1 tablet by mouth daily. 90 tablet 3  . metFORMIN (GLUCOPHAGE) 500 MG tablet Take 1 tablet (500 mg total) by mouth 2 (two) times daily with a meal. 180 tablet 3  . methocarbamol (ROBAXIN) 500 MG tablet Take 1 tablet (500 mg total) by mouth every 8 (eight) hours as needed for muscle spasms. 40 tablet 0  . omeprazole (PRILOSEC) 20 MG capsule Take 1 capsule (20 mg  total) by mouth daily. 90 capsule 2  . oxyCODONE-acetaminophen (PERCOCET) 5-325 MG tablet Take 1-2 tablets by mouth every 6 (six) hours as needed for severe pain. 40 tablet 0  . promethazine (PHENERGAN) 25 MG tablet TK 1 T PO  Q 6 H PRN FOR NAUSEA    . TRUEPLUS LANCETS 28G MISC Use as instructed. Check blood glucose levels 2 times per day 100 each 3   No current facility-administered medications on file prior to visit.     No Known Allergies  Social History   Tobacco Use  . Smoking status: Never Smoker  . Smokeless tobacco: Never Used  Substance Use Topics  . Alcohol use: Not Currently  . Drug use: No     Family History  Problem Relation Age of Onset  . Cirrhosis Father   . Stroke Father   . Hypertension Brother     Past Surgical History:  Procedure Laterality Date  . ANTERIOR CERVICAL DECOMP/DISCECTOMY FUSION N/A 03/17/2018   Procedure: CERVICAL FOUR-FIVE, CERVCAL FIVE-SIX ANTERIOR CERVICAL DECOMPRESSION/DISCECTOMY FUSION, ALLOGRAFT PLATE;  Surgeon: [name], MD;  Location: [facility];  Service: Orthopedics;  Laterality: N/A;  . CYSTECTOMY     testicle   . KNEE ARTHROSCOPY     left two knee surgery   . SHOULDER ARTHROSCOPY     one left shoulder   . TONSILLECTOMY      ROS: Review of Systems  Constitutional: Positive for malaise/fatigue. Negative for chills and fever.  HENT: Negative for congestion and sore throat.   Eyes: Negative for blurred vision and double vision.  Respiratory: Negative for cough and shortness of breath.   Cardiovascular: Negative for chest pain and palpitations.  Gastrointestinal: Negative for abdominal pain, constipation, diarrhea and nausea.  Genitourinary: Negative for dysuria and frequency.  Musculoskeletal: Positive for back pain, joint pain (Left foot pain) and neck pain (Minimal status post surgery).  Skin: Negative for itching and rash.  Neurological: Negative for dizziness and headaches.  Endo/Heme/Allergies: Negative for polydipsia.  Does not bruise/bleed easily.   Objective: No vital signs obtained and no physical examination as encounter was done by telephone  ASSESSMENT AND PLAN: 1. Acute foot pain, left Patient with complaint of acute left foot pain which is somewhat similar to past issues that patient has had with plantar fasciitis.  Patient however with history of gout but denies any current issues with acute swelling, redness or increased warmth in his foot which would normally occur with a gout flareup.  Discussed with patient that he should continue prior exercises/treatment for plantar fasciitis such as stretching the arch of his foot by using a frozen water bottle or canned good.  Initially offered prescription for Voltaren gel which patient declines at this time.  Patient will be referred to orthopedics or podiatry for further evaluation and treatment of his foot pain.  Patient's pain also does not appear to be  radicular in nature based on his description of symptoms.  2. Tachycardia Patient's notes and labs from his emergency department visit on 03/23/2018 reviewed and discussed with the patient.  Patient did not have an elevated BUN or creatinine consistent with dehydration however he likely did have some element of dehydration as he did have improvement in tachycardia and felt better after receiving IV fluids.  Patient reports that his tachycardia has now resolved and his heart rate is staying in the 80s or less  3. Chronic gout without tophus, unspecified cause, unspecified site Patient with a history of gout and reports that he is having some left foot pain but this does not sound as if it is solely related to his gout and is more likely related to have plantar fasciitis.  Patient is provided with refill of allopurinol.  Patient had a uric acid level done on 03/13/2018 with uric acid level of 4.6 which also makes gout less likely because of his foot pain as his uric acid levels have been controlled. - allopurinol  (ZYLOPRIM) 100 MG tablet; Take 2 tablets (200 mg total) by mouth daily.  Dispense: 180 tablet; Refill: 1   Future Appointments  Date Time Provider [place]  05/02/2018 10:00 AM [name], MD [facility] None  05/23/2018  3:10 PM Gildardo Pounds, NP [facility] None    Follow Up Instructions:    I discussed the assessment and treatment plan with the patient. The patient was provided an opportunity to ask questions and all were answered. The patient agreed with the plan and demonstrated an understanding of the instructions.   The patient was advised to call back or seek an in-person evaluation if the symptoms worsen or if the condition fails to improve as anticipated.  I provided 15  minutes of non-face-to-face time during this encounter.   Antony Blackbird, MD

## 2018-04-24 MED FILL — TRUE METRIX TEST STRIP: 25 days supply | Qty: 50 | Fill #3

## 2018-04-24 MED FILL — ALLOPURINOL 100 MG TABLET: 100 | 90 days supply | Qty: 180 | Fill #0

## 2018-04-25 ENCOUNTER — Telehealth: Payer: Self-pay | Admitting: Nurse Practitioner

## 2018-04-25 NOTE — Telephone Encounter (Signed)
Patient came in to drop off handicap app. Will be dropped ff in pcp box.

## 2018-04-25 NOTE — Telephone Encounter (Signed)
Will route to PCP. Will contact patient when the form is ready.

## 2018-04-26 NOTE — Telephone Encounter (Signed)
Form is ready.

## 2018-04-27 NOTE — Telephone Encounter (Signed)
CMA spoke to patient and informed his form is ready for pick up.  Pt. Request to have the form mail to him, CMA mailed the form and made a copy also.

## 2018-04-30 ENCOUNTER — Encounter (HOSPITAL_COMMUNITY): Payer: Self-pay

## 2018-04-30 ENCOUNTER — Emergency Department (HOSPITAL_COMMUNITY)
Admission: EM | Admit: 2018-04-30 | Discharge: 2018-05-01 | Disposition: A | Discharge status: Home / Self Care | Payer: Medicaid Other | Attending: Emergency Medicine | Admitting: Emergency Medicine

## 2018-04-30 DIAGNOSIS — I10 Essential (primary) hypertension: Secondary | ICD-10-CM | POA: Insufficient documentation

## 2018-04-30 DIAGNOSIS — Z7984 Long term (current) use of oral hypoglycemic drugs: Secondary | ICD-10-CM | POA: Insufficient documentation

## 2018-04-30 DIAGNOSIS — E119 Type 2 diabetes mellitus without complications: Secondary | ICD-10-CM | POA: Insufficient documentation

## 2018-04-30 DIAGNOSIS — Z79899 Other long term (current) drug therapy: Secondary | ICD-10-CM | POA: Insufficient documentation

## 2018-04-30 NOTE — ED Triage Notes (Signed)
Pt states that he checked his BP today and it was high at 160 systolic, pt denies headache or dizziness.

## 2018-05-01 ENCOUNTER — Telehealth (INDEPENDENT_AMBULATORY_CARE_PROVIDER_SITE_OTHER): Payer: Self-pay | Admitting: Radiology

## 2018-05-01 NOTE — ED Provider Notes (Signed)
[facility] EMERGENCY DEPARTMENT Provider Note   CSN: [id number] Arrival date & time: 04/30/18  2257    History   Chief Complaint Chief Complaint  Patient presents with  . Hypertension    HPI Christopher Hurst. is a 53 y.o. male.     Patient with a history of HTN, HLD DM presents with concern for elevated blood pressure. He states he has taken it multiple times today, both at home and at the local fire station, and found it to be elevated with a max reported pressure of 160/109. No chest pain, SOB, lightheadedness, diaphoresis, nausea, headache, visual changes. He has been taking his blood pressure medications as prescribed.   The history is provided by the patient. No language interpreter was used.  Hypertension     Past Medical History:  Diagnosis Date  . Acid reflux   . Arthritis   . Diabetes mellitus without complication ([facility])   . Gout   . High cholesterol   . Hypertension   . Obesity   . Pneumonia     Patient Active Problem List   Diagnosis Date Noted  . Cervical stenosis of spine 03/17/2018  . Spinal stenosis of cervical region 08/07/2017  . Arthritis of left hip 07/19/2017  . Other bilateral secondary osteoarthritis of knee 07/19/2017  . Spondylosis of thoracic region without myelopathy or radiculopathy 07/19/2017  . Midline low back pain without sciatica 04/13/2017  . Obesity   . Essential hypertension   . High cholesterol   . Gout     Past Surgical History:  Procedure Laterality Date  . ANTERIOR CERVICAL DECOMP/DISCECTOMY FUSION N/A 03/17/2018   Procedure: CERVICAL FOUR-FIVE, CERVCAL FIVE-SIX ANTERIOR CERVICAL DECOMPRESSION/DISCECTOMY FUSION, ALLOGRAFT PLATE;  Surgeon: [name], MD;  Location: [facility];  Service: Orthopedics;  Laterality: N/A;  . CYSTECTOMY     testicle   . KNEE ARTHROSCOPY     left two knee surgery   . SHOULDER ARTHROSCOPY     one left shoulder   . TONSILLECTOMY          Home Medications    Prior to  Admission medications   Medication Sig Start Date End Date Taking? Authorizing Provider  allopurinol (ZYLOPRIM) 100 MG tablet Take 2 tablets (200 mg total) by mouth daily. 04/20/18 07/19/18  Fulp, Cammie, MD  atorvastatin (LIPITOR) 40 MG tablet Take 1 tablet (40 mg total) by mouth daily. 03/13/18   Gildardo Pounds, NP  Blood Glucose Monitoring Suppl (TRUE METRIX METER) w/Device KIT Use as instructed. Check blood glucose levels 2 times per day 09/09/17   Gildardo Pounds, NP  glucose blood (TRUE METRIX BLOOD GLUCOSE TEST) test strip Use as instructed. Check blood glucose levels 2 times per day 03/13/18   Gildardo Pounds, NP  lisinopril-hydrochlorothiazide (ZESTORETIC) 10-12.5 MG tablet Take 1 tablet by mouth daily. 12/09/17   Gildardo Pounds, NP  metFORMIN (GLUCOPHAGE) 500 MG tablet Take 1 tablet (500 mg total) by mouth 2 (two) times daily with a meal. 03/13/18   Gildardo Pounds, NP  methocarbamol (ROBAXIN) 500 MG tablet Take 1 tablet (500 mg total) by mouth every 8 (eight) hours as needed for muscle spasms. 03/17/18   [name], MD  omeprazole (PRILOSEC) 20 MG capsule Take 1 capsule (20 mg total) by mouth daily. 03/13/18 06/11/18  Gildardo Pounds, NP  oxyCODONE-acetaminophen (PERCOCET) 5-325 MG tablet Take 1-2 tablets by mouth every 6 (six) hours as needed for severe pain. 03/17/18 03/17/19  Rodell Perna  C, MD  promethazine (PHENERGAN) 25 MG tablet TK 1 T PO  Q 6 H PRN FOR NAUSEA 03/19/18   [provider]  TRUEPLUS LANCETS 28G MISC Use as instructed. Check blood glucose levels 2 times per day 03/13/18   Gildardo Pounds, NP    Family History Family History  Problem Relation Age of Onset  . Cirrhosis Father   . Stroke Father   . Hypertension Brother     Social History Social History   Tobacco Use  . Smoking status: Never Smoker  . Smokeless tobacco: Never Used  Substance Use Topics  . Alcohol use: Not Currently  . Drug use: No     Allergies   Patient has no known allergies.    Review of Systems Review of Systems  Constitutional: Negative for chills and fever.  HENT: Negative.   Respiratory: Negative.   Cardiovascular: Negative.   Gastrointestinal: Negative.   Musculoskeletal: Negative.   Skin: Negative.   Neurological: Negative.      Physical Exam Updated Vital Signs BP 130/86   Pulse 95   Temp 98.6 F (37 C) (Oral)   Resp 17   SpO2 98%   Physical Exam Constitutional:      Appearance: He is well-developed.  HENT:     Head: Normocephalic.  Neck:     Musculoskeletal: Normal range of motion and neck supple.  Cardiovascular:     Rate and Rhythm: Regular rhythm. Tachycardia present.     Heart sounds: No murmur.  Pulmonary:     Effort: Pulmonary effort is normal.     Breath sounds: Normal breath sounds. No wheezing, rhonchi or rales.  Abdominal:     General: Bowel sounds are normal.     Palpations: Abdomen is soft.     Tenderness: There is no abdominal tenderness. There is no guarding or rebound.  Musculoskeletal: Normal range of motion.     Right lower leg: No edema.     Left lower leg: No edema.  Skin:    General: Skin is warm and dry.     Findings: No rash.  Neurological:     General: No focal deficit present.     Mental Status: He is alert and oriented to person, place, and time.      ED Treatments / Results  Labs (all labs ordered are listed, but only abnormal results are displayed) Labs Reviewed - No data to display  EKG None  Radiology No results found.  Procedures Procedures (including critical care time)  Medications Ordered in ED Medications - No data to display   Initial Impression / Assessment and Plan / ED Course  I have reviewed the triage vital signs and the nursing notes.  Pertinent labs & imaging results that were available during my care of the patient were reviewed by me and considered in my medical decision making (see chart for details).        Patient to ED with concern for elevated blood  pressure. He denies any symptoms and is feeling well. No recent changes to medications, no missed doses.   Blood pressure here was elevated initially and has normalized on multiple rechecks. He is having no symptoms. Feel he can be reassured and discharged home. He is mildly tachycardic, 103 on recheck. Do not find this concerning in the setting of asymptomatic patient's worry/anxiety over pressure readings today.   Recommend follow up with his PCP. Also recommended checking blood pressure at home only twice daily and keeping a log  of measurements to discuss with PCP.  Final Clinical Impressions(s) / ED Diagnoses   Final diagnoses:  None   1. HTN  ED Discharge Orders    None       Charlann Lange, Hershal Coria 05/01/18 0043    Ripley Fraise, MD 05/01/18 [phone]

## 2018-05-01 NOTE — Telephone Encounter (Signed)
Called and left voicemail asking patient to call us back to answer pre screening questions for 4/14 apt. Please ask if patient calls back

## 2018-05-01 NOTE — Discharge Instructions (Addendum)
Take your blood pressure at home twice daily and keep a log to discuss with your primary care provider.

## 2018-05-02 ENCOUNTER — Encounter (INDEPENDENT_AMBULATORY_CARE_PROVIDER_SITE_OTHER): Payer: Self-pay | Admitting: Orthopaedic Surgery

## 2018-05-02 ENCOUNTER — Ambulatory Visit (INDEPENDENT_AMBULATORY_CARE_PROVIDER_SITE_OTHER): Payer: Self-pay | Admitting: Orthopaedic Surgery

## 2018-05-02 ENCOUNTER — Other Ambulatory Visit: Payer: Self-pay

## 2018-05-02 ENCOUNTER — Ambulatory Visit (INDEPENDENT_AMBULATORY_CARE_PROVIDER_SITE_OTHER): Payer: Self-pay

## 2018-05-02 ENCOUNTER — Telehealth (INDEPENDENT_AMBULATORY_CARE_PROVIDER_SITE_OTHER): Payer: Self-pay | Admitting: Radiology

## 2018-05-02 VITALS — Ht 68.0 in | Wt 227.0 lb

## 2018-05-02 DIAGNOSIS — Z981 Arthrodesis status: Secondary | ICD-10-CM

## 2018-05-02 DIAGNOSIS — M1612 Unilateral primary osteoarthritis, left hip: Secondary | ICD-10-CM

## 2018-05-02 NOTE — Telephone Encounter (Signed)
Answered no to all screening questions. 

## 2018-05-02 NOTE — Progress Notes (Signed)
Office Visit Note   Patient: Christopher JacksRichard Geiselman Jr.           Date of Birth: 11-May-1965           MRN: [id number] Visit Date: 05/02/2018              Requested by: Claiborne RiggFleming, Zelda W, NP [address] [place], [place] [id number] PCP: Claiborne RiggFleming, Zelda W, NP   Assessment & Plan: Visit Diagnoses:  1. S/P cervical spinal fusion   2. Unilateral primary osteoarthritis, left hip     Plan: Cervical fusion is solid he can discontinue the collar return in 6 weeks we will obtain standing AP pelvis and frog-leg left hip on return.  Follow-Up Instructions: Return in about 6 weeks (around 06/13/2018).   Orders:  Orders Placed This Encounter  Procedures  . XR Cervical Spine 2 or 3 views   No orders of the defined types were placed in this encounter.     Procedures: No procedures performed   Clinical Data: No additional findings.   Subjective: Chief Complaint  Patient presents with  . Neck - Follow-up    03/17/2018 C4-5, C5-6 ACDF, Allograft, Plate    HPI follow-up cervical fusion he denies any neck pain is had pain in his left foot.  Somehow his allopurinol was not renewed he was off it for a while and then restarted it and now his foot is bothering him worse.  I discussed him he needs to take an anti-inflammatory for a few weeks since allopurinol can exacerbate his gout symptoms.  Said some stiffness in his back and walks with a bent forward position.  Neck is been feeling good neck incision is well-healed.  Today flexion-extension x-rays are obtained.  Recently went to emergency room he had high blood pressure panic attack he was worried about coronavirus in the community but did not have any symptoms.  Review of Systems updated unchanged.  Of note is left hip osteoarthritis present since 2016 with left hip limp.   Objective: Vital Signs: Ht 5\' 8"  (1.727 m)   Wt 227 lb (103 kg)   BMI 34.52 kg/m   Physical Exam Constitutional:      Appearance: He is well-developed.  HENT:   Head: Normocephalic and atraumatic.  Eyes:     Pupils: Pupils are equal, round, and reactive to light.  Neck:     Thyroid: No thyromegaly.     Trachea: No tracheal deviation.  Cardiovascular:     Rate and Rhythm: Normal rate.  Pulmonary:     Effort: Pulmonary effort is normal.     Breath sounds: No wheezing.  Abdominal:     General: Bowel sounds are normal.     Palpations: Abdomen is soft.  Skin:    General: Skin is warm and dry.     Capillary Refill: Capillary refill takes less than 2 seconds.  Neurological:     Mental Status: He is alert and oriented to person, place, and time.  Psychiatric:        Behavior: Behavior normal.        Thought Content: Thought content normal.        Judgment: Judgment normal.     Ortho Exam only 20 degrees external and 10 degrees internal rotation of the left hip he can get a sock on.  Cannot figure 4.  Pain reproduced with range of motion of the hip no hip flexion contracture.  Distal pulses are intact.  No plantar foot lesions.  First  MTP joint shows some slight decreased range of motion but no pain or synovitis.  Tenderness around the heel subtalar joint without erythema no ulcerations.  Distal pulses are 2+.  Specialty Comments:  No specialty comments available.  Imaging: Xr Cervical Spine 2 Or 3 Views  Result Date: 05/02/2018 AP and lateral flexion-extension cervical spine x-rays are obtained and reviewed.  This shows two-level cervical fusion C4-5 C5-6 with graft incorporation and no motion. Impression: Solid two-level fusion with progressive incorporation.  No motion is noted.    PMFS History: Patient Active Problem List   Diagnosis Date Noted  . Unilateral primary osteoarthritis, left hip 05/02/2018  . S/P cervical spinal fusion 05/02/2018  . Cervical stenosis of spine 03/17/2018  . Spinal stenosis of cervical region 08/07/2017  . Arthritis of left hip 07/19/2017  . Other bilateral secondary osteoarthritis of knee 07/19/2017  .  Spondylosis of thoracic region without myelopathy or radiculopathy 07/19/2017  . Midline low back pain without sciatica 04/13/2017  . Obesity   . Essential hypertension   . High cholesterol   . Gout    Past Medical History:  Diagnosis Date  . Acid reflux   . Arthritis   . Diabetes mellitus without complication ([facility])   . Gout   . High cholesterol   . Hypertension   . Obesity   . Pneumonia     Family History  Problem Relation Age of Onset  . Cirrhosis Father   . Stroke Father   . Hypertension Brother     Past Surgical History:  Procedure Laterality Date  . ANTERIOR CERVICAL DECOMP/DISCECTOMY FUSION N/A 03/17/2018   Procedure: CERVICAL FOUR-FIVE, CERVCAL FIVE-SIX ANTERIOR CERVICAL DECOMPRESSION/DISCECTOMY FUSION, ALLOGRAFT PLATE;  Surgeon: Eldred Manges, MD;  Location: [facility];  Service: Orthopedics;  Laterality: N/A;  . CYSTECTOMY     testicle   . KNEE ARTHROSCOPY     left two knee surgery   . SHOULDER ARTHROSCOPY     one left shoulder   . TONSILLECTOMY     Social History   Occupational History  . Not on file  Tobacco Use  . Smoking status: Never Smoker  . Smokeless tobacco: Never Used  Substance and Sexual Activity  . Alcohol use: Not Currently  . Drug use: No  . Sexual activity: Yes

## 2018-05-08 NOTE — Telephone Encounter (Signed)
Will route to PCP 

## 2018-05-08 NOTE — Telephone Encounter (Signed)
Pt called in stated he had a stuffy nose and a slight tickle on his throat wanted to know if he could take something over the counter stated he had diabetes so would like to know of something that wouldn't affect it and would help clear it out

## 2018-05-23 ENCOUNTER — Encounter: Payer: Self-pay | Admitting: Nurse Practitioner

## 2018-05-23 ENCOUNTER — Other Ambulatory Visit: Payer: Self-pay

## 2018-05-23 ENCOUNTER — Ambulatory Visit: Payer: Self-pay | Attending: Nurse Practitioner | Admitting: Nurse Practitioner

## 2018-05-23 DIAGNOSIS — I10 Essential (primary) hypertension: Secondary | ICD-10-CM

## 2018-05-23 DIAGNOSIS — M79672 Pain in left foot: Secondary | ICD-10-CM

## 2018-05-23 NOTE — Progress Notes (Signed)
Virtual Visit via Telephone Note Due to national recommendations of social distancing due to COVID 19, telehealth visit is felt to be most appropriate for this patient at this time.  I discussed the limitations, risks, security and privacy concerns of performing an evaluation and management service by telephone and the availability of in person appointments. I also discussed with the patient that there may be a patient responsible charge related to this service. The patient expressed understanding and agreed to proceed.    I connected with Christopher Jacksichard Mercado Jr. on 05/23/18  at   3:10 PM EDT  EDT by telephone and verified that I am speaking with the correct person using two identifiers.   Consent I discussed the limitations, risks, security and privacy concerns of performing an evaluation and management service by telephone and the availability of in person appointments. I also discussed with the patient that there may be a patient responsible charge related to this service. The patient expressed understanding and agreed to proceed.   Location of Patient: Private Residence   [facility]    Persons participating in Telemedicine visit: Bertram DenverZelda  FNP-BC YY [place] Supply[place]ien CMA Julious Elpidio GaleaMorse Jr.    History of Present Illness: Telemedicine visit for: Hospital Follow for HTN  Essential Hypertension Was seen in the ED on 04-30-2018 for elevated Blood Pressure. At that time he stated he had taken it multiple times that day, both at home and at the local fire station, and found it to be elevated with a max reported pressure of 160/109. He denied chest pain, SOB, lightheadedness, diaphoresis, nausea, headache, visual changes. Blood pressure was elevated initially in the ED and normalized on multiple rechecks. He was dc'd in stable condition. Since then he has been monitoring his blood pressure twice a day. Most recent reading today 127/82. He denies chest  pain, shortness of breath, palpitations, lightheadedness, dizziness, headaches or BLE edema.  BP Readings from Last 3 Encounters:  05/01/18 131/90  03/28/18 (!) 147/87  03/23/18 130/80     Left foot pain He endorses left foot pain since last month. He does have a history of Gout and endorses compliance taking allopurinol. States the pain in his left foot is worse at night. He also has a history of plantar fasciitis but states there is no pain in his heel or midfoot. The pain is described as sharp and aching. His diabetes is very well controlled so would not think this is diabetic neuropathy. Will obtain uric acid level.  Past Medical History:  Diagnosis Date  . Acid reflux   . Arthritis   . Diabetes mellitus without complication ([facility])   . Gout   . High cholesterol   . Hypertension   . Obesity   . Pneumonia     Past Surgical History:  Procedure Laterality Date  . ANTERIOR CERVICAL DECOMP/DISCECTOMY FUSION N/A 03/17/2018   Procedure: CERVICAL FOUR-FIVE, CERVCAL FIVE-SIX ANTERIOR CERVICAL DECOMPRESSION/DISCECTOMY FUSION, ALLOGRAFT PLATE;  Surgeon: Eldred MangesYates, Mark C, MD;  Location: [facility];  Service: Orthopedics;  Laterality: N/A;  . CYSTECTOMY     testicle   . KNEE ARTHROSCOPY     left two knee surgery   . SHOULDER ARTHROSCOPY     one left shoulder   . TONSILLECTOMY      Family History  Problem Relation Age of Onset  . Cirrhosis Father   . Stroke Father   . Hypertension Brother     Social History   Socioeconomic History  . Marital  status: Married    Spouse name: Not on file  . Number of children: Not on file  . Years of education: Not on file  . Highest education level: Not on file  Occupational History  . Not on file  Social Needs  . Financial resource strain: Not on file  . Food insecurity:    Worry: Not on file    Inability: Not on file  . Transportation needs:    Medical: Not on file    Non-medical: Not on file  Tobacco Use  . Smoking status: Never Smoker  .  Smokeless tobacco: Never Used  Substance and Sexual Activity  . Alcohol use: Not Currently  . Drug use: No  . Sexual activity: Yes  Lifestyle  . Physical activity:    Days per week: Not on file    Minutes per session: Not on file  . Stress: Not on file  Relationships  . Social connections:    Talks on phone: Not on file    Gets together: Not on file    Attends religious service: Not on file    Active member of club or organization: Not on file    Attends meetings of clubs or organizations: Not on file    Relationship status: Not on file  Other Topics Concern  . Not on file  Social History Narrative  . Not on file     Observations/Objective: Awake, alert and oriented x 3   ROS  Assessment and Plan:   Diagnoses and all orders for this visit:  Essential hypertension Continue all antihypertensives as prescribed.  Remember to bring in your blood pressure log with you for your follow up appointment.  DASH/Mediterranean Diets are healthier choices for HTN.   Left foot pain -     Uric Acid Work on losing weight to help reduce bone pain. May alternate with heat and ice application for pain relief. May also alternate with acetaminophen and Ibuprofen as prescribed pain relief. Continue allopurinol at this time.    Follow Up Instructions Return in about 3 months (around 08/23/2018) for HTN.     I discussed the assessment and treatment plan with the patient. The patient was provided an opportunity to ask questions and all were answered. The patient agreed with the plan and demonstrated an understanding of the instructions.   The patient was advised to call back or seek an in-person evaluation if the symptoms worsen or if the condition fails to improve as anticipated.  I provided 26 minutes of non-face-to-face time during this encounter including median intraservice time, reviewing previous notes, labs, imaging, medications and explaining diagnosis and management.  Claiborne Rigg, FNP-BC

## 2018-05-24 ENCOUNTER — Ambulatory Visit: Payer: Self-pay | Attending: Family Medicine

## 2018-05-24 ENCOUNTER — Other Ambulatory Visit: Payer: Self-pay

## 2018-05-24 MED FILL — TRUE METRIX TEST STRIP: 25 days supply | Qty: 50 | Fill #4

## 2018-05-24 MED FILL — LISINOPRIL-HCTZ 10-12.5 MG: 10-12.5 | 30 days supply | Qty: 30 | Fill #5

## 2018-05-25 ENCOUNTER — Other Ambulatory Visit: Payer: Self-pay | Admitting: Nurse Practitioner

## 2018-05-25 DIAGNOSIS — M79672 Pain in left foot: Secondary | ICD-10-CM

## 2018-05-25 LAB — URIC ACID: Uric Acid: 4.5 mg/dL (ref 3.7–8.6)

## 2018-05-30 ENCOUNTER — Other Ambulatory Visit: Payer: Self-pay

## 2018-05-30 ENCOUNTER — Ambulatory Visit (INDEPENDENT_AMBULATORY_CARE_PROVIDER_SITE_OTHER): Payer: Self-pay

## 2018-05-30 ENCOUNTER — Encounter: Payer: Self-pay | Admitting: Orthopaedic Surgery

## 2018-05-30 ENCOUNTER — Ambulatory Visit (INDEPENDENT_AMBULATORY_CARE_PROVIDER_SITE_OTHER): Payer: Self-pay | Admitting: Orthopaedic Surgery

## 2018-05-30 ENCOUNTER — Telehealth: Payer: Self-pay

## 2018-05-30 VITALS — Ht 68.0 in | Wt 218.0 lb

## 2018-05-30 DIAGNOSIS — M25552 Pain in left hip: Secondary | ICD-10-CM

## 2018-05-30 DIAGNOSIS — M542 Cervicalgia: Secondary | ICD-10-CM

## 2018-05-30 DIAGNOSIS — M25551 Pain in right hip: Secondary | ICD-10-CM

## 2018-05-30 DIAGNOSIS — Z981 Arthrodesis status: Secondary | ICD-10-CM

## 2018-05-30 DIAGNOSIS — M1612 Unilateral primary osteoarthritis, left hip: Secondary | ICD-10-CM

## 2018-05-30 NOTE — Progress Notes (Signed)
Office Visit Note   Patient: Christopher Hurst.           Date of Birth: 04/25/1965           MRN: [id number] Visit Date: 05/30/2018              Requested by: Claiborne Rigg, NP [address] [place], [place] [address] PCP: Claiborne Rigg, NP   Assessment & Plan: Visit Diagnoses:  1. Bilateral hip pain   2. Neck pain   3. Unilateral primary osteoarthritis, left hip   4. S/P cervical spinal fusion     Plan: Post cervical spine fusion doing well.  His left hip osteoarthritis is severe and he will require left total hip arthroplasty.  We discussed direct anterior approach 1-2 night stay in the hospital, spinal anesthesia.  He will continue to have to use a cane or the walker.  Risk surgery discussed including femur fracture, hip dislocation, revision, infection, risks of anesthesia.  Questions elicited and answered he understands request to proceed.  Follow-Up Instructions: No follow-ups on file.   Orders:  Orders Placed This Encounter  Procedures  . XR HIPS BILAT W OR W/O PELVIS 3-4 VIEWS  . XR Cervical Spine 2 or 3 views   No orders of the defined types were placed in this encounter.     Procedures: No procedures performed   Clinical Data: No additional findings.   Subjective: Chief Complaint  Patient presents with  . Neck - Pain    03/17/2018 C4-5, C5-6 ACDF, Allograft, Plate  . Right Hip - Pain  . Left Hip - Pain    HPI 53 year old male returns post two-level cervical fusion C4-5 C5-6 on 03/17/2018.  He states he has had a little bit of stiffness in his neck occasionally some discomfort in the posterior neck but preop arm pain and severe neck pain is resolved.  He has had problems with severe left hip pain and is having great difficulty ambulating without use of a cane.  He has severe left hip osteoarthritis and moderately severe hip arthritis on the right hip.  Patient states he is ready to proceed with hip replacement on his left hip.  Patient has history of  gout and uric acid on 05/24/2018 was in therapeutic range of 4.5 and he remains on allopurinol.  Cervical spine x-ray showed good incorporation and solid fusion at both levels.  Patient is still taking diclofenac and Tylenol for his hip without relief.  Patient's had some recent discomfort in his left midfoot without erythema or typical gout pain.  No fever no radicular leg symptoms.  Review of Systems 14 point system positive for solid cervical fusion from 03/17/2018.  Positive for obesity, gout without current symptoms.  Positive for high cholesterol and bilateral hip osteoarthritis worse on the left than right hip.   Objective: Vital Signs: Ht 5\' 8"  (1.727 m)   Wt 218 lb (98.9 kg)   BMI 33.15 kg/m   Physical Exam Constitutional:      Appearance: He is well-developed.  HENT:     Head: Normocephalic and atraumatic.  Eyes:     Pupils: Pupils are equal, round, and reactive to light.  Neck:     Thyroid: No thyromegaly.     Trachea: No tracheal deviation.  Cardiovascular:     Rate and Rhythm: Normal rate.  Pulmonary:     Effort: Pulmonary effort is normal.     Breath sounds: No wheezing.  Abdominal:  General: Bowel sounds are normal.     Palpations: Abdomen is soft.  Skin:    General: Skin is warm and dry.     Capillary Refill: Capillary refill takes less than 2 seconds.  Neurological:     Mental Status: He is alert and oriented to person, place, and time.  Psychiatric:        Behavior: Behavior normal.        Thought Content: Thought content normal.        Judgment: Judgment normal.     Ortho Exam patient has intact upper extremity reflexes.  No brachial plexus tenderness.  Left hip has only 10 degrees internal rotation left hip with severe hip pain.  He cannot figure for his hip.  External rotation only 20 degrees.  Opposite right hip is 20 degrees internal and external rotation with mild to moderate pain.  Distal pulses are intact.  Slight decreased range of motion first MTP  joint right and left without synovitis.  Midfoot range of motion is normal no dorsal osteophytes subtalar motion ankle range of motion is normal.  Posterior tib peroneals are normal.  No hip flexion contracture right or left.  He is amatory with a pronounced left Trendelenburg gait.  Specialty Comments:  No specialty comments available.  Imaging: Xr Hips Bilat W Or W/o Pelvis 3-4 Views  Result Date: 05/30/2018 Standing AP pelvis and frog-leg left hip obtained and reviewed.  This shows moderate severe right hip osteoarthritis with a few millimeters left in the joint space marginal osteophytes.  Left hip has no joint space bone-on-bone changes marginal osteophyte subchondral sclerosis and some head deformity.  Left hip arthritis is severe.  Negative for acute changes. Impression: Severe left and moderately severe right hip osteoarthritis  Xr Cervical Spine 2 Or 3 Views  Result Date: 05/30/2018 AP and lateral flexion-extension C-spine x-rays obtained and reviewed.  This shows C4-5 C5-6 allograft with fusion solid with good bone incorporation no motion on flexion-extension. Impression: Solid two-level cervical fusion C4-5 C5-6.  There is good graft incorporation.    PMFS History: Patient Active Problem List   Diagnosis Date Noted  . Unilateral primary osteoarthritis, left hip 05/02/2018  . S/P cervical spinal fusion 05/02/2018  . Arthritis of left hip 07/19/2017  . Other bilateral secondary osteoarthritis of knee 07/19/2017  . Spondylosis of thoracic region without myelopathy or radiculopathy 07/19/2017  . Midline low back pain without sciatica 04/13/2017  . Obesity   . Essential hypertension   . High cholesterol   . Gout    Past Medical History:  Diagnosis Date  . Acid reflux   . Arthritis   . Diabetes mellitus without complication (HCC)   . Gout   . High cholesterol   . Hypertension   . Obesity   . Pneumonia     Family History  Problem Relation Age of Onset  . Cirrhosis  Father   . Stroke Father   . Hypertension Brother     Past Surgical History:  Procedure Laterality Date  . ANTERIOR CERVICAL DECOMP/DISCECTOMY FUSION N/A 03/17/2018   Procedure: CERVICAL FOUR-FIVE, CERVCAL FIVE-SIX ANTERIOR CERVICAL DECOMPRESSION/DISCECTOMY FUSION, ALLOGRAFT PLATE;  Surgeon: Eldred Manges, MD;  Location: [facility] OR;  Service: Orthopedics;  Laterality: N/A;  . CYSTECTOMY     testicle   . KNEE ARTHROSCOPY     left two knee surgery   . SHOULDER ARTHROSCOPY     one left shoulder   . TONSILLECTOMY     Social History  Occupational History  . Not on file  Tobacco Use  . Smoking status: Never Smoker  . Smokeless tobacco: Never Used  Substance and Sexual Activity  . Alcohol use: Not Currently  . Drug use: No  . Sexual activity: Yes

## 2018-05-30 NOTE — Telephone Encounter (Signed)
CMA spoke to patient to inform on results and referral.  Pt. Verified DOB. Pt. Understood.  

## 2018-05-30 NOTE — Telephone Encounter (Signed)
-----   Message from Claiborne Rigg, NP sent at 05/25/2018 10:09 PM EDT ----- Uric acid is normal. We will continue allopurinol. Will refer to podiatry as foot pain is not likely related to gout.

## 2018-06-03 ENCOUNTER — Encounter (HOSPITAL_COMMUNITY): Payer: Self-pay | Admitting: Emergency Medicine

## 2018-06-03 ENCOUNTER — Other Ambulatory Visit: Payer: Self-pay

## 2018-06-03 ENCOUNTER — Emergency Department (HOSPITAL_COMMUNITY)
Admission: EM | Admit: 2018-06-03 | Discharge: 2018-06-03 | Disposition: A | Discharge status: Home / Self Care | Payer: Medicaid Other | Attending: Emergency Medicine | Admitting: Emergency Medicine

## 2018-06-03 DIAGNOSIS — I10 Essential (primary) hypertension: Secondary | ICD-10-CM | POA: Insufficient documentation

## 2018-06-03 DIAGNOSIS — R0982 Postnasal drip: Secondary | ICD-10-CM

## 2018-06-03 DIAGNOSIS — Z79899 Other long term (current) drug therapy: Secondary | ICD-10-CM | POA: Insufficient documentation

## 2018-06-03 DIAGNOSIS — E119 Type 2 diabetes mellitus without complications: Secondary | ICD-10-CM | POA: Insufficient documentation

## 2018-06-03 MED ORDER — SODIUM CHLORIDE-SODIUM BICARB 2300-700 MG NA KIT: 1.0000 | PACK | Freq: Every day | NASAL | 0 refills | Status: DC | PRN

## 2018-06-03 MED ORDER — DEXTROMETHORPHAN-GUAIFENESIN 10-200 MG PO CAPS: 2.0000 | ORAL_CAPSULE | Freq: Four times a day (QID) | ORAL | 0 refills | Status: DC | PRN

## 2018-06-03 NOTE — ED Provider Notes (Signed)
Emergency Department Provider Note   I have reviewed the triage vital signs and the nursing notes.   HISTORY  Chief Complaint Nasal Congestion   HPI Christopher Hurst. is a 53 y.o. male with multiple medical problems as documented below.  Sent to the emergency department today secondary to nasal congestion and postnasal drip.  Patient states is been going on for little over a week.  Has not taken thing for his symptoms as his doctor told him not to.  He denied a fever he does have a little bit of cough but it is more from the dripping that it is from anything deep in his chest.  Patient states that symptoms gets worse when he eats.  Sometimes the milk makes it worse.  Complains of sinus pressure.  No other associated symptoms.   No other associated or modifying symptoms.    Past Medical History:  Diagnosis Date  . Acid reflux   . Arthritis   . Diabetes mellitus without complication ([facility])   . Gout   . High cholesterol   . Hypertension   . Obesity   . Pneumonia     Patient Active Problem List   Diagnosis Date Noted  . Unilateral primary osteoarthritis, left hip 05/02/2018  . S/P cervical spinal fusion 05/02/2018  . Arthritis of left hip 07/19/2017  . Other bilateral secondary osteoarthritis of knee 07/19/2017  . Spondylosis of thoracic region without myelopathy or radiculopathy 07/19/2017  . Midline low back pain without sciatica 04/13/2017  . Obesity   . Essential hypertension   . High cholesterol   . Gout     Past Surgical History:  Procedure Laterality Date  . ANTERIOR CERVICAL DECOMP/DISCECTOMY FUSION N/A 03/17/2018   Procedure: CERVICAL FOUR-FIVE, CERVCAL FIVE-SIX ANTERIOR CERVICAL DECOMPRESSION/DISCECTOMY FUSION, ALLOGRAFT PLATE;  Surgeon: [name], MD;  Location: [facility];  Service: Orthopedics;  Laterality: N/A;  . CYSTECTOMY     testicle   . KNEE ARTHROSCOPY     left two knee surgery   . SHOULDER ARTHROSCOPY     one left shoulder   . TONSILLECTOMY      Current Outpatient Rx  . Order #: [id number] Class: Normal  . Order #: [id number] Class: Normal  . Order #: [id number] Class: Normal  . Order #: [id number] Class: Print  . Order #: [id number] Class: Normal  . Order #: [id number] Class: Normal  . Order #: [id number] Class: Normal  . Order #: [id number] Class: Normal  . Order #: [id number] Class: Normal  . Order #: [id number] Class: Normal  . Order #: [id number] Class: Historical Med  . Order #: [id number] Class: Print  . Order #: [id number] Class: Normal    Allergies Patient has no known allergies.  Family History  Problem Relation Age of Onset  . Cirrhosis Father   . Stroke Father   . Hypertension Brother     Social History Social History   Tobacco Use  . Smoking status: Never Smoker  . Smokeless tobacco: Never Used  Substance Use Topics  . Alcohol use: Not Currently  . Drug use: No    Review of Systems  All other systems negative except as documented in the HPI. All pertinent positives and negatives as reviewed in the HPI. ____________________________________________   PHYSICAL EXAM:  VITAL SIGNS: ED Triage Vitals  Enc Vitals Group     BP 06/03/18 0022 (!) 142/80     Pulse Rate 06/03/18 0022 90     Resp 06/03/18 0022 20     Temp 06/03/18 0022 99.2 F (37.3 C)  Temp Source 06/03/18 0022 Oral     SpO2 06/03/18 0022 97 %     Weight 06/03/18 0143 218 lb (98.9 kg)     Height 06/03/18 0143 _0  (1.727 m)    Constitutional: Alert and oriented. Well appearing and in no acute distress. Eyes: Conjunctivae are normal. PERRL. EOMI. Head: Atraumatic. Nose: No congestion/rhinnorhea. Mouth/Throat: Mucous membranes are moist.  Oropharynx with mild posterior erythema. Neck: No stridor.  No meningeal signs.   Cardiovascular: Normal rate, regular rhythm. Good peripheral circulation. Grossly normal heart sounds.   Respiratory: Normal respiratory effort.  No retractions. Lungs CTAB. Gastrointestinal: Soft and nontender. No distention.   Musculoskeletal: No lower extremity tenderness nor edema. No gross deformities of extremities. Neurologic:  Normal speech and language. No gross focal neurologic deficits are appreciated.  Skin:  Skin is warm, dry and intact. No rash noted.  ____________________________________________   INITIAL IMPRESSION / ASSESSMENT AND PLAN / ED COURSE  Suspect likely allergic versus viral sinusitis.  No indication for antibiotics at this time.  Will try symptomatic treatment at home with PCP follow-up if not improved in 1 week.  Low suspicion for a bacterial infection.  Christopher Ortencia Kick. was evaluated in Emergency Department on 06/03/2018 for the symptoms described in the history of present illness. He was evaluated in the context of the global COVID-19 pandemic, which necessitated consideration that the patient might be at risk for infection with the SARS-CoV-2 virus that causes COVID-19. Institutional protocols and algorithms that pertain to the evaluation of patients at risk for COVID-19 are in a state of rapid change based on information released by regulatory bodies including the CDC and federal and state organizations. These policies and algorithms were followed during the patient's care in the ED.  Pertinent labs & imaging results that were available during my care of the patient were reviewed by me and considered in my medical decision making (see chart for details).  A medical screening exam was performed and I feel the patient has had an appropriate workup for their chief complaint at this time and likelihood of emergent condition existing is low. They have been counseled on decision, discharge, follow up and which symptoms necessitate immediate return to the emergency department. They or their family verbally stated understanding and agreement with plan and discharged in stable condition.   ____________________________________________  FINAL CLINICAL IMPRESSION(S) / ED DIAGNOSES  Final diagnoses:   PND (post-nasal drip)     MEDICATIONS GIVEN DURING THIS VISIT:  Medications - No data to display   NEW OUTPATIENT MEDICATIONS STARTED DURING THIS VISIT:  Discharge Medication List as of 06/03/2018  2:11 AM    START taking these medications   Details  Dextromethorphan-guaiFENesin (CORICIDIN HBP CONGESTION/COUGH) 10-200 MG CAPS Take 2 capsules by mouth every 6 (six) hours as needed., Starting Sat 06/03/2018, Print    Sodium Chloride-Sodium Bicarb (NETI POT SINUS WASH) 2300-700 MG KIT Place 1 kit into the nose daily as needed (congestion)., Starting Sat 06/03/2018, Print        Note:  This note was prepared with assistance of Dragon voice recognition software. Occasional wrong-word or sound-a-like substitutions may have occurred due to the inherent limitations of voice recognition software.   Dannetta Lekas, Corene Cornea, MD 06/03/18 [phone]

## 2018-06-03 NOTE — ED Triage Notes (Signed)
Reports nasal congestion x 1 week.  Pt states he is using nasal spray and now spitting up phlegm.  Denies cough.  States he thinks it is his allergies but wanted to make sure.

## 2018-06-08 MED FILL — metFORMIN HCL 500 MG TABS: 500 | 90 days supply | Qty: 180 | Fill #1

## 2018-06-13 ENCOUNTER — Ambulatory Visit: Payer: Self-pay | Admitting: Orthopaedic Surgery

## 2018-06-19 ENCOUNTER — Telehealth: Payer: Self-pay | Admitting: Nurse Practitioner

## 2018-06-19 ENCOUNTER — Other Ambulatory Visit: Payer: Self-pay

## 2018-06-19 ENCOUNTER — Ambulatory Visit: Payer: Self-pay | Attending: Nurse Practitioner

## 2018-06-19 NOTE — Telephone Encounter (Signed)
I called Pt since was missing some documents for the CAFA appliaction

## 2018-06-26 MED FILL — LISINOPRIL-HCTZ 10-12.5 TAB: 10-12.5 | 30 days supply | Qty: 30 | Fill #6

## 2018-06-26 MED FILL — TRUE METRIX TEST STRIP: 50 days supply | Qty: 100 | Fill #5

## 2018-07-07 ENCOUNTER — Ambulatory Visit: Payer: No Typology Code available for payment source | Admitting: Sports Medicine

## 2018-07-14 ENCOUNTER — Ambulatory Visit: Payer: Self-pay | Admitting: Sports Medicine

## 2018-07-14 ENCOUNTER — Other Ambulatory Visit: Payer: Self-pay | Admitting: Sports Medicine

## 2018-07-14 ENCOUNTER — Ambulatory Visit: Payer: Self-pay

## 2018-07-14 ENCOUNTER — Other Ambulatory Visit: Payer: Self-pay

## 2018-07-14 ENCOUNTER — Encounter: Payer: Self-pay | Admitting: Sports Medicine

## 2018-07-14 VITALS — Temp 98.6°F | Resp 16

## 2018-07-14 DIAGNOSIS — M19072 Primary osteoarthritis, left ankle and foot: Secondary | ICD-10-CM

## 2018-07-14 DIAGNOSIS — E119 Type 2 diabetes mellitus without complications: Secondary | ICD-10-CM

## 2018-07-14 DIAGNOSIS — M79671 Pain in right foot: Secondary | ICD-10-CM

## 2018-07-14 DIAGNOSIS — M779 Enthesopathy, unspecified: Secondary | ICD-10-CM

## 2018-07-14 NOTE — Progress Notes (Signed)
Subjective: Edwing Figley. is a 53 y.o. male patient who presents to office for evaluation of left foot pain. Patient complains of progressive pain especially over the last month 6 out of 10 pain especially with direct pressure and has noticed that his bone is sticking up.  Patient reports that the pain is worse with movement.  Patient reports that he has tried icing and Tylenol without complete relief.  Patient denies any other pedal complaints. Denies injury/trip/fall/sprain/any causative factors.   Review of Systems  Musculoskeletal: Positive for joint pain and myalgias.  All other systems reviewed and are negative.    Patient Active Problem List   Diagnosis Date Noted  . Unilateral primary osteoarthritis, left hip 05/02/2018  . S/P cervical spinal fusion 05/02/2018  . Arthritis of left hip 07/19/2017  . Other bilateral secondary osteoarthritis of knee 07/19/2017  . Spondylosis of thoracic region without myelopathy or radiculopathy 07/19/2017  . Midline low back pain without sciatica 04/13/2017  . Obesity   . Essential hypertension   . High cholesterol   . Gout     Current Outpatient Medications on File Prior to Visit  Medication Sig Dispense Refill  . allopurinol (ZYLOPRIM) 100 MG tablet Take 2 tablets (200 mg total) by mouth daily. 180 tablet 1  . atorvastatin (LIPITOR) 40 MG tablet Take 1 tablet (40 mg total) by mouth daily. 90 tablet 3  . Blood Glucose Monitoring Suppl (TRUE METRIX METER) w/Device KIT Use as instructed. Check blood glucose levels 2 times per day 1 kit 0  . glucose blood (TRUE METRIX BLOOD GLUCOSE TEST) test strip Use as instructed. Check blood glucose levels 2 times per day 100 each 11  . lisinopril-hydrochlorothiazide (ZESTORETIC) 10-12.5 MG tablet Take 1 tablet by mouth daily. 90 tablet 3  . metFORMIN (GLUCOPHAGE) 500 MG tablet Take 1 tablet (500 mg total) by mouth 2 (two) times daily with a meal. 180 tablet 3  . TRUEPLUS LANCETS 28G MISC Use as instructed.  Check blood glucose levels 2 times per day 100 each 3  . omeprazole (PRILOSEC) 20 MG capsule Take 1 capsule (20 mg total) by mouth daily. 90 capsule 2   No current facility-administered medications on file prior to visit.     No Known Allergies  Objective:  General: Alert and oriented x3 in no acute distress  Dermatology: Raised palpable bony prominence noted at the dorsal midfoot likely bone spur.  No open lesions bilateral lower extremities, no webspace macerations, no ecchymosis bilateral, all nails x 10 are well manicured.  Vascular: Dorsalis Pedis and Posterior Tibial pedal pulses palpable, Capillary Fill Time 3 seconds,(+) pedal hair growth bilateral, no edema bilateral lower extremities, Temperature gradient within normal limits.  Neurology: Johney Maine sensation intact via light touch bilateral.  Protective and vibratory slightly diminished bilateral.  Musculoskeletal: Mild tenderness with palpation at dorsal left midfoot at bony spur,No pain with calf compression bilateral.  Planus foot type.. Strength within normal limits in all groups bilateral.   Gait: Antalgic gait  Xrays  Left foot   Impression: Significant dorsal midfoot bone spur at the talonavicular joint with enlargement there is midtarsal breech supportive of pes planus deformity, there is inferior heel spur, there is mild soft tissue swelling around the bone spur.  No other acute findings.  Assessment and Plan: Problem List Items Addressed This Visit    None    Visit Diagnoses    Arthritis of left foot    -  Primary   Capsulitis  Diabetes mellitus without complication ([place])           -Complete examination performed -Xrays reviewed -Discussed treatement options for capsulitis with arthritis -Patient declined steroid injection at this time -Continue with Tylenol as needed -Advised patient on alternate lacing to prevent rubbing" over the bump -Recommend patient to use topical pain creams and rubs as needed to  the area -Advised patient if pain continues to worsen the next step would be to have an injection and then after that if the pain is still bothersome a consult for surgery to fuse the joint and to reduce the bone spur -Patient to continue with anti-inflammatories and long-term gout medication to prevent flareup of gout in any of these arthritic joint areas -Patient to return to office as needed or sooner if condition worsens.  Advised patient to call office if pain is not better after 3 to 4 weeks for follow-up evaluation. Landis Martins, DPM

## 2018-07-14 NOTE — Progress Notes (Signed)
   Subjective:    Patient ID: Christopher Hurst., male    DOB: July 14, 1965, 53 y.o.   MRN: [id number]  HPI    Review of Systems  Musculoskeletal: Positive for arthralgias and myalgias.  All other systems reviewed and are negative.      Objective:   Physical Exam        Assessment & Plan:

## 2018-07-24 MED FILL — ?ATORVASTATIN 40MG TABLET: 40 | 30 days supply | Qty: 30 | Fill #1

## 2018-07-24 MED FILL — ?ALLOPURINOL 100MG TABLET: 100 | 30 days supply | Qty: 60 | Fill #1

## 2018-07-24 MED FILL — TRUEplus LANCETS 28G MISC: 50 days supply | Qty: 100 | Fill #1

## 2018-07-24 MED FILL — LISINOPRIL-HCTZ 10-12.5 MG: 10-12.5 | 30 days supply | Qty: 30 | Fill #7

## 2018-08-21 MED FILL — ?ALLOPURINOL 100MG TABLET: 100 | 30 days supply | Qty: 60 | Fill #2

## 2018-08-21 MED FILL — TRUE METRIX TEST STRIP: 50 days supply | Qty: 100 | Fill #6

## 2018-08-21 MED FILL — LISINOPRIL-HCTZ 10-12.5 MG: 10-12.5 | 30 days supply | Qty: 30 | Fill #8

## 2018-08-22 ENCOUNTER — Telehealth: Payer: Self-pay | Admitting: Orthopaedic Surgery

## 2018-08-22 NOTE — Telephone Encounter (Signed)
Patient is scheduled for left total hip 09-15-18, however there is a change in the provider's schedule in which he will not be operating on this day.  I have not been successful in contacting  patient at  [phone] over the last two weeks. The recording says "the person you are calling cannot accept calls at this time".  I have also called an additional contact number for Mardene Celeste (patient's wife) and left a message requesting him to call me directly at [phone].  If patient calls, please obtain a reliable and working number.  I will send out a note by mail today informing patient the need to call our office so we can reschedule his surgery with Dr. Lorin Mercy.

## 2018-08-22 NOTE — Telephone Encounter (Signed)
noted 

## 2018-09-04 MED FILL — ?METFORMIN HCL 500MG TABLET: 500 | 90 days supply | Qty: 180 | Fill #2

## 2018-09-04 MED FILL — ?ATORVASTATIN 40MG TABLET: 40 | 30 days supply | Qty: 30 | Fill #2

## 2018-09-15 ENCOUNTER — Inpatient Hospital Stay: Admit: 2018-09-15 | Payer: Self-pay | Admitting: Orthopaedic Surgery

## 2018-09-15 SURGERY — ARTHROPLASTY, HIP, TOTAL, ANTERIOR APPROACH
Anesthesia: Spinal | Laterality: Left

## 2018-09-20 MED FILL — TRUEplus LANCETS 28G MISC: 50 days supply | Qty: 100 | Fill #2

## 2018-09-20 MED FILL — LISINOPRIL-HCTZ 10-12.5 MG: 10-12.5 | 30 days supply | Qty: 30 | Fill #9

## 2018-09-20 MED FILL — ?ALLOPURINOL 100MG TABLET: 100 | 30 days supply | Qty: 60 | Fill #3

## 2018-09-29 ENCOUNTER — Other Ambulatory Visit: Payer: Self-pay | Admitting: Pharmacist

## 2018-09-29 DIAGNOSIS — E119 Type 2 diabetes mellitus without complications: Secondary | ICD-10-CM

## 2018-09-29 MED ORDER — TRUE METRIX METER W/DEVICE KIT: PACK | 0 refills | Status: AC

## 2018-09-29 MED FILL — !TRUE METRIX BLOOD GLUCOSE: 365 days supply | Qty: 1 | Fill #0

## 2018-10-11 MED FILL — TRUE METRIX TEST STRIP: 50 days supply | Qty: 100 | Fill #7

## 2018-10-11 MED FILL — ?ATORVASTATIN 40MG TABLET: 40 | 30 days supply | Qty: 30 | Fill #3

## 2018-10-18 ENCOUNTER — Ambulatory Visit: Payer: Self-pay

## 2018-10-18 ENCOUNTER — Encounter: Payer: Self-pay | Admitting: Surgery

## 2018-10-18 ENCOUNTER — Ambulatory Visit (INDEPENDENT_AMBULATORY_CARE_PROVIDER_SITE_OTHER): Payer: Self-pay | Admitting: Surgery

## 2018-10-18 ENCOUNTER — Other Ambulatory Visit: Payer: Self-pay

## 2018-10-18 DIAGNOSIS — M1612 Unilateral primary osteoarthritis, left hip: Secondary | ICD-10-CM

## 2018-10-18 DIAGNOSIS — M25512 Pain in left shoulder: Secondary | ICD-10-CM

## 2018-10-18 DIAGNOSIS — M25532 Pain in left wrist: Secondary | ICD-10-CM

## 2018-10-18 DIAGNOSIS — M542 Cervicalgia: Secondary | ICD-10-CM

## 2018-10-18 NOTE — Progress Notes (Signed)
Office Visit Note   Patient: Christopher Hurst.           Date of Birth: 06/17/1965           MRN: [id number] Visit Date: 10/18/2018              Requested by: Gildardo Pounds, NP [address] [place],  [place] [address] PCP: Maggie Schwalbe, PA-C   Assessment & Plan: Visit Diagnoses:  1. Unilateral primary osteoarthritis, left hip   2. Pain in left wrist   3. Neck pain   4. Left shoulder pain, unspecified chronicity     Plan: Patient will follow-up me in 2 weeks for recheck.  Follow-Up Instructions: Return in about 2 weeks (around 11/01/2018).   Orders:  Orders Placed This Encounter  Procedures   XR HIP UNILAT W OR W/O PELVIS 2-3 VIEWS LEFT   XR Wrist Complete Left   XR Cervical Spine 2 or 3 views   XR Shoulder Left   No orders of the defined types were placed in this encounter.     Procedures: No procedures performed   Clinical Data: No additional findings.   Subjective: Chief Complaint  Patient presents with   Neck - Injury, Pain   Left Shoulder - Injury, Pain   Left Hip - Injury, Pain   Left Wrist - Injury, Pain    HPI  Review of Systems   Objective: Vital Signs: There were no vitals taken for this visit.  Physical Exam  Ortho Exam  Specialty Comments:  No specialty comments available.  Imaging: No results found.   PMFS History: Patient Active Problem List   Diagnosis Date Noted   Cubital tunnel syndrome on right 08/19/2021   Arthritis of right hip 08/03/2021   Unilateral primary osteoarthritis, right hip 02/17/2021   S/P total left hip arthroplasty 10/24/2019   Diabetes mellitus without complication ([facility])    S/P cervical spinal fusion 05/02/2018   Other bilateral secondary osteoarthritis of knee 07/19/2017   Spondylosis of thoracic region without myelopathy or radiculopathy 07/19/2017   Midline low back pain without sciatica 04/13/2017   Obesity    Essential hypertension    High cholesterol    Gout     Past Medical History:  Diagnosis Date   Acid reflux    Allergies    Anxiety    per pt, no medications per patient   Arthritis    B/L hips   Diabetes mellitus without complication (HCC)    Gout    High cholesterol    Hypertension    Obesity    Pneumonia    as a kid    Family History  Problem Relation Age of Onset   Healthy Mother    Cirrhosis Father    Stroke Father    Alzheimer's disease Father    Alcohol abuse Father    Hypertension Brother     Past Surgical History:  Procedure Laterality Date   ANTERIOR CERVICAL DECOMP/DISCECTOMY FUSION N/A 03/17/2018   Procedure: CERVICAL FOUR-FIVE, CERVCAL FIVE-SIX ANTERIOR CERVICAL DECOMPRESSION/DISCECTOMY FUSION, ALLOGRAFT PLATE;  Surgeon: [name], MD;  Location: [facility];  Service: Orthopedics;  Laterality: N/A;   CYSTECTOMY     testicle    KNEE ARTHROSCOPY     left knee x2   SHOULDER ARTHROSCOPY     one left shoulder    TONSILLECTOMY     TOTAL HIP ARTHROPLASTY Left 09/14/2019   Procedure: LEFT TOTAL HIP ARTHROPLASTY -DIRECT ANTERIOR;  Surgeon:  Eldred Manges, MD;  Location: [facility] OR;  Service: Orthopedics;  Laterality: Left;   TOTAL HIP ARTHROPLASTY Right 08/03/2021   Procedure: Right TOTAL HIP ARTHROPLASTY ANTERIOR APPROACH;  Surgeon: Eldred Manges, MD;  Location: [facility] OR;  Service: Orthopedics;  Laterality: Right;   Social History   Occupational History   Not on file  Tobacco Use   Smoking status: Never   Smokeless tobacco: Never  Vaping Use   Vaping Use: Never used  Substance and Sexual Activity   Alcohol use: Yes    Comment: stopped heavily drinking daily in 40s- drinks glass of wine once a year   Drug use: Not Currently    Types: Marijuana    Comment: in high school with friends   Sexual activity: Yes

## 2018-10-25 ENCOUNTER — Telehealth: Payer: Self-pay | Admitting: *Deleted

## 2018-10-25 MED FILL — ?ALLOPURINOL 100MG TABLET: 100 | 30 days supply | Qty: 60 | Fill #0

## 2018-10-25 MED FILL — LISINOPRIL-HCTZ 10-12.5 MG: 10-12.5 | 30 days supply | Qty: 30 | Fill #10

## 2018-10-25 NOTE — Telephone Encounter (Signed)
Patient fell a couple of weeks ago. He has left arm and shoulder pain. Was seen by the Ortho doctor but was told not to take the medication that was rx'd  By the [facility] ED.   He has an appointment on October 14, with Ortho who may refer him or send for a MRI.  Advised if it was nerve pain he would need something to take for his nerve. He states that he has diclofenac. Informed he could try that medication but he may not find relief in this medication. Advised to f/u with the Ortho. He verbalized understanding.

## 2018-10-30 IMAGING — DX DG LUMBAR SPINE 2-3V
3 series · 3 of 3 positions shown · non-contrast
Comparison: No recent.

CLINICAL DATA: Low back pain.  No fall or injury.

EXAM:
LUMBAR SPINE - 2-3 VIEW

[l-spine ap]
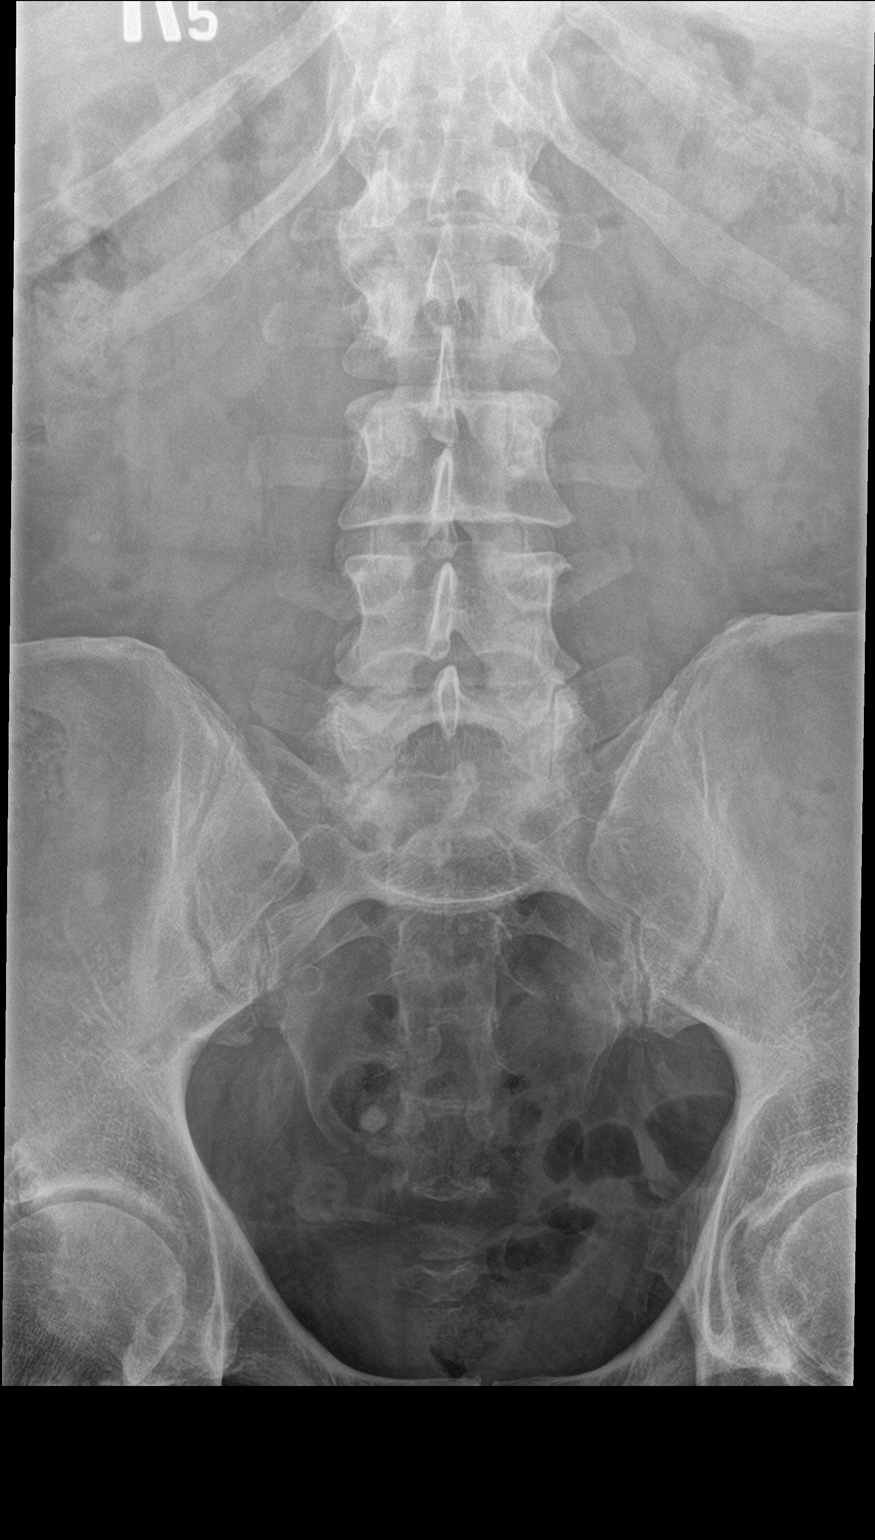

[l-spine lat]
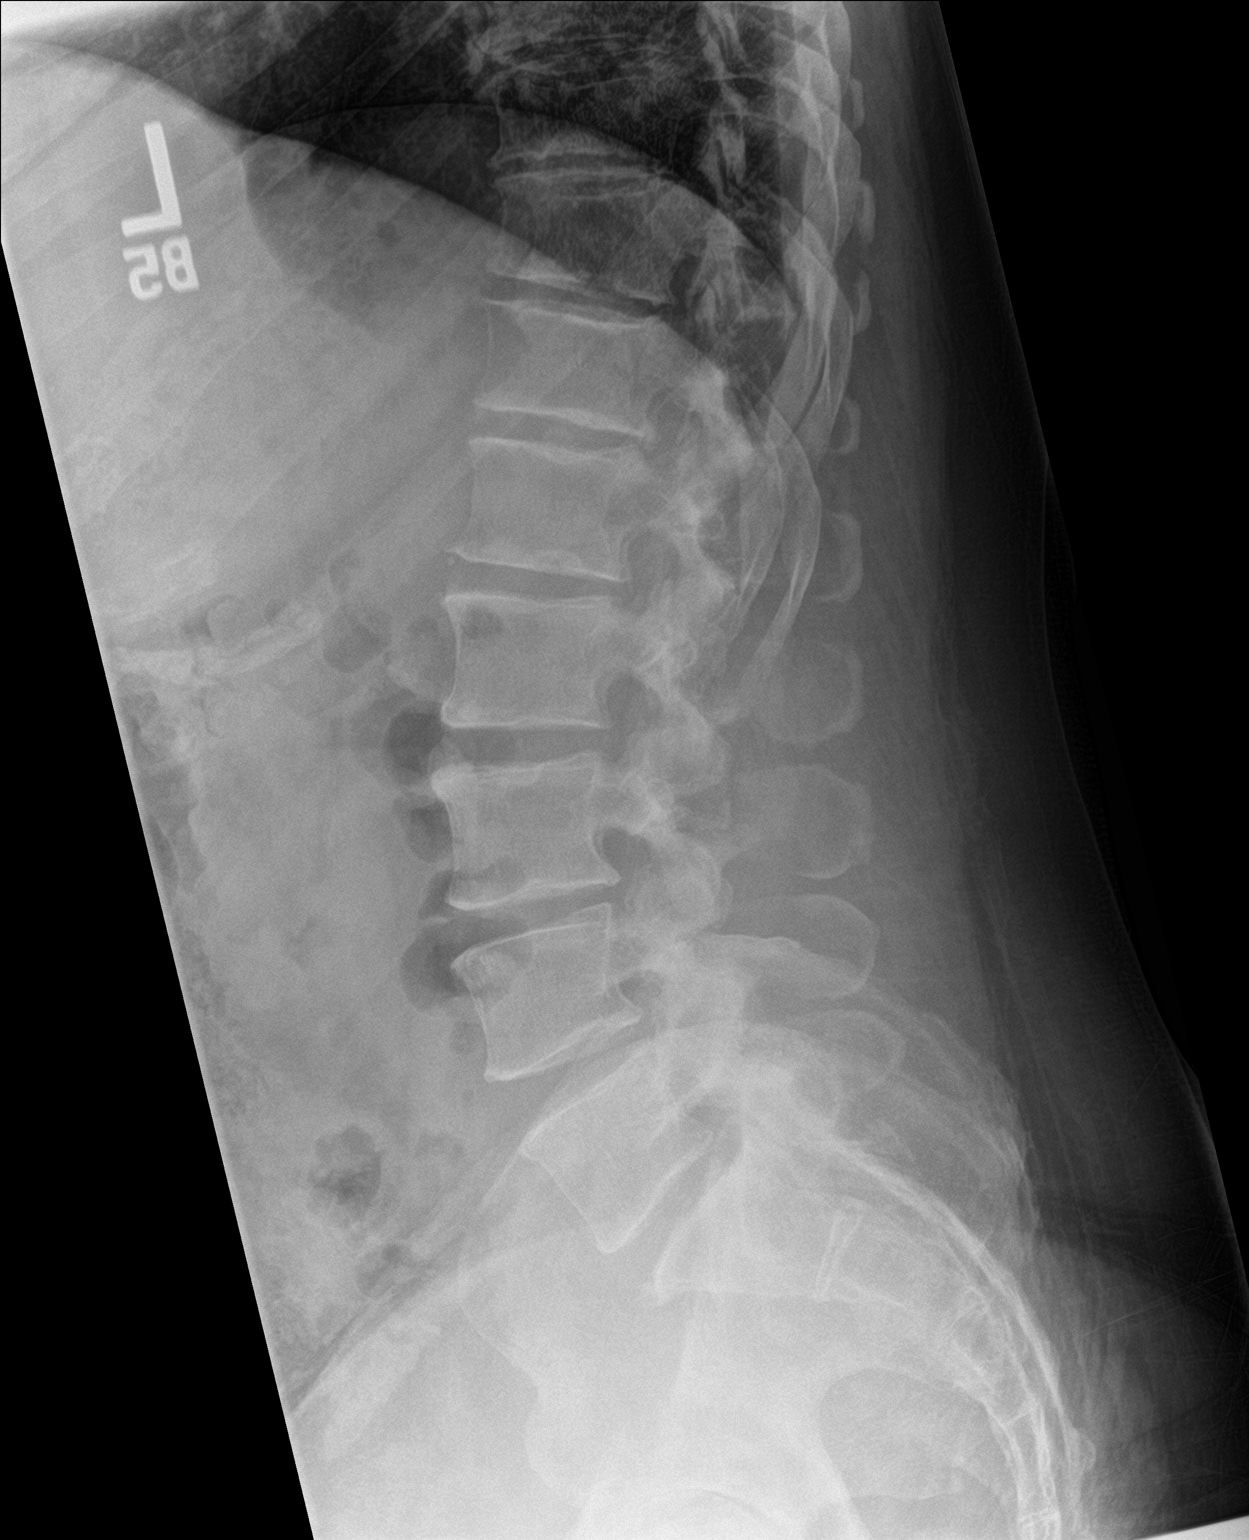

[l-spine spot]
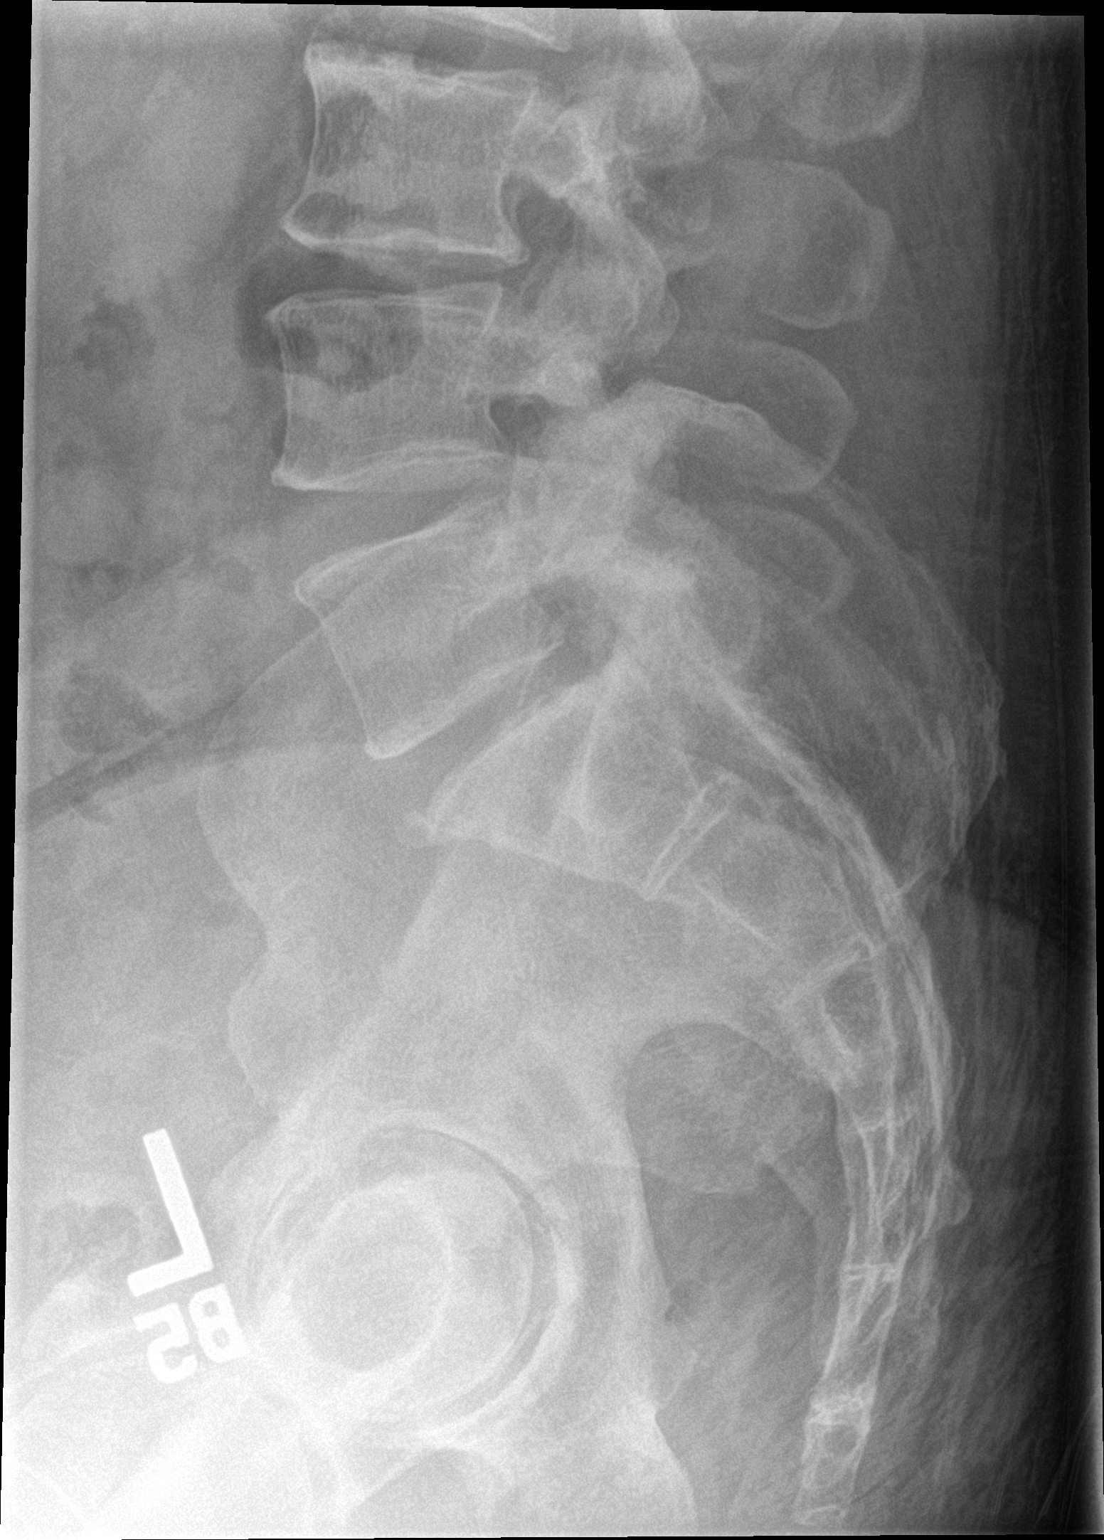

[3 of 3 positions shown; findings below may reference images not displayed]

FINDINGS: Mild T10 and T11 compression fractures, age undetermined. Diffuse
degenerative change. Degenerative changes both hips. Normal
alignment. Pelvic calcifications consistent with phleboliths.
IMPRESSION: 1.  Mild T10 and T11 compression fractures, age undetermined.

2.  Diffuse degenerative change lumbar spine and both hips.

## 2018-11-01 ENCOUNTER — Encounter: Payer: Self-pay | Admitting: Surgery

## 2018-11-01 ENCOUNTER — Ambulatory Visit (INDEPENDENT_AMBULATORY_CARE_PROVIDER_SITE_OTHER): Payer: Self-pay | Admitting: Surgery

## 2018-11-01 ENCOUNTER — Other Ambulatory Visit: Payer: Self-pay

## 2018-11-01 DIAGNOSIS — M25512 Pain in left shoulder: Secondary | ICD-10-CM

## 2018-11-01 DIAGNOSIS — G5622 Lesion of ulnar nerve, left upper limb: Secondary | ICD-10-CM

## 2018-11-01 MED ORDER — METHYLPREDNISOLONE ACETATE 40 MG/ML IJ SUSP
40.0000 mg | INTRAMUSCULAR | Status: AC | PRN
  Administered 2018-11-01: 40 mg via INTRA_ARTICULAR

## 2018-11-01 MED ORDER — LIDOCAINE HCL 1 % IJ SOLN
3.0000 mL | INTRAMUSCULAR | Status: AC | PRN
  Administered 2018-11-01: 15:00:00 3 mL

## 2018-11-01 MED ORDER — BUPIVACAINE HCL 0.25 % IJ SOLN
4.0000 mL | INTRAMUSCULAR | Status: AC | PRN
  Administered 2018-11-01: 15:00:00 4 mL via INTRA_ARTICULAR

## 2018-11-01 NOTE — Progress Notes (Signed)
Office Visit Note   Patient: Christopher Hurst.           Date of Birth: 07-04-1965           MRN: [id number] Visit Date: 11/01/2018              Requested by: Claiborne Rigg, NP [address] [place],  [place] [address] PCP: Claiborne Rigg, NP   Assessment & Plan: Visit Diagnoses:  1. Left shoulder pain, unspecified chronicity   2. Ulnar neuropathy at elbow, left     Plan: With patient's shoulder pain recommend trying a subacromial Marcaine/Depo-Medrol injection.  If he does not have any relief from the injection will need to get MRI to rule out rotator cuff tear or other shoulder pathology.  Follow-up in 4 weeks for recheck with Dr. Ophelia Charter and he can discuss treatment plan with patient regarding his end-stage DJD left hip. Follow-Up Instructions: Return in about 4 weeks (around 11/29/2018) for with Dr Ophelia Charter.   Orders:  No orders of the defined types were placed in this encounter.  No orders of the defined types were placed in this encounter.     Procedures: Large Joint Inj: L subacromial bursa on 11/01/2018 3:06 PM Details: 1.5 in posterior approach Medications: 3 mL lidocaine 1 %; 4 mL bupivacaine 0.25 %; 40 mg methylPREDNISolone acetate 40 MG/ML Outcome: tolerated well, no immediate complications Consent was given by the patient. Patient was prepped and draped in the usual sterile fashion.       Clinical Data: No additional findings.   Subjective: Chief Complaint  Patient presents with  . Left Shoulder - Pain  . Left Wrist - Pain    HPI Patient comes in.  He continues to complain of left shoulder pain since his fall.  Pain when he is attempting to reach overhead.  He is also complaining of pain at the left medial elbow with occasional pain numbness and tingling that extends down into his wrist and hand.  Feels like at times his left hand cramps up.  He continues to have pain in his left hip where he has known end-stage DJD. Review of Systems No current  cardiac pulmonary GI GU issues  Objective: Vital Signs: There were no vitals taken for this visit.  Physical Exam HENT:     Head: Normocephalic and atraumatic.  Musculoskeletal:     Comments: Gait is antalgic.  Left shoulder has good range of motion below discomfort.  Positive pinch test.  Negative drop arm test.  Left elbow good range of motion.  He is tender over the cubital tunnel.  Positive Tinel's cubital tunnel.  He has good range of motion of the wrist and fingers.  Neurological:     Mental Status: He is alert.  Psychiatric:        Mood and Affect: Mood normal.     Ortho Exam  Specialty Comments:  No specialty comments available.  Imaging: No results found.   PMFS History: Patient Active Problem List   Diagnosis Date Noted  . Unilateral primary osteoarthritis, left hip 05/02/2018  . S/P cervical spinal fusion 05/02/2018  . Arthritis of left hip 07/19/2017  . Other bilateral secondary osteoarthritis of knee 07/19/2017  . Spondylosis of thoracic region without myelopathy or radiculopathy 07/19/2017  . Midline low back pain without sciatica 04/13/2017  . Obesity   . Essential hypertension   . High cholesterol   . Gout    Past Medical History:  Diagnosis Date  .  Acid reflux   . Arthritis   . Diabetes mellitus without complication ([place])   . Gout   . High cholesterol   . Hypertension   . Obesity   . Pneumonia     Family History  Problem Relation Age of Onset  . Cirrhosis Father   . Stroke Father   . Hypertension Brother     Past Surgical History:  Procedure Laterality Date  . ANTERIOR CERVICAL DECOMP/DISCECTOMY FUSION N/A 03/17/2018   Procedure: CERVICAL FOUR-FIVE, CERVCAL FIVE-SIX ANTERIOR CERVICAL DECOMPRESSION/DISCECTOMY FUSION, ALLOGRAFT PLATE;  Surgeon: [name], MD;  Location: [place];  Service: Orthopedics;  Laterality: N/A;  . CYSTECTOMY     testicle   . KNEE ARTHROSCOPY     left two knee surgery   . SHOULDER ARTHROSCOPY     one left  shoulder   . TONSILLECTOMY     Social History   Occupational History  . Not on file  Tobacco Use  . Smoking status: Never Smoker  . Smokeless tobacco: Never Used  Substance and Sexual Activity  . Alcohol use: Not Currently  . Drug use: No  . Sexual activity: Yes

## 2018-11-14 MED FILL — ?ATORVASTATIN 40MG TABLET: 40 | 30 days supply | Qty: 30 | Fill #4

## 2018-11-14 MED FILL — TRUEplus LANCETS 28G MISC: 50 days supply | Qty: 100 | Fill #3

## 2018-11-21 ENCOUNTER — Ambulatory Visit: Payer: Self-pay | Attending: Nurse Practitioner | Admitting: Nurse Practitioner

## 2018-11-21 ENCOUNTER — Other Ambulatory Visit: Payer: Self-pay

## 2018-11-21 ENCOUNTER — Ambulatory Visit: Payer: Medicaid Other | Admitting: Pharmacist

## 2018-11-21 ENCOUNTER — Encounter: Payer: Self-pay | Admitting: Nurse Practitioner

## 2018-11-21 VITALS — BP 134/87 | HR 89 | Temp 98.4°F | Resp 16 | Wt 219.4 lb

## 2018-11-21 DIAGNOSIS — M1A09X0 Idiopathic chronic gout, multiple sites, without tophus (tophi): Secondary | ICD-10-CM

## 2018-11-21 DIAGNOSIS — I10 Essential (primary) hypertension: Secondary | ICD-10-CM

## 2018-11-21 DIAGNOSIS — Z23 Encounter for immunization: Secondary | ICD-10-CM

## 2018-11-21 DIAGNOSIS — Z1211 Encounter for screening for malignant neoplasm of colon: Secondary | ICD-10-CM

## 2018-11-21 DIAGNOSIS — E785 Hyperlipidemia, unspecified: Secondary | ICD-10-CM

## 2018-11-21 DIAGNOSIS — K219 Gastro-esophageal reflux disease without esophagitis: Secondary | ICD-10-CM

## 2018-11-21 DIAGNOSIS — E119 Type 2 diabetes mellitus without complications: Secondary | ICD-10-CM

## 2018-11-21 DIAGNOSIS — E118 Type 2 diabetes mellitus with unspecified complications: Secondary | ICD-10-CM

## 2018-11-21 LAB — POCT GLYCOSYLATED HEMOGLOBIN (HGB A1C): HbA1c POC (<> result, manual entry): 5.5 % (ref 4.0–5.6)

## 2018-11-21 LAB — GLUCOSE, POCT (MANUAL RESULT ENTRY): POC Glucose: 121 mg/dl — AB (ref 70–99)

## 2018-11-21 MED ORDER — OMEPRAZOLE MAGNESIUM 20 MG PO TBEC: 20.0000 mg | DELAYED_RELEASE_TABLET | Freq: Every day | ORAL | 1 refills | Status: DC

## 2018-11-21 MED ORDER — ALLOPURINOL 100 MG PO TABS: 200.0000 mg | ORAL_TABLET | Freq: Every day | ORAL | 1 refills | Status: DC

## 2018-11-21 MED ORDER — TRUE METRIX BLOOD GLUCOSE TEST VI STRP: ORAL_STRIP | 11 refills | Status: DC

## 2018-11-21 MED ORDER — OMEPRAZOLE 20 MG PO CPDR: 20.0000 mg | DELAYED_RELEASE_CAPSULE | Freq: Every day | ORAL | 2 refills | Status: DC

## 2018-11-21 MED ORDER — LISINOPRIL-HYDROCHLOROTHIAZIDE 10-12.5 MG PO TABS: 1.0000 | ORAL_TABLET | Freq: Every day | ORAL | 3 refills | Status: DC

## 2018-11-21 MED FILL — ?ALLOPURINOL 100MG TABLET: 100 | 30 days supply | Qty: 60 | Fill #1

## 2018-11-21 MED FILL — TRUE METRIX TEST STRIP: 50 days supply | Qty: 100 | Fill #8

## 2018-11-21 MED FILL — LISINOPRIL-HCTZ 10-12.5 MG: 10-12.5 | 30 days supply | Qty: 30 | Fill #11

## 2018-11-21 MED FILL — ?OMEPRAZOLE 20MG CAP DR: 20 | 30 days supply | Qty: 30 | Fill #0

## 2018-11-21 NOTE — Progress Notes (Signed)
Pt states he is having pain in his left hip and b/l knee

## 2018-11-21 NOTE — Progress Notes (Signed)
Assessment & Plan:  Christopher Hurst was seen today for diabetes and hypertension.  Diagnoses and all orders for this visit:  Controlled type 2 diabetes mellitus with complication, without long-term current use of insulin (HCC) -     POCT glucose (manual entry) -     POCT glycosylated hemoglobin (Hb A1C) -     Microalbumin / creatinine urine ratio -     glucose blood (TRUE METRIX BLOOD GLUCOSE TEST) test strip; Use as instructed. Check blood glucose levels 2 times per day -     CMP14+EGFR Continue blood sugar control as discussed in office today, low carbohydrate diet, and regular physical exercise as tolerated, 150 minutes per week (30 min each day, 5 days per week, or 50 min 3 days per week). Keep blood sugar logs with fasting goal of 90-130 mg/dl, post prandial (after you eat) less than 180.  For Hypoglycemia: BS <60 and Hyperglycemia BS >400; contact the clinic ASAP. Annual eye exams and foot exams are recommended.   Essential hypertension -     lisinopril-hydrochlorothiazide (ZESTORETIC) 10-12.5 MG tablet; Take 1 tablet by mouth daily. Continue all antihypertensives as prescribed.  Remember to bring in your blood pressure log with you for your follow up appointment.  DASH/Mediterranean Diets are healthier choices for HTN.    Gastroesophageal reflux disease Chronic and well controlled.  He prefers tablets over capsules due to difficulty swallowing capsules -     omeprazole (PRILOSEC OTC) 20 MG tablet; Take 1 tablet (20 mg total) by mouth daily. INSTRUCTIONS: Avoid GERD Triggers: acidic, spicy or fried foods, caffeine, coffee, sodas,  alcohol and chocolate.   Chronic gout of multiple sites, unspecified cause -     allopurinol (ZYLOPRIM) 100 MG tablet; Take 2 tablets (200 mg total) by mouth daily. Lab Results  Component Value Date   LABURIC 4.5 05/24/2018   Colon cancer screening -     Fecal occult blood, imunochemical  Dyslipidemia, goal LDL below 70 -     Lipid panel  INSTRUCTIONS: Work on a low fat, heart healthy diet and participate in regular aerobic exercise program by working out at least 150 minutes per week; 5 days a week-30 minutes per day. Avoid red meat/beef/steak,  fried foods. junk foods, sodas, sugary drinks, unhealthy snacking, alcohol and smoking.  Drink at least 80 oz of water per day and monitor your carbohydrate intake daily.      Patient has been counseled on age-appropriate routine health concerns for screening and prevention. These are reviewed and up-to-date. Referrals have been placed accordingly. Immunizations are up-to-date or declined.    Subjective:   Chief Complaint  Patient presents with  . Diabetes  . Hypertension   HPI Christopher Hurst. 53 y.o. male presents to office today for f/u.  has a past medical history of Acid reflux, Arthritis, Diabetes mellitus without complication ([place]), Gout, High cholesterol, Hypertension, Obesity, and Pneumonia.   DM TYPE 2 Well controlled. He is eating healthier, walking more despite his arthralgias and endorses medication compliance taking metformin 500 mg twice daily.  He denies any symptoms of hypo or hyperglycemia.  Currently taking ACE and statin Lab Results  Component Value Date   HGBA1C 5.5 11/21/2018    Hyperlipidemia Patient presents for follow up to hyperlipidemia.  He is medication compliant taking atorvastatin 40 mg daily however LDL is not at goal of less than 70. He is diet compliant and denies statin intolerance including myalgias.  Lab Results  Component Value Date   CHOL  185 11/21/2018   Lab Results  Component Value Date   HDL 41 11/21/2018   Lab Results  Component Value Date   LDLCALC 117 (H) 11/21/2018   Lab Results  Component Value Date   TRIG 154 (H) 11/21/2018   Lab Results  Component Value Date   CHOLHDL 4.5 11/21/2018   CHRONIC HYPERTENSION Disease Monitoring  Blood pressure range BP Readings from Last 3 Encounters:  11/21/18 134/87  06/03/18  127/82  05/01/18 131/90  Walking 1.5 was a day.  Has lost over 30 pounds over the past year.  Chest pain: no   Dyspnea: no   Claudication: no  Medication compliance: yes, taking lisinopril-hydrochlorothiazide 10-12.5 mg daily Medication Side Effects  Lightheadedness: no   Urinary frequency: no   Edema: no   Impotence: no  Preventitive Healthcare:  Exercise: yes   Diet Pattern: salt not added to cooking  Salt Restriction:  yes  Review of Systems  Constitutional: Negative for fever, malaise/fatigue and weight loss.  HENT: Negative.  Negative for nosebleeds.   Eyes: Negative.  Negative for blurred vision, double vision and photophobia.  Respiratory: Negative.  Negative for cough and shortness of breath.   Cardiovascular: Negative.  Negative for chest pain, palpitations and leg swelling.  Gastrointestinal: Positive for heartburn. Negative for nausea and vomiting.  Musculoskeletal: Positive for joint pain and myalgias.  Neurological: Negative.  Negative for dizziness, focal weakness, seizures and headaches.  Psychiatric/Behavioral: Negative.  Negative for suicidal ideas.    Past Medical History:  Diagnosis Date  . Acid reflux   . Arthritis   . Diabetes mellitus without complication ([place])   . Gout   . High cholesterol   . Hypertension   . Obesity   . Pneumonia     Past Surgical History:  Procedure Laterality Date  . ANTERIOR CERVICAL DECOMP/DISCECTOMY FUSION N/A 03/17/2018   Procedure: CERVICAL FOUR-FIVE, CERVCAL FIVE-SIX ANTERIOR CERVICAL DECOMPRESSION/DISCECTOMY FUSION, ALLOGRAFT PLATE;  Surgeon: [name], MD;  Location: [facility];  Service: Orthopedics;  Laterality: N/A;  . CYSTECTOMY     testicle   . KNEE ARTHROSCOPY     left two knee surgery   . SHOULDER ARTHROSCOPY     one left shoulder   . TONSILLECTOMY      Family History  Problem Relation Age of Onset  . Cirrhosis Father   . Stroke Father   . Hypertension Brother     Social History Reviewed with no  changes to be made today.   Outpatient Medications Prior to Visit  Medication Sig Dispense Refill  . atorvastatin (LIPITOR) 40 MG tablet Take 1 tablet (40 mg total) by mouth daily. 90 tablet 3  . Blood Glucose Monitoring Suppl (TRUE METRIX METER) w/Device KIT Use as instructed. Check blood glucose levels 2 times per day 1 kit 0  . metFORMIN (GLUCOPHAGE) 500 MG tablet Take 1 tablet (500 mg total) by mouth 2 (two) times daily with a meal. 180 tablet 3  . TRUEPLUS LANCETS 28G MISC Use as instructed. Check blood glucose levels 2 times per day 100 each 3  . allopurinol (ZYLOPRIM) 100 MG tablet Take 2 tablets (200 mg total) by mouth daily. 180 tablet 1  . glucose blood (TRUE METRIX BLOOD GLUCOSE TEST) test strip Use as instructed. Check blood glucose levels 2 times per day 100 each 11  . lisinopril-hydrochlorothiazide (ZESTORETIC) 10-12.5 MG tablet Take 1 tablet by mouth daily. 90 tablet 3  . omeprazole (PRILOSEC) 20 MG capsule Take 1 capsule (20 mg total)  by mouth daily. 90 capsule 2   No facility-administered medications prior to visit.     No Known Allergies     Objective:    BP 134/87   Pulse 89   Temp 98.4 F (36.9 C) (Oral)   Resp 16   Wt 219 lb 6.4 oz (99.5 kg)   SpO2 99%   BMI 33.36 kg/m  Wt Readings from Last 3 Encounters:  11/21/18 219 lb 6.4 oz (99.5 kg)  06/03/18 218 lb (98.9 kg)  05/30/18 218 lb (98.9 kg)    Physical Exam Vitals signs and nursing note reviewed.  Constitutional:      Appearance: He is well-developed.  HENT:     Head: Normocephalic and atraumatic.  Neck:     Musculoskeletal: Normal range of motion.  Cardiovascular:     Rate and Rhythm: Normal rate and regular rhythm.     Heart sounds: Normal heart sounds. No murmur. No friction rub. No gallop.   Pulmonary:     Effort: Pulmonary effort is normal. No tachypnea or respiratory distress.     Breath sounds: Normal breath sounds. No decreased breath sounds, wheezing, rhonchi or rales.  Chest:      Chest wall: No tenderness.  Abdominal:     General: Bowel sounds are normal.     Palpations: Abdomen is soft.  Musculoskeletal: Normal range of motion.  Skin:    General: Skin is warm and dry.  Neurological:     Mental Status: He is alert and oriented to person, place, and time.     Coordination: Coordination normal.     Comments: Uses cane as assistive device for walking  Psychiatric:        Behavior: Behavior normal. Behavior is cooperative.        Thought Content: Thought content normal.        Judgment: Judgment normal.          Patient has been counseled extensively about nutrition and exercise as well as the importance of adherence with medications and regular follow-up. The patient was given clear instructions to go to ER or return to medical center if symptoms don't improve, worsen or new problems develop. The patient verbalized understanding.   Follow-up: Return in about 3 months (around 02/21/2019).   Gildardo Pounds, FNP-BC [facility], [place]   11/22/2018, 10:45 PM

## 2018-11-21 NOTE — Patient Instructions (Signed)
Influenza Virus Vaccine injection (Fluarix) What is this medicine? INFLUENZA VIRUS VACCINE (in floo EN zuh VAHY ruhs vak SEEN) helps to reduce the risk of getting influenza also known as the flu. This medicine may be used for other purposes; ask your health care provider or pharmacist if you have questions. COMMON BRAND NAME(S): Fluarix, Fluzone What should I tell my health care provider before I take this medicine? They need to know if you have any of these conditions:  bleeding disorder like hemophilia  fever or infection  Guillain-Barre syndrome or other neurological problems  immune system problems  infection with the human immunodeficiency virus (HIV) or AIDS  low blood platelet counts  multiple sclerosis  an unusual or allergic reaction to influenza virus vaccine, eggs, chicken proteins, latex, gentamicin, other medicines, foods, dyes or preservatives  pregnant or trying to get pregnant  breast-feeding How should I use this medicine? This vaccine is for injection into a muscle. It is given by a health care professional. A copy of Vaccine Information Statements will be given before each vaccination. Read this sheet carefully each time. The sheet may change frequently. Talk to your pediatrician regarding the use of this medicine in children. Special care may be needed. Overdosage: If you think you have taken too much of this medicine contact a poison control center or emergency room at once. NOTE: This medicine is only for you. Do not share this medicine with others. What if I miss a dose? This does not apply. What may interact with this medicine?  chemotherapy or radiation therapy  medicines that lower your immune system like etanercept, anakinra, infliximab, and adalimumab  medicines that treat or prevent blood clots like warfarin  phenytoin  steroid medicines like prednisone or cortisone  theophylline  vaccines This list may not describe all possible  interactions. Give your health care provider a list of all the medicines, herbs, non-prescription drugs, or dietary supplements you use. Also tell them if you smoke, drink alcohol, or use illegal drugs. Some items may interact with your medicine. What should I watch for while using this medicine? Report any side effects that do not go away within 3 days to your doctor or health care professional. Call your health care provider if any unusual symptoms occur within 6 weeks of receiving this vaccine. You may still catch the flu, but the illness is not usually as bad. You cannot get the flu from the vaccine. The vaccine will not protect against colds or other illnesses that may cause fever. The vaccine is needed every year. What side effects may I notice from receiving this medicine? Side effects that you should report to your doctor or health care professional as soon as possible:  allergic reactions like skin rash, itching or hives, swelling of the face, lips, or tongue Side effects that usually do not require medical attention (report to your doctor or health care professional if they continue or are bothersome):  fever  headache  muscle aches and pains  pain, tenderness, redness, or swelling at site where injected  weak or tired This list may not describe all possible side effects. Call your doctor for medical advice about side effects. You may report side effects to FDA at 1-800-FDA-1088. Where should I keep my medicine? This vaccine is only given in a clinic, pharmacy, doctor's office, or other health care setting and will not be stored at home. NOTE: This sheet is a summary. It may not cover all possible information. If you have questions   about this medicine, talk to your doctor, pharmacist, or health care provider.  2020 Elsevier/Gold Standard (2007-08-02 09:30:40)  

## 2018-11-21 NOTE — Progress Notes (Signed)
Patient presents for vaccination against influenza per orders of Zelda. Consent given. Counseling provided. No contraindications exists. Vaccine administered without incident.   

## 2018-11-22 ENCOUNTER — Encounter: Payer: Self-pay | Admitting: Nurse Practitioner

## 2018-11-22 LAB — CMP14+EGFR
ALT: 13 IU/L (ref 0–44)
AST: 14 IU/L (ref 0–40)
Albumin/Globulin Ratio: 1.9 (ref 1.2–2.2)
Albumin: 4.6 g/dL (ref 3.8–4.9)
Alkaline Phosphatase: 109 IU/L (ref 39–117)
BUN/Creatinine Ratio: 13 (ref 9–20)
BUN: 10 mg/dL (ref 6–24)
Bilirubin Total: 0.2 mg/dL (ref 0.0–1.2)
CO2: 26 mmol/L (ref 20–29)
Calcium: 9.8 mg/dL (ref 8.7–10.2)
Chloride: 102 mmol/L (ref 96–106)
Creatinine, Ser: 0.79 mg/dL (ref 0.76–1.27)
GFR calc Af Amer: 119 mL/min/{1.73_m2} (ref >59)
GFR calc non Af Amer: 103 mL/min/{1.73_m2} (ref >59)
Globulin, Total: 2.4 g/dL (ref 1.5–4.5)
Glucose: 94 mg/dL (ref 65–99)
Potassium: 4.2 mmol/L (ref 3.5–5.2)
Sodium: 144 mmol/L (ref 134–144)
Total Protein: 7 g/dL (ref 6.0–8.5)

## 2018-11-22 LAB — MICROALBUMIN / CREATININE URINE RATIO
Creatinine, Urine: 46.7 mg/dL
Microalb/Creat Ratio: <6 mg/g creat (ref 0–29)
Microalbumin, Urine: <3 ug/mL

## 2018-11-22 LAB — LIPID PANEL
Chol/HDL Ratio: 4.5 ratio (ref 0.0–5.0)
Cholesterol, Total: 185 mg/dL (ref 100–199)
HDL: 41 mg/dL (ref >39)
LDL Chol Calc (NIH): 117 mg/dL — ABNORMAL HIGH (ref 0–99)
Triglycerides: 154 mg/dL — ABNORMAL HIGH (ref 0–149)
VLDL Cholesterol Cal: 27 mg/dL (ref 5–40)

## 2018-11-24 ENCOUNTER — Telehealth: Payer: Self-pay | Admitting: Nurse Practitioner

## 2018-11-24 NOTE — Telephone Encounter (Signed)
Patient called back for lab

## 2018-11-27 ENCOUNTER — Telehealth: Payer: Self-pay

## 2018-11-27 NOTE — Telephone Encounter (Signed)
Patient returned call regarding his lab results. Please f/u  °

## 2018-11-27 NOTE — Telephone Encounter (Signed)
Returned pt call and went over lab results pt doesn't have any questions or concerns  °

## 2018-11-27 NOTE — Telephone Encounter (Signed)
Contacted pt on 11/6 to go over lab results pt didn't answer lvm asking pt to give me a call at his earliest convenience  

## 2018-12-05 ENCOUNTER — Ambulatory Visit: Payer: Medicaid Other | Admitting: Orthopaedic Surgery

## 2018-12-18 MED FILL — ?ATORVASTATIN 40MG TABLET: 40 | 30 days supply | Qty: 30 | Fill #5

## 2018-12-18 MED FILL — LISINOPRIL-HCTZ 10-12.5 MG: 10-12.5 | 30 days supply | Qty: 30 | Fill #0

## 2018-12-18 MED FILL — ?ALLOPURINOL 100MG TABLET: 100 | 30 days supply | Qty: 60 | Fill #2

## 2018-12-18 MED FILL — ?OMEPRAZOLE 20MG CAP DR: 20 | 30 days supply | Qty: 30 | Fill #1

## 2018-12-18 MED FILL — ?METFORMIN HCL 500MG TABLET: 500 | 30 days supply | Qty: 60 | Fill #3

## 2018-12-27 ENCOUNTER — Ambulatory Visit: Payer: Medicaid Other

## 2019-01-08 ENCOUNTER — Ambulatory Visit: Payer: Medicaid Other

## 2019-01-16 ENCOUNTER — Other Ambulatory Visit: Payer: Self-pay | Admitting: Nurse Practitioner

## 2019-01-16 DIAGNOSIS — E118 Type 2 diabetes mellitus with unspecified complications: Secondary | ICD-10-CM

## 2019-01-16 MED FILL — ?ATORVASTATIN 40MG TABLET: 40 | 30 days supply | Qty: 30 | Fill #6

## 2019-01-16 MED FILL — TRUEplus LANCETS 28G MISC: 50 days supply | Qty: 100 | Fill #0

## 2019-01-16 MED FILL — ?METFORMIN HCL 500MG TABLET: 500 | 30 days supply | Qty: 60 | Fill #4

## 2019-01-16 MED FILL — ?ALLOPURINOL 100MG TABLET: 100 | 30 days supply | Qty: 60 | Fill #3

## 2019-01-16 MED FILL — TRUE METRIX GLUCOSE TEST ST: 50 days supply | Qty: 100 | Fill #0

## 2019-01-16 MED FILL — ?OMEPRAZOLE 20MG CAP DR: 20 | 30 days supply | Qty: 30 | Fill #2

## 2019-01-16 MED FILL — LISINOPRIL-HCTZ 10-12.5 MG: 10-12.5 | 30 days supply | Qty: 30 | Fill #1

## 2019-01-26 ENCOUNTER — Ambulatory Visit: Payer: Medicaid Other

## 2019-01-26 LAB — FECAL OCCULT BLOOD, IMMUNOCHEMICAL

## 2019-02-14 ENCOUNTER — Ambulatory Visit: Payer: Medicaid Other

## 2019-02-16 MED FILL — ?ATORVASTATIN 40MG TABL: 40 | 30 days supply | Qty: 30 | Fill #7

## 2019-02-16 MED FILL — ?ALLOPURINOL 100MG TABLET: 100 | 30 days supply | Qty: 60 | Fill #0

## 2019-02-16 MED FILL — LISINOPRIL-HCTZ 10-12.5 MG: 10-12.5 | 30 days supply | Qty: 30 | Fill #2

## 2019-02-16 MED FILL — ?METFORMIN HCL 500MG TABLET: 500 | 30 days supply | Qty: 60 | Fill #5

## 2019-02-21 ENCOUNTER — Ambulatory Visit: Payer: Self-pay | Attending: Nurse Practitioner | Admitting: Nurse Practitioner

## 2019-02-21 ENCOUNTER — Other Ambulatory Visit: Payer: Self-pay

## 2019-02-21 ENCOUNTER — Encounter: Payer: Self-pay | Admitting: Nurse Practitioner

## 2019-02-21 DIAGNOSIS — E1165 Type 2 diabetes mellitus with hyperglycemia: Secondary | ICD-10-CM

## 2019-02-21 DIAGNOSIS — I10 Essential (primary) hypertension: Secondary | ICD-10-CM

## 2019-02-21 DIAGNOSIS — K219 Gastro-esophageal reflux disease without esophagitis: Secondary | ICD-10-CM

## 2019-02-21 DIAGNOSIS — E785 Hyperlipidemia, unspecified: Secondary | ICD-10-CM

## 2019-02-24 ENCOUNTER — Other Ambulatory Visit: Payer: Self-pay | Admitting: Nurse Practitioner

## 2019-02-24 ENCOUNTER — Encounter: Payer: Self-pay | Admitting: Nurse Practitioner

## 2019-02-24 DIAGNOSIS — E785 Hyperlipidemia, unspecified: Secondary | ICD-10-CM

## 2019-02-24 DIAGNOSIS — E1165 Type 2 diabetes mellitus with hyperglycemia: Secondary | ICD-10-CM

## 2019-02-24 DIAGNOSIS — I10 Essential (primary) hypertension: Secondary | ICD-10-CM

## 2019-02-24 MED ORDER — TRUEPLUS LANCETS 28G MISC: 3 refills | Status: AC

## 2019-02-24 MED ORDER — METFORMIN HCL 500 MG PO TABS: 500.0000 mg | ORAL_TABLET | Freq: Two times a day (BID) | ORAL | 1 refills | Status: DC

## 2019-02-24 MED ORDER — LISINOPRIL-HYDROCHLOROTHIAZIDE 10-12.5 MG PO TABS: 1.0000 | ORAL_TABLET | Freq: Every day | ORAL | 1 refills | Status: DC

## 2019-02-24 MED ORDER — OMEPRAZOLE MAGNESIUM 20 MG PO TBEC: 20.0000 mg | DELAYED_RELEASE_TABLET | Freq: Every day | ORAL | 1 refills | Status: DC

## 2019-02-24 MED ORDER — TRUE METRIX BLOOD GLUCOSE TEST VI STRP: ORAL_STRIP | 11 refills | Status: AC

## 2019-02-24 MED ORDER — ATORVASTATIN CALCIUM 40 MG PO TABS: 40.0000 mg | ORAL_TABLET | Freq: Every day | ORAL | 3 refills | Status: DC

## 2019-02-24 NOTE — Progress Notes (Signed)
Virtual Visit via Telephone Note Due to national recommendations of social distancing due to COVID 19, telehealth visit is felt to be most appropriate for this patient at this time.  I discussed the limitations, risks, security and privacy concerns of performing an evaluation and management service by telephone and the availability of in person appointments. I also discussed with the patient that there may be a patient responsible charge related to this service. The patient expressed understanding and agreed to proceed.    I connected with Christopher Hurst. on 02/21/19  at  10:30 AM EST  EDT by telephone and verified that I am speaking with the correct person using two identifiers.   Consent I discussed the limitations, risks, security and privacy concerns of performing an evaluation and management service by telephone and the availability of in person appointments. I also discussed with the patient that there may be a patient responsible charge related to this service. The patient expressed understanding and agreed to proceed.   Location of Patient: Private Residence   Location of Provider: [facility]    Persons participating in Telemedicine visit: Christopher Denver FNP-BC YY Worthington CMA Neema Elpidio Galea.    History of Present Illness: Telemedicine visit for: F/U  has a past medical history of Acid reflux, Arthritis, Diabetes mellitus without complication (HCC), Gout, High cholesterol, Hypertension, Obesity, and Pneumonia.  Essential Hypertension Blood pressure is well controlled in the office and at home. He is losing weight by working out (weightlifting) and walking several times per day.  Taking zestoretic 10-12.5 mg daily . Denies chest pain, shortness of breath, palpitations, lightheadedness, dizziness, headaches or BLE edema. Last recorded weight 217lbs. Most recent recorded blood pressure: 107/70.  BP Readings from Last 3 Encounters:  11/21/18 134/87   06/03/18 127/82  05/01/18 131/90    DM TYPE 2 Taking metformin 500 mg BID. Denies any hypo or hyperglycemic symptoms.  checking blood glucose levels twice per day with average readings  70-100 fasting. He is taking an ACE and STATIN. May be able to reduce metformin at next office visit.  Lab Results  Component Value Date   HGBA1C 5.5 11/21/2018   Dyslipidemia LDL not at goal <70. Taking atorvastatin 70 mg daily .  Denies statin intolerance or myalgias. He is maintaining a low fat, low sodium, low carb diet.  Lab Results  Component Value Date   LDLCALC 117 (H) 11/21/2018    Past Medical History:  Diagnosis Date  . Acid reflux   . Arthritis   . Diabetes mellitus without complication (HCC)   . Gout   . High cholesterol   . Hypertension   . Obesity   . Pneumonia     Past Surgical History:  Procedure Laterality Date  . ANTERIOR CERVICAL DECOMP/DISCECTOMY FUSION N/A 03/17/2018   Procedure: CERVICAL FOUR-FIVE, CERVCAL FIVE-SIX ANTERIOR CERVICAL DECOMPRESSION/DISCECTOMY FUSION, ALLOGRAFT PLATE;  Surgeon: Eldred Manges, MD;  Location: [facility];  Service: Orthopedics;  Laterality: N/A;  . CYSTECTOMY     testicle   . KNEE ARTHROSCOPY     left two knee surgery   . SHOULDER ARTHROSCOPY     one left shoulder   . TONSILLECTOMY      Family History  Problem Relation Age of Onset  . Cirrhosis Father   . Stroke Father   . Hypertension Brother     Social History   Socioeconomic History  . Marital status: Married    Spouse name: Not on file  . Number  of children: Not on file  . Years of education: Not on file  . Highest education level: Not on file  Occupational History  . Not on file  Tobacco Use  . Smoking status: Never Smoker  . Smokeless tobacco: Never Used  Substance and Sexual Activity  . Alcohol use: Not Currently  . Drug use: No  . Sexual activity: Yes  Other Topics Concern  . Not on file  Social History Narrative  . Not on file   Social Determinants of Health    Financial Resource Strain:   . Difficulty of Paying Living Expenses: Not on file  Food Insecurity:   . Worried About Charity fundraiser in the Last Year: Not on file  . Ran Out of Food in the Last Year: Not on file  Transportation Needs:   . Lack of Transportation (Medical): Not on file  . Lack of Transportation (Non-Medical): Not on file  Physical Activity:   . Days of Exercise per Week: Not on file  . Minutes of Exercise per Session: Not on file  Stress:   . Feeling of Stress : Not on file  Social Connections:   . Frequency of Communication with Friends and Family: Not on file  . Frequency of Social Gatherings with Friends and Family: Not on file  . Attends Religious Services: Not on file  . Active Member of Clubs or Organizations: Not on file  . Attends Archivist Meetings: Not on file  . Marital Status: Not on file     Observations/Objective: Awake, alert and oriented x 3   Review of Systems  Constitutional: Negative for fever, malaise/fatigue and weight loss.  HENT: Negative.  Negative for nosebleeds.   Eyes: Negative.  Negative for blurred vision, double vision and photophobia.  Respiratory: Negative.  Negative for cough and shortness of breath.   Cardiovascular: Negative.  Negative for chest pain, palpitations and leg swelling.  Gastrointestinal: Negative.  Negative for heartburn, nausea and vomiting.  Musculoskeletal: Negative.  Negative for myalgias.  Neurological: Negative.  Negative for dizziness, focal weakness, seizures and headaches.  Psychiatric/Behavioral: Negative.  Negative for suicidal ideas.    Assessment and Plan: Jacky was seen today for follow-up.  Diagnoses and all orders for this visit:  Controlled type 2 diabetes mellitus with hyperglycemia, without long-term current use of insulin (HCC) -     metFORMIN (GLUCOPHAGE) 500 MG tablet; Take 1 tablet (500 mg total) by mouth 2 (two) times daily with a meal. -     glucose blood (TRUE  METRIX BLOOD GLUCOSE TEST) test strip; Use as instructed. Check blood glucose levels 2 times per day E11.65 -     TRUEplus Lancets 28G MISC; Use as instructed. Check blood glucose level by fingerstick twice per day. E11.65 Continue blood sugar control as discussed in office today, low carbohydrate diet, and regular physical exercise as tolerated, 150 minutes per week (30 min each day, 5 days per week, or 50 min 3 days per week). Keep blood sugar logs with fasting goal of 90-130 mg/dl, post prandial (after you eat) less than 180.  For Hypoglycemia: BS <60 and Hyperglycemia BS >400; contact the clinic ASAP. Annual eye exams and foot exams are recommended.   Essential hypertension -     lisinopril-hydrochlorothiazide (ZESTORETIC) 10-12.5 MG tablet; Take 1 tablet by mouth daily. Continue all antihypertensives as prescribed.  Remember to bring in your blood pressure log with you for your follow up appointment.  DASH/Mediterranean Diets are healthier choices for  HTN.    Dyslipidemia, goal LDL below 70 -     atorvastatin (LIPITOR) 40 MG tablet; Take 1 tablet (40 mg total) by mouth daily. INSTRUCTIONS: Work on a low fat, heart healthy diet and participate in regular aerobic exercise program by working out at least 150 minutes per week; 5 days a week-30 minutes per day. Avoid red meat/beef/steak,  fried foods. junk foods, sodas, sugary drinks, unhealthy snacking, alcohol and smoking.  Drink at least 80 oz of water per day and monitor your carbohydrate intake daily.   Gastroesophageal reflux disease without esophagitis -     omeprazole (PRILOSEC OTC) 20 MG tablet; Take 1 tablet (20 mg total) by mouth daily. INSTRUCTIONS: Avoid GERD Triggers: acidic, spicy or fried foods, caffeine, coffee, sodas,  alcohol and chocolate.     Follow Up Instructions Return in about 3 months (around 05/21/2019).     I discussed the assessment and treatment plan with the patient. The patient was provided an opportunity to  ask questions and all were answered. The patient agreed with the plan and demonstrated an understanding of the instructions.   The patient was advised to call back or seek an in-person evaluation if the symptoms worsen or if the condition fails to improve as anticipated.  I provided 19 minutes of non-face-to-face time during this encounter including median intraservice time, reviewing previous notes, labs, imaging, medications and explaining diagnosis and management.  Claiborne Rigg, FNP-BC

## 2019-02-26 MED FILL — OMEPRAZOLE 20 MG CAP: 20 | 30 days supply | Qty: 30 | Fill #0

## 2019-02-26 MED FILL — TRUEplus LANCETS 28G MISC: 90 days supply | Qty: 100 | Fill #0

## 2019-02-26 MED FILL — TRUE METRIX TEST STRIP: 50 days supply | Qty: 100 | Fill #0

## 2019-02-27 ENCOUNTER — Other Ambulatory Visit: Payer: Self-pay

## 2019-02-27 ENCOUNTER — Ambulatory Visit: Payer: Self-pay | Attending: Nurse Practitioner

## 2019-02-27 DIAGNOSIS — E785 Hyperlipidemia, unspecified: Secondary | ICD-10-CM

## 2019-02-27 DIAGNOSIS — I10 Essential (primary) hypertension: Secondary | ICD-10-CM

## 2019-02-27 DIAGNOSIS — E1165 Type 2 diabetes mellitus with hyperglycemia: Secondary | ICD-10-CM

## 2019-02-28 LAB — LIPID PANEL
Chol/HDL Ratio: 3.4 ratio (ref 0.0–5.0)
Cholesterol, Total: 125 mg/dL (ref 100–199)
HDL: 37 mg/dL — ABNORMAL LOW (ref >39)
LDL Chol Calc (NIH): 71 mg/dL (ref 0–99)
Triglycerides: 89 mg/dL (ref 0–149)
VLDL Cholesterol Cal: 17 mg/dL (ref 5–40)

## 2019-02-28 LAB — CMP14+EGFR
ALT: 10 IU/L (ref 0–44)
AST: 9 IU/L (ref 0–40)
Albumin/Globulin Ratio: 1.9 (ref 1.2–2.2)
Albumin: 4.2 g/dL (ref 3.8–4.9)
Alkaline Phosphatase: 100 IU/L (ref 39–117)
BUN/Creatinine Ratio: 16 (ref 9–20)
BUN: 13 mg/dL (ref 6–24)
Bilirubin Total: 0.3 mg/dL (ref 0.0–1.2)
CO2: 27 mmol/L (ref 20–29)
Calcium: 9.9 mg/dL (ref 8.7–10.2)
Chloride: 102 mmol/L (ref 96–106)
Creatinine, Ser: 0.81 mg/dL (ref 0.76–1.27)
GFR calc Af Amer: 116 mL/min/{1.73_m2} (ref >59)
GFR calc non Af Amer: 101 mL/min/{1.73_m2} (ref >59)
Globulin, Total: 2.2 g/dL (ref 1.5–4.5)
Glucose: 91 mg/dL (ref 65–99)
Potassium: 4.3 mmol/L (ref 3.5–5.2)
Sodium: 144 mmol/L (ref 134–144)
Total Protein: 6.4 g/dL (ref 6.0–8.5)

## 2019-02-28 LAB — HEMOGLOBIN A1C
Est. average glucose Bld gHb Est-mCnc: 117 mg/dL
Hgb A1c MFr Bld: 5.7 % — ABNORMAL HIGH (ref 4.8–5.6)

## 2019-03-05 ENCOUNTER — Telehealth: Payer: Self-pay

## 2019-03-05 ENCOUNTER — Encounter: Payer: Self-pay | Admitting: Nurse Practitioner

## 2019-03-05 NOTE — Telephone Encounter (Signed)
Results were discussed today with patient as instructed by PCP. Verbalized understanding  "Cholesterol levels have improved significantly however will still continue cholesterol medication at this time. Kidney, liver function and electrolytes are all normal".

## 2019-03-19 ENCOUNTER — Ambulatory Visit: Payer: Medicaid Other

## 2019-03-19 ENCOUNTER — Other Ambulatory Visit: Payer: Self-pay

## 2019-03-19 MED FILL — ?OMEPRAZOLE 20MG CAP DR: 20 | 30 days supply | Qty: 30 | Fill #0

## 2019-03-19 MED FILL — ?ALLOPURINOL 100MG TABLET: 100 | 30 days supply | Qty: 60 | Fill #1

## 2019-03-19 MED FILL — LISINOPRIL-HCTZ 10-12.5 MG: 10-12.5 | 30 days supply | Qty: 30 | Fill #3

## 2019-03-19 MED FILL — ?METFORMIN HCL 500MG TABLET: 500 | 30 days supply | Qty: 60 | Fill #0

## 2019-03-19 MED FILL — ?ATORVASTATIN 40MG TABLET: 40 | 30 days supply | Qty: 30 | Fill #0

## 2019-03-19 MED FILL — TRUE METRIX TEST STRIP: 50 days supply | Qty: 100 | Fill #0

## 2019-03-25 ENCOUNTER — Other Ambulatory Visit: Payer: Self-pay | Admitting: Nurse Practitioner

## 2019-03-25 ENCOUNTER — Encounter: Payer: Self-pay | Admitting: Nurse Practitioner

## 2019-03-25 MED ORDER — SILDENAFIL CITRATE 100 MG PO TABS: 50.0000 mg | ORAL_TABLET | Freq: Every day | ORAL | 11 refills | Status: DC | PRN

## 2019-03-26 MED FILL — SILDENAFIL CITRATE 100 MG T: 100 | 5 days supply | Qty: 5 | Fill #0

## 2019-04-23 MED FILL — ?ATORVASTATIN 40MG TABLET: 40 | 30 days supply | Qty: 30 | Fill #1

## 2019-04-23 MED FILL — TRUEplus LANCETS 28G MISC: 50 days supply | Qty: 100 | Fill #0

## 2019-04-23 MED FILL — ?METFORMIN HCL 500MG TABLET: 500 | 30 days supply | Qty: 60 | Fill #1

## 2019-04-23 MED FILL — ?ALLOPURINOL 100MG TABLET: 100 | 30 days supply | Qty: 60 | Fill #2

## 2019-04-23 MED FILL — SILDENAFIL CITRATE 100 MG T: 100 | 15 days supply | Qty: 5 | Fill #0

## 2019-04-23 MED FILL — LISINOPRIL-HCTZ 10-12.5 MG: 10-12.5 | 30 days supply | Qty: 30 | Fill #4

## 2019-04-27 ENCOUNTER — Encounter: Payer: Self-pay | Admitting: Nurse Practitioner

## 2019-05-07 DIAGNOSIS — I10 Essential (primary) hypertension: Secondary | ICD-10-CM

## 2019-05-07 DIAGNOSIS — G459 Transient cerebral ischemic attack, unspecified: Secondary | ICD-10-CM

## 2019-05-07 DIAGNOSIS — E1169 Type 2 diabetes mellitus with other specified complication: Secondary | ICD-10-CM

## 2019-05-07 DIAGNOSIS — E785 Hyperlipidemia, unspecified: Secondary | ICD-10-CM

## 2019-05-07 DIAGNOSIS — I361 Nonrheumatic tricuspid (valve) insufficiency: Secondary | ICD-10-CM

## 2019-05-15 ENCOUNTER — Encounter: Payer: Self-pay | Admitting: Nurse Practitioner

## 2019-05-17 ENCOUNTER — Other Ambulatory Visit: Payer: Self-pay | Admitting: Nurse Practitioner

## 2019-05-17 MED ORDER — AZELASTINE HCL 0.05 % OP SOLN: 1.0000 [drp] | Freq: Two times a day (BID) | OPHTHALMIC | 12 refills | Status: DC

## 2019-05-17 MED FILL — AZELASTINE HCL 0.05 % SOLN: 0.05 | 22 days supply | Qty: 6 | Fill #0

## 2019-05-22 MED FILL — ?ALLOPURINOL 100MG TABLET: 100 | 30 days supply | Qty: 60 | Fill #3

## 2019-05-22 MED FILL — LISINOPRIL-HCTZ 10-12.5 MG: 10-12.5 | 30 days supply | Qty: 30 | Fill #5

## 2019-05-22 MED FILL — ?ATORVASTATIN 40MG TABLET: 40 | 30 days supply | Qty: 30 | Fill #2

## 2019-05-28 ENCOUNTER — Encounter: Payer: Self-pay | Admitting: Nurse Practitioner

## 2019-05-28 ENCOUNTER — Other Ambulatory Visit: Payer: Self-pay

## 2019-05-28 ENCOUNTER — Ambulatory Visit: Payer: Self-pay | Attending: Nurse Practitioner | Admitting: Nurse Practitioner

## 2019-05-28 VITALS — BP 135/77 | HR 68 | Temp 97.7°F | Wt 220.0 lb

## 2019-05-28 DIAGNOSIS — E1165 Type 2 diabetes mellitus with hyperglycemia: Secondary | ICD-10-CM

## 2019-05-28 DIAGNOSIS — E785 Hyperlipidemia, unspecified: Secondary | ICD-10-CM

## 2019-05-28 DIAGNOSIS — I10 Essential (primary) hypertension: Secondary | ICD-10-CM

## 2019-05-28 DIAGNOSIS — Z114 Encounter for screening for human immunodeficiency virus [HIV]: Secondary | ICD-10-CM

## 2019-05-28 DIAGNOSIS — Z130 Encounter for screening for diseases of the blood and blood-forming organs and certain disorders involving the immune mechanism: Secondary | ICD-10-CM

## 2019-05-28 LAB — POCT GLYCOSYLATED HEMOGLOBIN (HGB A1C): Hemoglobin A1C: 5.6 % (ref 4.0–5.6)

## 2019-05-28 LAB — GLUCOSE, POCT (MANUAL RESULT ENTRY): POC Glucose: 98 mg/dl (ref 70–99)

## 2019-05-28 MED ORDER — METFORMIN HCL 500 MG PO TABS: 500.0000 mg | ORAL_TABLET | Freq: Every day | ORAL | 0 refills | Status: DC

## 2019-05-28 NOTE — Progress Notes (Signed)
Assessment & Plan:  Christopher Hurst was seen today for follow-up.  Diagnoses and all orders for this visit:  Essential hypertension -     Basic metabolic panel  Controlled type 2 diabetes mellitus with hyperglycemia, without long-term current use of insulin (HCC) -     Glucose (CBG) -     Cancel: HgB A1c -     HgB A1c -     metFORMIN (GLUCOPHAGE) 500 MG tablet; Take 1 tablet (500 mg total) by mouth daily with breakfast.  Dyslipidemia, goal LDL below 70 Continue atorvastatin 40 mg daily.  INSTRUCTIONS: Work on a low fat, heart healthy diet and participate in regular aerobic exercise program by working out at least 150 minutes per week; 5 days a week-30 minutes per day. Avoid red meat/beef/steak,  fried foods. junk foods, sodas, sugary drinks, unhealthy snacking, alcohol and smoking.  Drink at least 80 oz of water per day and monitor your carbohydrate intake daily.   Encounter for screening for HIV -     HIV antibody (with reflex)  Screening for deficiency anemia -     CBC    Patient has been counseled on age-appropriate routine health concerns for screening and prevention. These are reviewed and up-to-date. Referrals have been placed accordingly. Immunizations are up-to-date or declined.    Subjective:    HPI Christopher Hurst. 54 y.o. male presents to office today for follow up. Was treated in [facility] for presumed sinus infection. Patient states Echocardiogram was normal, ECHO, CT and MRI was negative. He is planning to go get the COVID vaccine [employer]) this week.     DM TYPE 2  Has been taking metformin 500 mg daily. Diabetes is well controlled. He has made great progress with his chronic conditions. On ACE and STATIN.  Overdue for eye exam. Referral placed. LDL nearing goal of <70.  Lab Results  Component Value Date   HGBA1C 5.6 05/28/2019   Lab Results  Component Value Date   LDLCALC 71 02/27/2019   Essential Hypertension Well controlled on lisinopril-hctz  10-12.5 mg daily. Denies chest pain, shortness of breath, palpitations, lightheadedness, dizziness, headaches or BLE edema.  BP Readings from Last 3 Encounters:  05/28/19 135/77  11/21/18 134/87  06/03/18 127/82    Dyslipidemia LDL almost at goal of <70.    Lab Results  Component Value Date   LDLCALC 71 02/27/2019     Review of Systems  Constitutional: Negative for fever, malaise/fatigue and weight loss.  HENT: Negative.  Negative for nosebleeds.   Eyes: Negative.  Negative for blurred vision, double vision and photophobia.  Respiratory: Negative.  Negative for cough and shortness of breath.   Cardiovascular: Negative.  Negative for chest pain, palpitations and leg swelling.  Gastrointestinal: Negative.  Negative for heartburn, nausea and vomiting.  Musculoskeletal: Negative.  Negative for myalgias.  Neurological: Negative.  Negative for dizziness, focal weakness, seizures and headaches.  Psychiatric/Behavioral: Negative.  Negative for suicidal ideas.    Past Medical History:  Diagnosis Date  . Acid reflux   . Arthritis   . Diabetes mellitus without complication ([place])   . Gout   . High cholesterol   . Hypertension   . Obesity   . Pneumonia     Past Surgical History:  Procedure Laterality Date  . ANTERIOR CERVICAL DECOMP/DISCECTOMY FUSION N/A 03/17/2018   Procedure: CERVICAL FOUR-FIVE, CERVCAL FIVE-SIX ANTERIOR CERVICAL DECOMPRESSION/DISCECTOMY FUSION, ALLOGRAFT PLATE;  Surgeon: [name], MD;  Location: [facility];  Service: Orthopedics;  Laterality: N/A;  .  CYSTECTOMY     testicle   . KNEE ARTHROSCOPY     left two knee surgery   . SHOULDER ARTHROSCOPY     one left shoulder   . TONSILLECTOMY      Family History  Problem Relation Age of Onset  . Cirrhosis Father   . Stroke Father   . Hypertension Brother     Social History Reviewed with no changes to be made today.   Outpatient Medications Prior to Visit  Medication Sig Dispense Refill  . atorvastatin  (LIPITOR) 40 MG tablet Take 1 tablet (40 mg total) by mouth daily. 90 tablet 3  . azelastine (OPTIVAR) 0.05 % ophthalmic solution Place 1 drop into both eyes 2 (two) times daily. 6 mL 12  . Blood Glucose Monitoring Suppl (TRUE METRIX METER) w/Device KIT Use as instructed. Check blood glucose levels 2 times per day 1 kit 0  . glucose blood (TRUE METRIX BLOOD GLUCOSE TEST) test strip Use as instructed. Check blood glucose levels 2 times per day E11.65 100 each 11  . lisinopril-hydrochlorothiazide (ZESTORETIC) 10-12.5 MG tablet Take 1 tablet by mouth daily. 90 tablet 1  . Multiple Vitamin (MULTIVITAMIN PO) Take by mouth.    . sildenafil (VIAGRA) 100 MG tablet Take 0.5-1 tablets (50-100 mg total) by mouth daily as needed for erectile dysfunction. 5 tablet 11  . TRUEplus Lancets 28G MISC Use as instructed. Check blood glucose level by fingerstick twice per day. E11.65 100 each 3  . allopurinol (ZYLOPRIM) 100 MG tablet Take 2 tablets (200 mg total) by mouth daily. 180 tablet 1  . omeprazole (PRILOSEC OTC) 20 MG tablet Take 1 tablet (20 mg total) by mouth daily. 90 tablet 1  . metFORMIN (GLUCOPHAGE) 500 MG tablet Take 1 tablet (500 mg total) by mouth 2 (two) times daily with a meal. 180 tablet 1   No facility-administered medications prior to visit.    No Known Allergies     Objective:    BP 135/77 (BP Location: Left Arm, Patient Position: Sitting, Cuff Size: Normal)   Pulse 68   Temp 97.7 F (36.5 C) (Temporal)   Wt 220 lb (99.8 kg)   SpO2 98%   BMI 33.45 kg/m  Wt Readings from Last 3 Encounters:  05/28/19 220 lb (99.8 kg)  11/21/18 219 lb 6.4 oz (99.5 kg)  06/03/18 218 lb (98.9 kg)    Physical Exam Vitals and nursing note reviewed.  Constitutional:      Appearance: He is well-developed.  HENT:     Head: Normocephalic and atraumatic.  Cardiovascular:     Rate and Rhythm: Normal rate and regular rhythm.     Heart sounds: Normal heart sounds. No murmur. No friction rub. No gallop.     Pulmonary:     Effort: Pulmonary effort is normal. No tachypnea or respiratory distress.     Breath sounds: Normal breath sounds. No decreased breath sounds, wheezing, rhonchi or rales.  Chest:     Chest wall: No tenderness.  Abdominal:     General: Bowel sounds are normal.     Palpations: Abdomen is soft.  Musculoskeletal:        General: Normal range of motion.     Cervical back: Normal range of motion.  Skin:    General: Skin is warm and dry.  Neurological:     Mental Status: He is alert and oriented to person, place, and time.     Coordination: Coordination normal.  Psychiatric:  Behavior: Behavior normal. Behavior is cooperative.        Thought Content: Thought content normal.        Judgment: Judgment normal.          Patient has been counseled extensively about nutrition and exercise as well as the importance of adherence with medications and regular follow-up. The patient was given clear instructions to go to ER or return to medical center if symptoms don't improve, worsen or new problems develop. The patient verbalized understanding.   Follow-up: Return in about 3 months (around 08/28/2019).   Gildardo Pounds, FNP-BC [facility] [place], [place]   06/16/2019, 5:01 PM

## 2019-05-29 ENCOUNTER — Encounter: Payer: Self-pay | Admitting: Nurse Practitioner

## 2019-05-29 LAB — CBC
Hematocrit: 40.3 % (ref 37.5–51.0)
Hemoglobin: 13.6 g/dL (ref 13.0–17.7)
MCH: 29.4 pg (ref 26.6–33.0)
MCHC: 33.7 g/dL (ref 31.5–35.7)
MCV: 87 fL (ref 79–97)
Platelets: 254 10*3/uL (ref 150–450)
RBC: 4.62 x10E6/uL (ref 4.14–5.80)
RDW: 13.9 % (ref 11.6–15.4)
WBC: 5.3 10*3/uL (ref 3.4–10.8)

## 2019-05-29 LAB — BASIC METABOLIC PANEL
BUN/Creatinine Ratio: 19 (ref 9–20)
BUN: 12 mg/dL (ref 6–24)
CO2: 28 mmol/L (ref 20–29)
Calcium: 10.2 mg/dL (ref 8.7–10.2)
Chloride: 100 mmol/L (ref 96–106)
Creatinine, Ser: 0.64 mg/dL — ABNORMAL LOW (ref 0.76–1.27)
GFR calc Af Amer: 128 mL/min/{1.73_m2} (ref >59)
GFR calc non Af Amer: 111 mL/min/{1.73_m2} (ref >59)
Glucose: 88 mg/dL (ref 65–99)
Potassium: 4.1 mmol/L (ref 3.5–5.2)
Sodium: 143 mmol/L (ref 134–144)

## 2019-05-29 LAB — HIV ANTIBODY (ROUTINE TESTING W REFLEX): HIV Screen 4th Generation wRfx: NONREACTIVE

## 2019-06-08 ENCOUNTER — Encounter: Payer: Self-pay | Admitting: Nurse Practitioner

## 2019-06-19 MED FILL — ?ATORVASTATIN 40MG TABLET: 40 | 30 days supply | Qty: 30 | Fill #3

## 2019-06-19 MED FILL — ?ALLOPURINOL 100MG TABLET: 100 | 30 days supply | Qty: 60 | Fill #4

## 2019-06-19 MED FILL — LISINOPRIL-HCTZ 10-12.5 MG: 10-12.5 | 30 days supply | Qty: 30 | Fill #6

## 2019-06-19 MED FILL — TRUE METRIX TEST STRIP: 50 days supply | Qty: 100 | Fill #1

## 2019-06-19 MED FILL — TRUEplus LANCETS 28G MISC: 50 days supply | Qty: 100 | Fill #1

## 2019-07-18 MED FILL — LISINOPRIL-HCTZ 10-12.5 MG: 10-12.5 | 30 days supply | Qty: 30 | Fill #7

## 2019-07-18 MED FILL — ?ATORVASTATIN 40MG TABLET: 40 | 30 days supply | Qty: 30 | Fill #4

## 2019-07-18 MED FILL — ?ALLOPURINOL 100MG TABLET: 100 | 30 days supply | Qty: 60 | Fill #5

## 2019-07-18 MED FILL — METFORMIN HCL 500 MG TABS: 500 | 30 days supply | Qty: 30 | Fill #0

## 2019-07-22 ENCOUNTER — Encounter: Payer: Self-pay | Admitting: Orthopaedic Surgery

## 2019-07-24 ENCOUNTER — Ambulatory Visit (INDEPENDENT_AMBULATORY_CARE_PROVIDER_SITE_OTHER): Payer: Self-pay

## 2019-07-24 ENCOUNTER — Encounter: Payer: Self-pay | Admitting: Nurse Practitioner

## 2019-07-24 ENCOUNTER — Other Ambulatory Visit: Payer: Self-pay

## 2019-07-24 ENCOUNTER — Encounter: Payer: Self-pay | Admitting: Orthopaedic Surgery

## 2019-07-24 ENCOUNTER — Ambulatory Visit (INDEPENDENT_AMBULATORY_CARE_PROVIDER_SITE_OTHER): Payer: Medicaid Other | Admitting: Orthopaedic Surgery

## 2019-07-24 VITALS — BP 121/79 | HR 77 | Ht 68.0 in | Wt 214.0 lb

## 2019-07-24 DIAGNOSIS — M545 Low back pain, unspecified: Secondary | ICD-10-CM

## 2019-07-24 DIAGNOSIS — G8929 Other chronic pain: Secondary | ICD-10-CM

## 2019-07-24 DIAGNOSIS — M1612 Unilateral primary osteoarthritis, left hip: Secondary | ICD-10-CM

## 2019-07-24 MED ORDER — TRAMADOL HCL 50 MG PO TABS: 50.0000 mg | ORAL_TABLET | Freq: Two times a day (BID) | ORAL | 0 refills | Status: DC | PRN

## 2019-07-24 NOTE — Progress Notes (Signed)
Office Visit Note   Patient: Christopher Hurst.           Date of Birth: 1965/06/09           MRN: [id number] Visit Date: 07/24/2019              Requested by: Claiborne Rigg, NP [address] [place],  [place] [address] PCP: Claiborne Rigg, NP   Assessment & Plan: Visit Diagnoses:  1. Chronic midline low back pain, unspecified whether sciatica present   2. Unilateral primary osteoarthritis, left hip     Plan: We sent in some Ultram he can take 1 twice a day as needed for his back pain.  He will use it sparingly.  Continue use of the cane.  We discussed anesthesia, spinal, Exparel plus Marcaine.  Postoperative therapy use of a walker.  He does not have a walker at home currently.  Questions were elicited and answered.New xrays of his hips reviewed today.   Follow-Up Instructions: No follow-ups on file.   Orders:  Orders Placed This Encounter  Procedures  . XR Lumbar Spine 2-3 Views  . XR HIP UNILAT W OR W/O PELVIS 2-3 VIEWS LEFT   Meds ordered this encounter  Medications  . traMADol (ULTRAM) 50 MG tablet    Sig: Take 1 tablet (50 mg total) by mouth every 12 (twelve) hours as needed.    Dispense:  30 tablet    Refill:  0      Procedures: No procedures performed   Clinical Data: No additional findings.   Subjective: Chief Complaint  Patient presents with  . Left Hip - Pain  . Lower Back - Pain    HPI 54 year old male scheduled for total hip arthroplasty 09/14/2019 returns to discuss total hip arthroplasty and also with onset of back pain lumbosacral junction that is recently started bothering him.  He states when he first gets up he walks with this back and hips in a flexed position for a while before he is able to get to an upright position.  He still has to use a cane to ambulate due to his left groin pain and uses a cane in his right hand.  He is here for updated x-rays of his hip as well.  Review of Systems systems positive for significant weight loss  would BMI now 32.  A1c is down to 5.6 and is only on one Metformin a day.  Previous cervical spine fusion doing well.  Denies neck pain no hand numbness.  All other systems are negative.   Objective: Vital Signs: BP 121/79   Pulse 77   Ht 5\' 8"  (1.727 m)   Wt 214 lb (97.1 kg)   BMI 32.54 kg/m   Physical Exam Constitutional:      Appearance: He is well-developed.  HENT:     Head: Normocephalic and atraumatic.  Eyes:     Pupils: Pupils are equal, round, and reactive to light.  Neck:     Thyroid: No thyromegaly.     Trachea: No tracheal deviation.  Cardiovascular:     Rate and Rhythm: Normal rate.  Pulmonary:     Effort: Pulmonary effort is normal.     Breath sounds: No wheezing.  Abdominal:     General: Bowel sounds are normal.     Palpations: Abdomen is soft.  Skin:    General: Skin is warm and dry.     Capillary Refill: Capillary refill takes less than 2 seconds.  Neurological:  Mental Status: He is alert and oriented to person, place, and time.  Psychiatric:        Behavior: Behavior normal.        Thought Content: Thought content normal.        Judgment: Judgment normal.     Ortho Exam positive tenderness over his greater trochanter some sciatic notch tenderness on the left negative on the right some pain with straight leg raising 90 degrees on the left.  0 degrees internal rotation left hip only 10 degrees right hip with mild right groin pain.  Left hip external rotation only 20 degrees.  Distal pulses palpable.  No pitting edema.  Good capillary refill.  Specialty Comments:  No specialty comments available.  Imaging: XR HIP UNILAT W OR W/O PELVIS 2-3 VIEWS LEFT  Result Date: 07/24/2019 Standing AP pelvis and frog-leg lateral left hip obtained and reviewed.  This shows severe left hip and moderate severe right hip osteoarthritis.  There is bone-on-bone changes left hip and 2 mm joint space right hip.  Marginal osteophytes subchondral sclerosis and subchondral  cyst formation are noted. Impression: Left greater than right hip osteoarthritis as described above.  XR Lumbar Spine 2-3 Views  Result Date: 07/24/2019 2 view x-rays lumbar spine AP and lateral obtained and reviewed.  This negative for compression fracture normal lumbar lordosis.  Sacroiliac joints are normal. Impression: Lumbar spine x-rays negative for acute or chronic changes.    PMFS History: Patient Active Problem List   Diagnosis Date Noted  . Unilateral primary osteoarthritis, left hip 05/02/2018  . S/P cervical spinal fusion 05/02/2018  . Arthritis of left hip 07/19/2017  . Other bilateral secondary osteoarthritis of knee 07/19/2017  . Spondylosis of thoracic region without myelopathy or radiculopathy 07/19/2017  . Midline low back pain without sciatica 04/13/2017  . Obesity   . Essential hypertension   . High cholesterol   . Gout    Past Medical History:  Diagnosis Date  . Acid reflux   . Arthritis   . Diabetes mellitus without complication (HCC)   . Gout   . High cholesterol   . Hypertension   . Obesity   . Pneumonia     Family History  Problem Relation Age of Onset  . Cirrhosis Father   . Stroke Father   . Hypertension Brother     Past Surgical History:  Procedure Laterality Date  . ANTERIOR CERVICAL DECOMP/DISCECTOMY FUSION N/A 03/17/2018   Procedure: CERVICAL FOUR-FIVE, CERVCAL FIVE-SIX ANTERIOR CERVICAL DECOMPRESSION/DISCECTOMY FUSION, ALLOGRAFT PLATE;  Surgeon: Eldred Manges, MD;  Location: [facility];  Service: Orthopedics;  Laterality: N/A;  . CYSTECTOMY     testicle   . KNEE ARTHROSCOPY     left two knee surgery   . SHOULDER ARTHROSCOPY     one left shoulder   . TONSILLECTOMY     Social History   Occupational History  . Not on file  Tobacco Use  . Smoking status: Never Smoker  . Smokeless tobacco: Never Used  Vaping Use  . Vaping Use: Never used  Substance and Sexual Activity  . Alcohol use: Not Currently  . Drug use: No  . Sexual activity:  Yes

## 2019-07-31 ENCOUNTER — Encounter: Payer: Self-pay | Admitting: Nurse Practitioner

## 2019-08-15 ENCOUNTER — Other Ambulatory Visit: Payer: Self-pay

## 2019-08-16 ENCOUNTER — Inpatient Hospital Stay: Admit: 2019-08-16 | Discharge: 2019-08-16 | Disposition: A | Discharge status: Home / Self Care | Payer: Medicaid Other

## 2019-08-16 ENCOUNTER — Emergency Department (HOSPITAL_COMMUNITY)
Admission: EM | Admit: 2019-08-16 | Discharge: 2019-08-16 | Disposition: A | Discharge status: Home / Self Care | Payer: Medicaid Other | Attending: Emergency Medicine | Admitting: Emergency Medicine

## 2019-08-16 ENCOUNTER — Other Ambulatory Visit: Payer: Self-pay

## 2019-08-16 DIAGNOSIS — R42 Dizziness and giddiness: Secondary | ICD-10-CM | POA: Insufficient documentation

## 2019-08-16 DIAGNOSIS — Z5321 Procedure and treatment not carried out due to patient leaving prior to being seen by health care provider: Secondary | ICD-10-CM | POA: Insufficient documentation

## 2019-08-16 LAB — BASIC METABOLIC PANEL
Anion gap: 10 (ref 5–15)
BUN: 9 mg/dL (ref 6–20)
CO2: 30 mmol/L (ref 22–32)
Calcium: 9.5 mg/dL (ref 8.9–10.3)
Chloride: 102 mmol/L (ref 98–111)
Creatinine, Ser: 0.67 mg/dL (ref 0.61–1.24)
GFR calc Af Amer: >60 mL/min (ref >60)
GFR calc non Af Amer: >60 mL/min (ref >60)
Glucose, Bld: 123 mg/dL — ABNORMAL HIGH (ref 70–99)
Potassium: 3.8 mmol/L (ref 3.5–5.1)
Sodium: 142 mmol/L (ref 135–145)

## 2019-08-16 LAB — CBC
HCT: 40.7 % (ref 39.0–52.0)
Hemoglobin: 12.7 g/dL — ABNORMAL LOW (ref 13.0–17.0)
MCH: 28.1 pg (ref 26.0–34.0)
MCHC: 31.2 g/dL (ref 30.0–36.0)
MCV: 90 fL (ref 80.0–100.0)
Platelets: 242 10*3/uL (ref 150–400)
RBC: 4.52 MIL/uL (ref 4.22–5.81)
RDW: 13.7 % (ref 11.5–15.5)
WBC: 4.4 10*3/uL (ref 4.0–10.5)
nRBC: 0 % (ref 0.0–0.2)

## 2019-08-16 LAB — URINALYSIS, ROUTINE W REFLEX MICROSCOPIC
Bilirubin Urine: NEGATIVE
Glucose, UA: NEGATIVE mg/dL
Hgb urine dipstick: NEGATIVE
Ketones, ur: NEGATIVE mg/dL
Leukocytes,Ua: NEGATIVE
Nitrite: NEGATIVE
Protein, ur: NEGATIVE mg/dL
Specific Gravity, Urine: 1.005 (ref 1.005–1.030)
pH: 7 (ref 5.0–8.0)

## 2019-08-16 LAB — CBG MONITORING, ED: Glucose-Capillary: 72 mg/dL (ref 70–99)

## 2019-08-16 NOTE — ED Triage Notes (Signed)
Pt here for intermittent dizziness and lightheadeness x 1 month. Endorses pressure in his sinuses and watery eyes. Hx of vertigo.

## 2019-08-16 NOTE — ED Notes (Signed)
LWBS 

## 2019-08-17 ENCOUNTER — Telehealth: Payer: Medicaid Other | Admitting: Nurse Practitioner

## 2019-08-17 DIAGNOSIS — J329 Chronic sinusitis, unspecified: Secondary | ICD-10-CM

## 2019-08-17 DIAGNOSIS — B9789 Other viral agents as the cause of diseases classified elsewhere: Secondary | ICD-10-CM

## 2019-08-17 MED ORDER — FLUTICASONE PROPIONATE 50 MCG/ACT NA SUSP: 2.0000 | Freq: Every day | NASAL | 0 refills | Status: DC

## 2019-08-17 NOTE — Progress Notes (Signed)
We are sorry that you are not feeling well.  Here is how we plan to help!  Based on what you have shared with me it looks like you have sinusitis.  Sinusitis is inflammation and infection in the sinus cavities of the head.  Based on your presentation I believe you most likely have Acute Viral Sinusitis.This is an infection most likely caused by a virus. There is not specific treatment for viral sinusitis other than to help you with the symptoms until the infection runs its course.  You may use an oral decongestant such as Mucinex D or if you have glaucoma or high blood pressure use plain Mucinex. Saline nasal spray help and can safely be used as often as needed for congestion, I have prescribed: Fluticasone nasal spray two sprays in each nostril once a day.  If necessary, you could also begin taking Zyrtec (ceterizine) 10mg  tablet daily.  You can purchase this medication over-the-counter.  Based on your symptoms and the duration of your symptoms, antibiotics are not recommended at this time.  Some authorities believe that zinc sprays or the use of Echinacea may shorten the course of your symptoms.  Sinus infections are not as easily transmitted as other respiratory infection, however we still recommend that you avoid close contact with loved ones, especially the very young and elderly.  Remember to wash your hands thoroughly throughout the day as this is the number one way to prevent the spread of infection!  Home Care:  Only take medications as instructed by your medical team.  Do not take these medications with alcohol.  A steam or ultrasonic humidifier can help congestion.  You can place a towel over your head and breathe in the steam from hot water coming from a faucet.  Avoid close contacts especially the very young and the elderly.  Cover your mouth when you cough or sneeze.  Always remember to wash your hands.  Get Help Right Away If:  You develop worsening fever or sinus pain.  You  develop a severe head ache or visual changes.  Your symptoms persist after you have completed your treatment plan.  Make sure you  Understand these instructions.  Will watch your condition.  Will get help right away if you are not doing well or get worse.  Your e-visit answers were reviewed by a board certified advanced clinical practitioner to complete your personal care plan.  Depending on the condition, your plan could have included both over the counter or prescription medications.  If there is a problem please reply  once you have received a response from your provider.  Your safety is important to .  If you have drug allergies check your prescription carefully.    You can use MyChart to ask questions about today's visit, request a non-urgent call back, or ask for a work or school excuse for 24 hours related to this e-Visit. If it has been greater than 24 hours you will need to follow up with your provider, or enter a new e-Visit to address those concerns.  You will get an e-mail in the next two days asking about your experience.  I hope that your e-visit has been valuable and will speed your recovery. Thank you for using e-visits.  I have spent at least 5 minutes reviewing and documenting in the patient's chart.

## 2019-08-18 ENCOUNTER — Encounter: Payer: Self-pay | Admitting: Nurse Practitioner

## 2019-08-19 ENCOUNTER — Encounter (HOSPITAL_COMMUNITY): Payer: Self-pay

## 2019-08-19 ENCOUNTER — Ambulatory Visit (HOSPITAL_COMMUNITY)
Admission: EM | Admit: 2019-08-19 | Discharge: 2019-08-19 | Disposition: A | Discharge status: Home / Self Care | Payer: Medicaid Other | Attending: Physician Assistant | Admitting: Physician Assistant

## 2019-08-19 ENCOUNTER — Other Ambulatory Visit: Payer: Self-pay

## 2019-08-19 DIAGNOSIS — H6593 Unspecified nonsuppurative otitis media, bilateral: Secondary | ICD-10-CM

## 2019-08-19 DIAGNOSIS — J019 Acute sinusitis, unspecified: Secondary | ICD-10-CM

## 2019-08-19 MED ORDER — CEFDINIR 250 MG/5ML PO SUSR: 300.0000 mg | Freq: Two times a day (BID) | ORAL | 0 refills | Status: AC

## 2019-08-19 MED ORDER — CETIRIZINE HCL 10 MG PO TABS
10.0000 mg | ORAL_TABLET | Freq: Every day | ORAL | 0 refills | Status: DC
Start: 2019-08-19 — End: 2019-09-15

## 2019-08-19 NOTE — Discharge Instructions (Addendum)
Take the medications as prescribed  If acute dizziness, severe headache, chest pain, shortness of breath , go to the Emergency department  Follow up with your PCP tomorrow

## 2019-08-19 NOTE — ED Triage Notes (Signed)
Patient reports sinus pressure x1 week. States he did an e-visit with his PCP and they prescribed flonase. States it has not provided relief.

## 2019-08-19 NOTE — ED Provider Notes (Signed)
[facility]    CSN: [id number] Arrival date & time: 08/19/19  1510      History   Chief Complaint Chief Complaint  Patient presents with  . Sinus Problem    HPI Christopher Hurst. is a 54 y.o. male.   Patient presents for sinus congestion.  Reports symptoms have been present for at least 7 days.  He was recently trialed on Flonase for viral sinusitis.  He reports no improvement.  Continues to endorse facial pressure and pressure behind the eyes.  He reports a swimmy head feeling and pressure in his ears.  Denies room spinning sensation.  Has not passed out.  He was in communication with his primary care and they recommended him come to urgent care.  He reports other than the pressure in his face and the ear fullness he feels well.  Denies fever chills, cough, shortness of breath no nausea, vomiting, diarrhea.  Patient states that since he had a surgery in his throat and neck he has had issues taking large pills.  Prefers liquids if he is getting medicines or medicines he can crush up.  Patient states he has had dizzy episodes over the last several months.  He reports he was worked up for this at an outside hospital without major findings to include scans of his brain and imaging his heart.  He states that his current symptoms are not dizziness just a swimmy headed feeling and that this is different from the way he is felt before.     Past Medical History:  Diagnosis Date  . Acid reflux   . Arthritis   . Diabetes mellitus without complication ([facility])   . Gout   . High cholesterol   . Hypertension   . Obesity   . Pneumonia     Patient Active Problem List   Diagnosis Date Noted  . Unilateral primary osteoarthritis, left hip 05/02/2018  . S/P cervical spinal fusion 05/02/2018  . Arthritis of left hip 07/19/2017  . Other bilateral secondary osteoarthritis of knee 07/19/2017  . Spondylosis of thoracic region without myelopathy or radiculopathy 07/19/2017  . Midline low  back pain without sciatica 04/13/2017  . Obesity   . Essential hypertension   . High cholesterol   . Gout     Past Surgical History:  Procedure Laterality Date  . ANTERIOR CERVICAL DECOMP/DISCECTOMY FUSION N/A 03/17/2018   Procedure: CERVICAL FOUR-FIVE, CERVCAL FIVE-SIX ANTERIOR CERVICAL DECOMPRESSION/DISCECTOMY FUSION, ALLOGRAFT PLATE;  Surgeon: [name], MD;  Location: [facility];  Service: Orthopedics;  Laterality: N/A;  . CYSTECTOMY     testicle   . KNEE ARTHROSCOPY     left two knee surgery   . SHOULDER ARTHROSCOPY     one left shoulder   . TONSILLECTOMY         Home Medications    Prior to Admission medications   Medication Sig Start Date End Date Taking? Authorizing Provider  allopurinol (ZYLOPRIM) 100 MG tablet Take 2 tablets (200 mg total) by mouth daily. 11/21/18 02/19/19  Gildardo Pounds, NP  atorvastatin (LIPITOR) 40 MG tablet Take 1 tablet (40 mg total) by mouth daily. 02/24/19   Gildardo Pounds, NP  azelastine (OPTIVAR) 0.05 % ophthalmic solution Place 1 drop into both eyes 2 (two) times daily. 05/17/19   Gildardo Pounds, NP  Blood Glucose Monitoring Suppl (TRUE METRIX METER) w/Device KIT Use as instructed. Check blood glucose levels 2 times per day 09/29/18   Charlott Rakes, MD  cefdinir (OMNICEF)  250 MG/5ML suspension Take 6 mLs (300 mg total) by mouth 2 (two) times daily for 7 days. 08/19/19 08/26/19  Darr, Marguerita Beards, PA-C  cetirizine (ZYRTEC ALLERGY) 10 MG tablet Take 1 tablet (10 mg total) by mouth daily for 14 days. 08/19/19 09/02/19  Darr, Marguerita Beards, PA-C  fluticasone (FLONASE) 50 MCG/ACT nasal spray Place 2 sprays into both nostrils daily for 10 days. 08/17/19 08/27/19  Kara Dies, NP  glucose blood (TRUE METRIX BLOOD GLUCOSE TEST) test strip Use as instructed. Check blood glucose levels 2 times per day E11.65 02/24/19   Gildardo Pounds, NP  lisinopril-hydrochlorothiazide (ZESTORETIC) 10-12.5 MG tablet Take 1 tablet by mouth daily. 02/24/19   Gildardo Pounds, NP    metFORMIN (GLUCOPHAGE) 500 MG tablet Take 1 tablet (500 mg total) by mouth daily with breakfast. 05/28/19 08/26/19  Gildardo Pounds, NP  Multiple Vitamin (MULTIVITAMIN PO) Take by mouth.    [provider]  omeprazole (PRILOSEC OTC) 20 MG tablet Take 1 tablet (20 mg total) by mouth daily. 02/24/19 05/25/19  Gildardo Pounds, NP  sildenafil (VIAGRA) 100 MG tablet Take 0.5-1 tablets (50-100 mg total) by mouth daily as needed for erectile dysfunction. 03/25/19   Gildardo Pounds, NP  traMADol (ULTRAM) 50 MG tablet Take 1 tablet (50 mg total) by mouth every 12 (twelve) hours as needed. 07/24/19   [name], MD  TRUEplus Lancets 28G MISC Use as instructed. Check blood glucose level by fingerstick twice per day. E11.65 02/24/19   Gildardo Pounds, NP    Family History Family History  Problem Relation Age of Onset  . Cirrhosis Father   . Stroke Father   . Hypertension Brother     Social History Social History   Tobacco Use  . Smoking status: Never Smoker  . Smokeless tobacco: Never Used  Vaping Use  . Vaping Use: Never used  Substance Use Topics  . Alcohol use: Not Currently  . Drug use: No     Allergies   Patient has no known allergies.   Review of Systems Review of Systems   Physical Exam Triage Vital Signs ED Triage Vitals  Enc Vitals Group     BP      Pulse      Resp      Temp      Temp src      SpO2      Weight      Height      Head Circumference      Peak Flow      Pain Score      Pain Loc      Pain Edu?      Excl. in [place]?    No data found.  Updated Vital Signs BP (!) 149/86   Pulse 78   Temp 98.2 F (36.8 C)   Resp 19   SpO2 99%   Visual Acuity Right Eye Distance:   Left Eye Distance:   Bilateral Distance:    Right Eye Near:   Left Eye Near:    Bilateral Near:     Physical Exam Vitals and nursing note reviewed.  Constitutional:      General: He is not in acute distress.    Appearance: Normal appearance. He is well-developed. He is not  ill-appearing.  HENT:     Head: Normocephalic and atraumatic.     Comments: There is bilateral frontal and maxillary tenderness    Ears:     Comments: Mild  serous effusions present    Nose: Congestion and rhinorrhea present.     Mouth/Throat:     Mouth: Mucous membranes are moist.     Comments: Mild postnasal drip Eyes:     Extraocular Movements: Extraocular movements intact.     Conjunctiva/sclera: Conjunctivae normal.     Pupils: Pupils are equal, round, and reactive to light.  Cardiovascular:     Rate and Rhythm: Normal rate and regular rhythm.     Heart sounds: No murmur heard.   Pulmonary:     Effort: Pulmonary effort is normal. No respiratory distress.     Breath sounds: Normal breath sounds.  Abdominal:     Palpations: Abdomen is soft.     Tenderness: There is no abdominal tenderness.  Musculoskeletal:     Cervical back: Normal range of motion and neck supple. No rigidity.  Skin:    General: Skin is warm and dry.  Neurological:     General: No focal deficit present.     Mental Status: He is alert and oriented to person, place, and time.     Gait: Gait normal.      UC Treatments / Results  Labs (all labs ordered are listed, but only abnormal results are displayed) Labs Reviewed - No data to display  EKG   Radiology No results found.  Procedures Procedures (including critical care time)  Medications Ordered in UC Medications - No data to display  Initial Impression / Assessment and Plan / UC Course  I have reviewed the triage vital signs and the nursing notes.  Pertinent labs & imaging results that were available during my care of the patient were reviewed by me and considered in my medical decision making (see chart for details).     #Sinusitis #Serous effusions of the ear Patient is a 54 year old male presenting with a sinusitis.  Well-appearing in clinic.  Given duration and worsening symptoms will initiate on cefdinir liquids.  Recommended he  start Zyrtec.  Patient to follow-up with his primary care tomorrow.  Discussed that if he had any severe dizziness that he should report to the emergency department.  Discussed general emergency department precautions.  Patient verbalized understanding plan of care Final Clinical Impressions(s) / UC Diagnoses   Final diagnoses:  Acute sinusitis, recurrence not specified, unspecified location  Bilateral serous otitis media, unspecified chronicity     Discharge Instructions     Take the medications as prescribed  If acute dizziness, severe headache, chest pain, shortness of breath , go to the Emergency department  Follow up with your PCP tomorrow     ED Prescriptions    Medication Sig Dispense Auth. Provider   cefdinir (OMNICEF) 250 MG/5ML suspension Take 6 mLs (300 mg total) by mouth 2 (two) times daily for 7 days. 84 mL Zianna Dercole, Marguerita Beards, PA-C   cetirizine (ZYRTEC ALLERGY) 10 MG tablet Take 1 tablet (10 mg total) by mouth daily for 14 days. 14 tablet Dhruvan Gullion, Marguerita Beards, PA-C     PDMP not reviewed this encounter.   Purnell Shoemaker, PA-C 08/19/19 2148

## 2019-08-20 ENCOUNTER — Other Ambulatory Visit: Payer: Self-pay | Admitting: Nurse Practitioner

## 2019-08-20 ENCOUNTER — Encounter: Payer: Self-pay | Admitting: Nurse Practitioner

## 2019-08-20 ENCOUNTER — Telehealth: Payer: Self-pay | Admitting: Nurse Practitioner

## 2019-08-20 DIAGNOSIS — M1A09X0 Idiopathic chronic gout, multiple sites, without tophus (tophi): Secondary | ICD-10-CM

## 2019-08-20 MED FILL — ?ALLOPURINOL 100MG TABLET: 100 | 30 days supply | Qty: 60 | Fill #0

## 2019-08-20 MED FILL — METFORMIN HCL 500 MG TABS: 500 | 30 days supply | Qty: 30 | Fill #1

## 2019-08-20 MED FILL — ATORVASTATIN CALCIUM 40 MG: 40 | 30 days supply | Qty: 30 | Fill #5

## 2019-08-20 MED FILL — LISINOPRIL-HCTZ 10-12.5 MG: 10-12.5 | 30 days supply | Qty: 30 | Fill #8

## 2019-08-20 NOTE — Telephone Encounter (Signed)
Will route to PCP 

## 2019-08-20 NOTE — Telephone Encounter (Signed)
pls FU with pt, Was in ED and also Urgent Care this weekend  said had 2 EKG's and results have been viewed in MyChart. No one called him and result says abnormal so pt is very anxious, will someone give him a call FU at [phone]

## 2019-08-23 NOTE — Telephone Encounter (Signed)
I have already spoken to Mr. Udell regarding this

## 2019-08-28 ENCOUNTER — Ambulatory Visit: Payer: Self-pay | Attending: Nurse Practitioner | Admitting: Nurse Practitioner

## 2019-08-28 ENCOUNTER — Other Ambulatory Visit: Payer: Self-pay | Admitting: Nurse Practitioner

## 2019-08-28 ENCOUNTER — Other Ambulatory Visit: Payer: Self-pay

## 2019-08-28 ENCOUNTER — Encounter: Payer: Self-pay | Admitting: Nurse Practitioner

## 2019-08-28 DIAGNOSIS — E1165 Type 2 diabetes mellitus with hyperglycemia: Secondary | ICD-10-CM

## 2019-08-28 DIAGNOSIS — K219 Gastro-esophageal reflux disease without esophagitis: Secondary | ICD-10-CM

## 2019-08-28 DIAGNOSIS — I10 Essential (primary) hypertension: Secondary | ICD-10-CM

## 2019-08-28 MED ORDER — LISINOPRIL-HYDROCHLOROTHIAZIDE 10-12.5 MG PO TABS: 1.0000 | ORAL_TABLET | Freq: Every day | ORAL | 1 refills | Status: DC

## 2019-08-28 MED ORDER — METFORMIN HCL 500 MG PO TABS: 500.0000 mg | ORAL_TABLET | Freq: Every day | ORAL | 1 refills | Status: DC

## 2019-08-28 MED ORDER — OMEPRAZOLE MAGNESIUM 20 MG PO TBEC: 20.0000 mg | DELAYED_RELEASE_TABLET | Freq: Every day | ORAL | 1 refills | Status: DC

## 2019-08-28 MED FILL — ?OMEPRAZOLE 20 MG CPDR: 20 | 30 days supply | Qty: 30 | Fill #0
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
## 2019-08-29 ENCOUNTER — Ambulatory Visit (INDEPENDENT_AMBULATORY_CARE_PROVIDER_SITE_OTHER): Payer: Self-pay | Admitting: Surgery

## 2019-08-29 ENCOUNTER — Encounter: Payer: Self-pay | Admitting: Surgery

## 2019-08-29 VITALS — BP 124/75 | HR 78 | Ht 68.0 in | Wt 218.0 lb

## 2019-08-29 DIAGNOSIS — M1612 Unilateral primary osteoarthritis, left hip: Secondary | ICD-10-CM

## 2019-08-29 NOTE — Progress Notes (Signed)
54 year old white male history of end-stage DJD left hip and pain comes in for preop evaluation.  States that symptoms unchanged from previous visit.  He is want to proceed with total hip replacement as scheduled.  Today history and physical performed.  Review of systems negative.  Patient was recently seen in the urgent care and treated for a sinus infection.  States that he has recovered from this.  He is off his antibiotic.  Surgical procedure discussed.  All questions answered.

## 2019-08-30 ENCOUNTER — Ambulatory Visit: Payer: Medicaid Other | Admitting: Surgery

## 2019-09-04 NOTE — Progress Notes (Signed)
[facility] [place], [place] - [address] [place] [address] Phone: [phone] Fax: [phone]  [facility] [address] [place] - [address] [facility] [place] [address] Phone: [phone] Fax: [phone]      Your procedure is scheduled on August 27  Report to [facility] Main Entrance "A" at 0530 A.M., and check in at the Admitting office.  Call this number if you have problems the morning of surgery:  [phone]  Call [phone] if you have any questions prior to your surgery date Monday-Friday 8am-4pm    Remember:  Do not eat after midnight the night before your surgery  You may drink clear liquids until 0430 am the morning of your surgery.   Clear liquids allowed are: Water, Non-Citrus Juices (without pulp), Carbonated Beverages, Clear Tea, Black Coffee Only, and Gatorade    Take these medicines the morning of surgery with A SIP OF WATER  allopurinol (ZYLOPRIM)  atorvastatin (LIPITOR) Eye drops if needed cetirizine (ZYRTEC ALLERGY)  fluticasone (FLONASE) omeprazole (PRILOSEC OTC)  traMADol (ULTRAM)  As of today, STOP taking any Aspirin (unless otherwise instructed by your surgeon) Aleve, Naproxen, Ibuprofen, Motrin, Advil, Goody's, BC's, all herbal medications, fish oil, and all vitamins.           WHAT DO I DO ABOUT MY DIABETES MEDICATION?   Marland Kitchen Do not take oral diabetes medicines (pills) the morning of surgery. metFORMIN (GLUCOPHAGE)    HOW TO MANAGE YOUR DIABETES BEFORE AND AFTER SURGERY  Why is it important to control my blood sugar before and after surgery? . Improving blood sugar levels before and after surgery helps healing and can limit problems. . A way of improving blood sugar control is eating a healthy diet by: o  Eating less sugar and carbohydrates o  Increasing activity/exercise o  Talking with your doctor about reaching your blood sugar goals . High blood sugars  (greater than 180 mg/dL) can raise your risk of infections and slow your recovery, so you will need to focus on controlling your diabetes during the weeks before surgery. . Make sure that the doctor who takes care of your diabetes knows about your planned surgery including the date and location.  How do I manage my blood sugar before surgery? . Check your blood sugar at least 4 times a day, starting 2 days before surgery, to make sure that the level is not too high or low. . Check your blood sugar the morning of your surgery when you wake up and every 2 hours until you get to the Short Stay unit. o If your blood sugar is less than 70 mg/dL, you will need to treat for low blood sugar: - Do not take insulin. - Treat a low blood sugar (less than 70 mg/dL) with  cup of clear juice (cranberry or apple), 4 glucose tablets, OR glucose gel. - Recheck blood sugar in 15 minutes after treatment (to make sure it is greater than 70 mg/dL). If your blood sugar is not greater than 70 mg/dL on recheck, call [phone] for further instructions. . Report your blood sugar to the short stay nurse when you get to Short Stay.  . If you are admitted to the hospital after surgery: o Your blood sugar will be checked by the staff and you will probably be given insulin after surgery (instead of oral diabetes medicines) to make sure you have good blood sugar levels. o The goal for  blood sugar control after surgery is 80-180 mg/dL.              Do not wear jewelry            Do not wear lotions, powders, colognes, or deodorant.             Men may shave face and neck.            Do not bring valuables to the hospital.            [facility] is not responsible for any belongings or valuables.  Do NOT Smoke (Tobacco/Vaping) or drink Alcohol 24 hours prior to your procedure If you use a CPAP at night, you may bring all equipment for your overnight stay.   Contacts, glasses, dentures or bridgework may not be worn into  surgery.      For patients admitted to the hospital, discharge time will be determined by your treatment team.   Patients discharged the day of surgery will not be allowed to drive home, and someone needs to stay with them for 24 hours.    Special instructions:   [place]- Preparing For Surgery  Before surgery, you can play an important role. Because skin is not sterile, your skin needs to be as free of germs as possible. You can reduce the number of germs on your skin by washing with CHG (chlorahexidine gluconate) Soap before surgery.  CHG is an antiseptic cleaner which kills germs and bonds with the skin to continue killing germs even after washing.    Oral Hygiene is also important to reduce your risk of infection.  Remember - BRUSH YOUR TEETH THE MORNING OF SURGERY WITH YOUR REGULAR TOOTHPASTE  Please do not use if you have an allergy to CHG or antibacterial soaps. If your skin becomes reddened/irritated stop using the CHG.  Do not shave (including legs and underarms) for at least 48 hours prior to first CHG shower. It is OK to shave your face.  Please follow these instructions carefully.   1. Shower the NIGHT BEFORE SURGERY and the MORNING OF SURGERY with CHG Soap.   2. If you chose to wash your hair, wash your hair first as usual with your normal shampoo.  3. After you shampoo, rinse your hair and body thoroughly to remove the shampoo.  4. Use CHG as you would any other liquid soap. You can apply CHG directly to the skin and wash gently with a scrungie or a clean washcloth.   5. Apply the CHG Soap to your body ONLY FROM THE NECK DOWN.  Do not use on open wounds or open sores. Avoid contact with your eyes, ears, mouth and genitals (private parts). Wash Face and genitals (private parts)  with your normal soap.   6. Wash thoroughly, paying special attention to the area where your surgery will be performed.  7. Thoroughly rinse your body with warm water from the neck  down.  8. DO NOT shower/wash with your normal soap after using and rinsing off the CHG Soap.  9. Pat yourself dry with a CLEAN TOWEL.  10. Wear CLEAN PAJAMAS to bed the night before surgery  11. Place CLEAN SHEETS on your bed the night of your first shower and DO NOT SLEEP WITH PETS.   Day of Surgery: Wear Clean/Comfortable clothing the morning of surgery Do not apply any deodorants/lotions.   Remember to brush your teeth WITH YOUR REGULAR TOOTHPASTE.   Please read over the following  fact sheets that you were given.

## 2019-09-05 ENCOUNTER — Encounter (HOSPITAL_COMMUNITY): Payer: Self-pay

## 2019-09-05 ENCOUNTER — Ambulatory Visit (HOSPITAL_COMMUNITY)
Admission: RE | Admit: 2019-09-05 | Discharge: 2019-09-05 | Disposition: A | Discharge status: Home / Self Care | Payer: Medicaid Other | Source: Ambulatory Visit | Attending: Surgery | Admitting: Surgery

## 2019-09-05 ENCOUNTER — Other Ambulatory Visit: Payer: Self-pay

## 2019-09-05 ENCOUNTER — Encounter (HOSPITAL_COMMUNITY)
Admission: RE | Admit: 2019-09-05 | Discharge: 2019-09-05 | Disposition: A | Discharge status: Home / Self Care | Payer: Medicaid Other | Source: Ambulatory Visit | Attending: Orthopaedic Surgery | Admitting: Orthopaedic Surgery

## 2019-09-05 DIAGNOSIS — Z01818 Encounter for other preprocedural examination: Secondary | ICD-10-CM | POA: Insufficient documentation

## 2019-09-05 LAB — CBC
HCT: 43.3 % (ref 39.0–52.0)
Hemoglobin: 13.9 g/dL (ref 13.0–17.0)
MCH: 29 pg (ref 26.0–34.0)
MCHC: 32.1 g/dL (ref 30.0–36.0)
MCV: 90.4 fL (ref 80.0–100.0)
Platelets: 266 10*3/uL (ref 150–400)
RBC: 4.79 MIL/uL (ref 4.22–5.81)
RDW: 13.5 % (ref 11.5–15.5)
WBC: 6.1 10*3/uL (ref 4.0–10.5)
nRBC: 0 % (ref 0.0–0.2)

## 2019-09-05 LAB — URINALYSIS, ROUTINE W REFLEX MICROSCOPIC
Bilirubin Urine: NEGATIVE
Glucose, UA: NEGATIVE mg/dL
Hgb urine dipstick: NEGATIVE
Ketones, ur: NEGATIVE mg/dL
Leukocytes,Ua: NEGATIVE
Nitrite: NEGATIVE
Protein, ur: NEGATIVE mg/dL
Specific Gravity, Urine: 1.011 (ref 1.005–1.030)
pH: 7 (ref 5.0–8.0)

## 2019-09-05 LAB — COMPREHENSIVE METABOLIC PANEL
ALT: 20 U/L (ref 0–44)
AST: 21 U/L (ref 15–41)
Albumin: 4.3 g/dL (ref 3.5–5.0)
Alkaline Phosphatase: 88 U/L (ref 38–126)
Anion gap: 9 (ref 5–15)
BUN: 8 mg/dL (ref 6–20)
CO2: 31 mmol/L (ref 22–32)
Calcium: 9.8 mg/dL (ref 8.9–10.3)
Chloride: 99 mmol/L (ref 98–111)
Creatinine, Ser: 0.73 mg/dL (ref 0.61–1.24)
GFR calc Af Amer: >60 mL/min (ref >60)
GFR calc non Af Amer: >60 mL/min (ref >60)
Glucose, Bld: 93 mg/dL (ref 70–99)
Potassium: 3.7 mmol/L (ref 3.5–5.1)
Sodium: 139 mmol/L (ref 135–145)
Total Bilirubin: 0.5 mg/dL (ref 0.3–1.2)
Total Protein: 7.5 g/dL (ref 6.5–8.1)

## 2019-09-05 LAB — HEMOGLOBIN A1C
Hgb A1c MFr Bld: 5.6 % (ref 4.8–5.6)
Mean Plasma Glucose: 114.02 mg/dL

## 2019-09-05 LAB — SURGICAL PCR SCREEN
MRSA, PCR: NEGATIVE
Staphylococcus aureus: NEGATIVE

## 2019-09-05 LAB — GLUCOSE, CAPILLARY: Glucose-Capillary: 105 mg/dL — ABNORMAL HIGH (ref 70–99)

## 2019-09-05 NOTE — Progress Notes (Signed)
Pt denies SOB, chest pain, and being under the care of a cardiologist. Pt stated that PCP is Bertram Denver, NP. Pt denies having a stress test and cardiac cath but stated that an echo was recently performed at [facility]. Nurse requested D/C summary, Echo, EKG and all cardiac studies from [facility]; awaiting response. Pt denies having a chest x ray. Pt denies recent labs ( last A1c was 05/2019). Pt reminded to quarantine. Pt verbalized understanding of all pre-op instructions. Pt chart forwarded to PA, Anesthesiology, for review.

## 2019-09-05 NOTE — Progress Notes (Signed)
Your procedure is scheduled on Friday,  September 14, 2019  Report to [facility] Main Entrance "A" at 0530 A.M., and check in at the Admitting office.  Call this number if you have problems the morning of surgery:  [phone]  Call [phone] if you have any questions prior to your surgery date Monday-Friday 8am-4pm    Remember:  Do not eat after midnight the night before your surgery  You may drink clear liquids until 0430 am the morning of your surgery.   Clear liquids allowed are: Water, Non-Citrus Juices (without pulp), Carbonated Beverages, Clear Tea, Black Coffee Only, and Gatorade ( all low sugar/ diabetic ). Please finish you Low Sugar Gatorade by 4:30A.M. the morning of surgery ( Do not sip).    Take these medicines the morning of surgery with A SIP OF WATER  allopurinol (ZYLOPRIM)  atorvastatin (LIPITOR) Eye drops  cetirizine (ZYRTEC ALLERGY)  fluticasone (FLONASE) omeprazole (PRILOSEC OTC)  traMADol (ULTRAM)  As of today, STOP taking any Aspirin (unless otherwise instructed by your surgeon) Aleve, Naproxen, Ibuprofen, Motrin, Advil, Goody's, BC's, all herbal medications, fish oil, and all vitamins.           WHAT DO I DO ABOUT MY DIABETES MEDICATION?   Marland Kitchen Do not take oral diabetes medicines (pills) the morning of surgery. metFORMIN (GLUCOPHAGE)    HOW TO MANAGE YOUR DIABETES BEFORE AND AFTER SURGERY  Why is it important to control my blood sugar before and after surgery? . Improving blood sugar levels before and after surgery helps healing and can limit problems. . A way of improving blood sugar control is eating a healthy diet by: o  Eating less sugar and carbohydrates o  Increasing activity/exercise o  Talking with your doctor about reaching your blood sugar goals . High blood sugars (greater than 180 mg/dL) can raise your risk of infections and slow your recovery, so you will need to focus on controlling your diabetes during the weeks before  surgery. . Make sure that the doctor who takes care of your diabetes knows about your planned surgery including the date and location.  How do I manage my blood sugar before surgery? . Check your blood sugar at least 4 times a day, starting 2 days before surgery, to make sure that the level is not too high or low. . Check your blood sugar the morning of your surgery when you wake up and every 2 hours until you get to the Short Stay unit. o If your blood sugar is less than 70 mg/dL, you will need to treat for low blood sugar: - Treat a low blood sugar (less than 70 mg/dL) with  cup of clear juice (cranberry or apple), 4 glucose tablets, OR glucose gel. - Recheck blood sugar in 15 minutes after treatment (to make sure it is greater than 70 mg/dL). If your blood sugar is not greater than 70 mg/dL on recheck, call [phone] for further instructions. . Report your blood sugar to the short stay nurse when you get to Short Stay.  . If you are admitted to the hospital after surgery: o Your blood sugar will be checked by the staff and you will probably be given insulin after surgery (instead of oral diabetes medicines) to make sure you have good blood sugar levels. o The goal for blood sugar control after surgery is 80-180 mg/dL.              Do not wear jewelry  Do not wear lotions, powders, colognes, or deodorant.             Men may shave face and neck.            Do not bring valuables to the hospital.            [facility] is not responsible for any belongings or valuables.  Do NOT Smoke (Tobacco/Vaping) or drink Alcohol 24 hours prior to your procedure If you use a CPAP at night, you may bring all equipment for your overnight stay.   Contacts, glasses, dentures or bridgework may not be worn into surgery.      For patients admitted to the hospital, discharge time will be determined by your treatment team.   Patients discharged the day of surgery will not be allowed to drive  home, and someone needs to stay with them for 24 hours.    Special instructions:   [place]- Preparing For Surgery  Before surgery, you can play an important role. Because skin is not sterile, your skin needs to be as free of germs as possible. You can reduce the number of germs on your skin by washing with CHG (chlorahexidine gluconate) Soap before surgery.  CHG is an antiseptic cleaner which kills germs and bonds with the skin to continue killing germs even after washing.    Oral Hygiene is also important to reduce your risk of infection.  Remember - BRUSH YOUR TEETH THE MORNING OF SURGERY WITH YOUR REGULAR TOOTHPASTE  Please do not use if you have an allergy to CHG or antibacterial soaps. If your skin becomes reddened/irritated stop using the CHG.  Do not shave (including legs and underarms) for at least 48 hours prior to first CHG shower. It is OK to shave your face.  Please follow these instructions carefully.   1. Shower the NIGHT BEFORE SURGERY and the MORNING OF SURGERY with CHG Soap.   2. If you chose to wash your hair, wash your hair first as usual with your normal shampoo.  3. After you shampoo, rinse your hair and body thoroughly to remove the shampoo.  4. Use CHG as you would any other liquid soap. You can apply CHG directly to the skin and wash gently with a scrungie or a clean washcloth.   5. Apply the CHG Soap to your body ONLY FROM THE NECK DOWN.  Do not use on open wounds or open sores. Avoid contact with your eyes, ears, mouth and genitals (private parts). Wash Face and genitals (private parts)  with your normal soap.   6. Wash thoroughly, paying special attention to the area where your surgery will be performed.  7. Thoroughly rinse your body with warm water from the neck down.  8. DO NOT shower/wash with your normal soap after using and rinsing off the CHG Soap.  9. Pat yourself dry with a CLEAN TOWEL.  10. Wear CLEAN PAJAMAS to bed the night before  surgery  11. Place CLEAN SHEETS on your bed the night of your first shower and DO NOT SLEEP WITH PETS.   Day of Surgery: Wear Clean/Comfortable clothing the morning of surgery Do not apply any deodorants/lotions.   Remember to brush your teeth WITH YOUR REGULAR TOOTHPASTE.   Please read over the following fact sheets that you were given.

## 2019-09-05 NOTE — Progress Notes (Signed)
Pt stated that fasting CBG ranges from 70-104.

## 2019-09-06 NOTE — Progress Notes (Signed)
Anesthesia Chart Review:  Case: [id number] Date/Time: 09/14/19 0715   Procedure: LEFT TOTAL HIP ARTHROPLASTY -DIRECT ANTERIOR (Left Hip)   Anesthesia type: Spinal   Pre-op diagnosis: left hip osteoarthritis   Location: [facility] OR ROOM 06 / [place] OR   Surgeons: Christopher Killings, MD      DISCUSSION: Patient is a 54 year old male scheduled for the above procedure.  History includes never smoker, HTN, hypercholesterolemia, acid reflux, DM2, gout, arthritis, C4-6 ACDF (03/17/18).  BMI is consistent with obesity.  ED evaluation at [facility] 05/07/19 for dizziness, ruled out CVA and thought to have had a vasovagal response (or related to sinusitis). Tele neuro consulted. No arrhythmias on telemetry. Negative CT and MRI of the head. No ICA stenosis by neck CTA. Negative bubble study on echo. CT did show ethmoid sinusitis so treated with a course of azithromycin. Discharged home from ED.   Last evaluation by PCP Christopher Pounds, NP 08/28/19. She is aware of surgery plans and signed a surgical clearance letter from a medical and cardiac standpoint. Notes indicate she reviewed 08/16/19 EKG (which other than PVC, appears overall stable). A1c 5.6%.   Presurgical COVID-19 test scheduled for 09/11/2019.  Anesthesia team to evaluate on the day of surgery.   VS: BP 123/81   Pulse 83   Temp 36.8 C (Oral)   Resp 18   Ht _0  (1.727 m)   Wt 100.7 kg   SpO2 98%   BMI 33.77 kg/m   PROVIDERS: Christopher Pounds, NP is PCP   LABS: Labs reviewed: Acceptable for surgery. (all labs ordered are listed, but only abnormal results are displayed)  Labs Reviewed  GLUCOSE, CAPILLARY - Abnormal; Notable for the following components:      Result Value   Glucose-Capillary 105 (*)    All other components within normal limits  SURGICAL PCR SCREEN  HEMOGLOBIN A1C  CBC  COMPREHENSIVE METABOLIC PANEL  URINALYSIS, ROUTINE W REFLEX MICROSCOPIC    IMAGES: CXR 09/05/19: FINDINGS: The heart size and mediastinal contours are  within normal limits. No focal airspace consolidation, pleural effusion, or pneumothorax. Multilevel degenerative changes within the thoracic spine. Partially visualized cervical ACDF hardware. IMPRESSION: No active cardiopulmonary disease.  MRI Head 05/07/19: MPRESSION: - Unremarkable MRI appearance of the brain. No evidence of acute intracranial abnormality. - Minimal ethmoid sinus mucosal thickening.  CTA head/neck 05/07/19: IMPRESSION: No intracranial arterial occlusion or high-grade stenosis.   EKG: 08/16/19:  Sinus rhythm with sinus arrhythmia with occasional Premature ventricular complexes Minimal voltage criteria for LVH, may be normal variant ( R in aVL ) Possible Lateral infarct , age undetermined Cannot rule out Inferior infarct , age undetermined Abnormal ECG Confirmed by Christopher Hurst [phone]) on 08/17/2019 10:43:50 AM - PVC is new, but otherwise EKG appears overall stable when compared to tracings dating back to 08/23/17. He has known tiny Q waves in lateral leads and II that appear to be insignificant by measurement.    CV: Echo 05/07/19 [facility]): Conclusions: 1.  Sinus rhythm. 2.  This was a technically difficult study with suboptimal views.  Patient refused Definity contrast.  No LV measurements.  Bubble study administered at beginning exam.  2 contrast injections with Valsalva without shunt. 3.  Grossly normal LV size and systolic function.  Unable to comment on regional wall motion. 4.  Trace mitral regurgitation.   5.  Mild tricuspid regurgitation.  RVSP 22 mmHg.   Past Medical History:  Diagnosis Date  . Acid reflux   . Arthritis  B/L hips  . Diabetes mellitus without complication ([place])   . Gout   . High cholesterol   . Hypertension   . Obesity   . Pneumonia     Past Surgical History:  Procedure Laterality Date  . ANTERIOR CERVICAL DECOMP/DISCECTOMY FUSION N/A 03/17/2018   Procedure: CERVICAL FOUR-FIVE, CERVCAL FIVE-SIX ANTERIOR CERVICAL  DECOMPRESSION/DISCECTOMY FUSION, ALLOGRAFT PLATE;  Surgeon: Christopher Killings, MD;  Location: [facility];  Service: Orthopedics;  Laterality: N/A;  . CYSTECTOMY     testicle   . KNEE ARTHROSCOPY     left two knee surgery   . SHOULDER ARTHROSCOPY     one left shoulder   . TONSILLECTOMY      MEDICATIONS: . allopurinol (ZYLOPRIM) 100 MG tablet  . atorvastatin (LIPITOR) 40 MG tablet  . azelastine (OPTIVAR) 0.05 % ophthalmic solution  . Blood Glucose Monitoring Suppl (TRUE METRIX METER) w/Device KIT  . cetirizine (ZYRTEC ALLERGY) 10 MG tablet  . fluticasone (FLONASE) 50 MCG/ACT nasal spray  . glucose blood (TRUE METRIX BLOOD GLUCOSE TEST) test strip  . lisinopril-hydrochlorothiazide (ZESTORETIC) 10-12.5 MG tablet  . metFORMIN (GLUCOPHAGE) 500 MG tablet  . Multiple Vitamin (MULTIVITAMIN PO)  . omeprazole (PRILOSEC OTC) 20 MG tablet  . sildenafil (VIAGRA) 100 MG tablet  . traMADol (ULTRAM) 50 MG tablet  . TRUEplus Lancets 28G MISC   No current facility-administered medications for this encounter.    Christopher Gianotti, PA-C Surgical Short Stay/Anesthesiology [facility] Phone [phone] [facility] Phone [phone] 09/06/2019 5:13 PM

## 2019-09-06 NOTE — Anesthesia Preprocedure Evaluation (Addendum)
Anesthesia Evaluation  Patient identified by MRN, date of birth, ID band Patient awake    Reviewed: Allergy & Precautions, H&P , NPO status , Patient's Chart, lab work & pertinent test results  Airway Mallampati: III  TM Distance: >3 FB Neck ROM: Limited    Dental no notable dental hx. (+) Poor Dentition, Dental Advisory Given   Pulmonary neg pulmonary ROS,    Pulmonary exam normal breath sounds clear to auscultation       Cardiovascular Exercise Tolerance: Good hypertension, Pt. on medications  Rhythm:Regular Rate:Normal     Neuro/Psych negative neurological ROS  negative psych ROS   GI/Hepatic Neg liver ROS, GERD  Medicated,  Endo/Other  diabetes, Type 2, Oral Hypoglycemic Agents  Renal/GU negative Renal ROS  negative genitourinary   Musculoskeletal  (+) Arthritis , Osteoarthritis,    Abdominal   Peds  Hematology negative hematology ROS (+)   Anesthesia Other Findings   Reproductive/Obstetrics negative OB ROS                           Anesthesia Physical Anesthesia Plan  ASA: II  Anesthesia Plan: Spinal and MAC   Post-op Pain Management:    Induction: Intravenous  PONV Risk Score and Plan: 2 and Propofol infusion, Ondansetron and Midazolam  Airway Management Planned: Simple Face Mask  Additional Equipment:   Intra-op Plan:   Post-operative Plan:   Informed Consent: I have reviewed the patients History and Physical, chart, labs and discussed the procedure including the risks, benefits and alternatives for the proposed anesthesia with the patient or authorized representative who has indicated his/her understanding and acceptance.     Dental advisory given  Plan Discussed with: CRNA  Anesthesia Plan Comments: (PAT note written by Myra Gianotti, PA-C. )      Anesthesia Quick Evaluation

## 2019-09-07 ENCOUNTER — Encounter: Payer: Self-pay | Admitting: Nurse Practitioner

## 2019-09-09 ENCOUNTER — Other Ambulatory Visit: Payer: Self-pay | Admitting: Nurse Practitioner

## 2019-09-11 ENCOUNTER — Other Ambulatory Visit (HOSPITAL_COMMUNITY)
Admission: RE | Admit: 2019-09-11 | Discharge: 2019-09-11 | Disposition: A | Discharge status: Home / Self Care | Payer: Medicaid Other | Source: Ambulatory Visit | Attending: Orthopaedic Surgery | Admitting: Orthopaedic Surgery

## 2019-09-11 DIAGNOSIS — Z20822 Contact with and (suspected) exposure to covid-19: Secondary | ICD-10-CM | POA: Insufficient documentation

## 2019-09-11 DIAGNOSIS — Z01812 Encounter for preprocedural laboratory examination: Secondary | ICD-10-CM | POA: Insufficient documentation

## 2019-09-11 LAB — SARS CORONAVIRUS 2 (TAT 6-24 HRS): SARS Coronavirus 2: NEGATIVE

## 2019-09-13 MED ORDER — BUPIVACAINE LIPOSOME 1.3 % IJ SUSP
20.0000 mL | Freq: Once | INTRAMUSCULAR | Status: DC
  Filled 2019-09-13: qty 20

## 2019-09-14 ENCOUNTER — Encounter (HOSPITAL_COMMUNITY): Payer: Self-pay | Admitting: Orthopaedic Surgery

## 2019-09-14 ENCOUNTER — Encounter (HOSPITAL_COMMUNITY)
Admission: RE | Disposition: A | Discharge status: Home / Self Care | Payer: Self-pay | Source: Home / Self Care | Attending: Orthopaedic Surgery

## 2019-09-14 ENCOUNTER — Ambulatory Visit (HOSPITAL_COMMUNITY): Payer: Self-pay | Admitting: Anesthesiology

## 2019-09-14 ENCOUNTER — Other Ambulatory Visit: Payer: Self-pay

## 2019-09-14 ENCOUNTER — Ambulatory Visit (HOSPITAL_COMMUNITY): Payer: Self-pay | Admitting: Vascular Surgery

## 2019-09-14 ENCOUNTER — Ambulatory Visit (HOSPITAL_COMMUNITY): Payer: Self-pay

## 2019-09-14 ENCOUNTER — Observation Stay (HOSPITAL_COMMUNITY): Payer: Self-pay

## 2019-09-14 ENCOUNTER — Observation Stay (HOSPITAL_COMMUNITY)
Admission: RE | Admit: 2019-09-14 | Discharge: 2019-09-15 | Disposition: A | Discharge status: Home / Self Care | Payer: Self-pay | Attending: Orthopaedic Surgery | Admitting: Orthopaedic Surgery

## 2019-09-14 DIAGNOSIS — E119 Type 2 diabetes mellitus without complications: Secondary | ICD-10-CM | POA: Insufficient documentation

## 2019-09-14 DIAGNOSIS — Z79899 Other long term (current) drug therapy: Secondary | ICD-10-CM | POA: Insufficient documentation

## 2019-09-14 DIAGNOSIS — Z09 Encounter for follow-up examination after completed treatment for conditions other than malignant neoplasm: Secondary | ICD-10-CM

## 2019-09-14 DIAGNOSIS — M1612 Unilateral primary osteoarthritis, left hip: Principal | ICD-10-CM | POA: Diagnosis present

## 2019-09-14 DIAGNOSIS — Z7984 Long term (current) use of oral hypoglycemic drugs: Secondary | ICD-10-CM | POA: Insufficient documentation

## 2019-09-14 DIAGNOSIS — Z419 Encounter for procedure for purposes other than remedying health state, unspecified: Secondary | ICD-10-CM

## 2019-09-14 DIAGNOSIS — I10 Essential (primary) hypertension: Secondary | ICD-10-CM | POA: Insufficient documentation

## 2019-09-14 HISTORY — PX: TOTAL HIP ARTHROPLASTY: SHX124

## 2019-09-14 LAB — GLUCOSE, CAPILLARY
Glucose-Capillary: 103 mg/dL — ABNORMAL HIGH (ref 70–99)
Glucose-Capillary: 89 mg/dL (ref 70–99)
Glucose-Capillary: 93 mg/dL (ref 70–99)
Glucose-Capillary: 140 mg/dL — ABNORMAL HIGH (ref 70–99)
Glucose-Capillary: 111 mg/dL — ABNORMAL HIGH (ref 70–99)

## 2019-09-14 SURGERY — ARTHROPLASTY, HIP, TOTAL, ANTERIOR APPROACH
Anesthesia: Monitor Anesthesia Care | Site: Hip | Laterality: Left

## 2019-09-14 MED ORDER — METOCLOPRAMIDE HCL 5 MG PO TABS: 5.0000 mg | ORAL_TABLET | Freq: Three times a day (TID) | ORAL | Status: DC | PRN

## 2019-09-14 MED ORDER — MENTHOL 3 MG MT LOZG: 1.0000 | LOZENGE | OROMUCOSAL | Status: DC | PRN

## 2019-09-14 MED ORDER — METHOCARBAMOL 500 MG PO TABS
ORAL_TABLET | ORAL | Status: AC
  Filled 2019-09-14: qty 1

## 2019-09-14 MED ORDER — METHOCARBAMOL 1000 MG/10ML IJ SOLN
500.0000 mg | Freq: Four times a day (QID) | INTRAVENOUS | Status: DC | PRN
  Filled 2019-09-14: qty 5

## 2019-09-14 MED ORDER — ASPIRIN EC 325 MG PO TBEC: 325.0000 mg | DELAYED_RELEASE_TABLET | Freq: Every day | ORAL | 0 refills | Status: DC

## 2019-09-14 MED ORDER — LIDOCAINE 2% (20 MG/ML) 5 ML SYRINGE
INTRAMUSCULAR | Status: AC
  Filled 2019-09-14: qty 5

## 2019-09-14 MED ORDER — METHOCARBAMOL 500 MG PO TABS
500.0000 mg | ORAL_TABLET | Freq: Four times a day (QID) | ORAL | Status: DC | PRN
  Administered 2019-09-14 (×2): 500 mg via ORAL
  Filled 2019-09-14 (×3): qty 1

## 2019-09-14 MED ORDER — PANTOPRAZOLE SODIUM 40 MG PO TBEC: 40.0000 mg | DELAYED_RELEASE_TABLET | Freq: Every day | ORAL | Status: DC

## 2019-09-14 MED ORDER — FLUTICASONE PROPIONATE 50 MCG/ACT NA SUSP
2.0000 | Freq: Every day | NASAL | Status: DC
  Filled 2019-09-14: qty 16

## 2019-09-14 MED ORDER — ACETAMINOPHEN 325 MG PO TABS: 325.0000 mg | ORAL_TABLET | Freq: Four times a day (QID) | ORAL | Status: DC | PRN

## 2019-09-14 MED ORDER — HYDROCHLOROTHIAZIDE 12.5 MG PO CAPS
12.5000 mg | ORAL_CAPSULE | Freq: Every day | ORAL | Status: DC
  Administered 2019-09-14: 12.5 mg via ORAL
  Filled 2019-09-14: qty 1

## 2019-09-14 MED ORDER — LACTATED RINGERS IV SOLN: INTRAVENOUS | Status: DC | PRN

## 2019-09-14 MED ORDER — ONDANSETRON HCL 4 MG/2ML IJ SOLN
4.0000 mg | Freq: Four times a day (QID) | INTRAMUSCULAR | Status: DC | PRN
  Administered 2019-09-14: 4 mg via INTRAVENOUS
  Filled 2019-09-14: qty 2

## 2019-09-14 MED ORDER — BUPIVACAINE HCL (PF) 0.25 % IJ SOLN
INTRAMUSCULAR | Status: AC
  Filled 2019-09-14: qty 30

## 2019-09-14 MED ORDER — CEFAZOLIN SODIUM-DEXTROSE 2-4 GM/100ML-% IV SOLN
2.0000 g | INTRAVENOUS | Status: AC
  Administered 2019-09-14: 2 g via INTRAVENOUS
  Filled 2019-09-14: qty 100

## 2019-09-14 MED ORDER — DOCUSATE SODIUM 100 MG PO CAPS
100.0000 mg | ORAL_CAPSULE | Freq: Two times a day (BID) | ORAL | Status: DC
  Administered 2019-09-14: 100 mg via ORAL
  Filled 2019-09-14: qty 1

## 2019-09-14 MED ORDER — ALLOPURINOL 100 MG PO TABS
200.0000 mg | ORAL_TABLET | Freq: Every day | ORAL | Status: DC
  Filled 2019-09-14: qty 2

## 2019-09-14 MED ORDER — PHENOL 1.4 % MT LIQD: 1.0000 | OROMUCOSAL | Status: DC | PRN

## 2019-09-14 MED ORDER — SODIUM CHLORIDE 0.9 % IV SOLN: INTRAVENOUS | Status: DC

## 2019-09-14 MED ORDER — MIDAZOLAM HCL 2 MG/2ML IJ SOLN
INTRAMUSCULAR | Status: AC
  Filled 2019-09-14: qty 2

## 2019-09-14 MED ORDER — ATORVASTATIN CALCIUM 40 MG PO TABS: 40.0000 mg | ORAL_TABLET | Freq: Every day | ORAL | Status: DC

## 2019-09-14 MED ORDER — HYDROCHLOROTHIAZIDE 12.5 MG PO CAPS: 12.5000 mg | ORAL_CAPSULE | Freq: Every day | ORAL | Status: DC

## 2019-09-14 MED ORDER — LIDOCAINE HCL (CARDIAC) PF 100 MG/5ML IV SOSY
PREFILLED_SYRINGE | INTRAVENOUS | Status: DC | PRN
  Administered 2019-09-14: 100 mg via INTRAVENOUS

## 2019-09-14 MED ORDER — METHOCARBAMOL 500 MG PO TABS: 500.0000 mg | ORAL_TABLET | Freq: Four times a day (QID) | ORAL | 0 refills | Status: DC | PRN

## 2019-09-14 MED ORDER — BUPIVACAINE IN DEXTROSE 0.75-8.25 % IT SOLN
INTRATHECAL | Status: DC | PRN
  Administered 2019-09-14: 1.8 mL via INTRATHECAL

## 2019-09-14 MED ORDER — PROPOFOL 500 MG/50ML IV EMUL
INTRAVENOUS | Status: DC | PRN
  Administered 2019-09-14: 100 ug/kg/min via INTRAVENOUS

## 2019-09-14 MED ORDER — HYDROMORPHONE HCL 1 MG/ML IJ SOLN
0.2500 mg | INTRAMUSCULAR | Status: DC | PRN
  Administered 2019-09-14 (×2): 0.5 mg via INTRAVENOUS

## 2019-09-14 MED ORDER — PROPOFOL 10 MG/ML IV BOLUS
INTRAVENOUS | Status: AC
  Filled 2019-09-14: qty 20

## 2019-09-14 MED ORDER — PHENYLEPHRINE HCL-NACL 10-0.9 MG/250ML-% IV SOLN
INTRAVENOUS | Status: DC | PRN
  Administered 2019-09-14: 20 ug/min via INTRAVENOUS

## 2019-09-14 MED ORDER — ACETAMINOPHEN 500 MG PO TABS
1000.0000 mg | ORAL_TABLET | Freq: Once | ORAL | Status: AC
  Administered 2019-09-14: 1000 mg via ORAL
  Filled 2019-09-14: qty 2

## 2019-09-14 MED ORDER — GLYCOPYRROLATE PF 0.2 MG/ML IJ SOSY
PREFILLED_SYRINGE | INTRAMUSCULAR | Status: AC
  Filled 2019-09-14: qty 1

## 2019-09-14 MED ORDER — METOCLOPRAMIDE HCL 5 MG/ML IJ SOLN
5.0000 mg | Freq: Three times a day (TID) | INTRAMUSCULAR | Status: DC | PRN
  Administered 2019-09-14: 10 mg via INTRAVENOUS
  Filled 2019-09-14: qty 2

## 2019-09-14 MED ORDER — OXYCODONE HCL 5 MG PO TABS
5.0000 mg | ORAL_TABLET | ORAL | Status: DC | PRN
  Administered 2019-09-14: 10 mg via ORAL
  Administered 2019-09-15: 5 mg via ORAL
  Filled 2019-09-14: qty 2
  Filled 2019-09-14: qty 1
  Filled 2019-09-14: qty 2

## 2019-09-14 MED ORDER — GLYCOPYRROLATE 0.2 MG/ML IJ SOLN
INTRAMUSCULAR | Status: DC | PRN
  Administered 2019-09-14: .2 mg via INTRAVENOUS

## 2019-09-14 MED ORDER — INSULIN ASPART 100 UNIT/ML ~~LOC~~ SOLN
0.0000 [IU] | Freq: Three times a day (TID) | SUBCUTANEOUS | Status: DC
  Administered 2019-09-14 – 2019-09-15 (×2): 2 [IU] via SUBCUTANEOUS

## 2019-09-14 MED ORDER — HYDROMORPHONE HCL 1 MG/ML IJ SOLN
0.5000 mg | INTRAMUSCULAR | Status: DC | PRN
  Filled 2019-09-14: qty 1

## 2019-09-14 MED ORDER — FENTANYL CITRATE (PF) 250 MCG/5ML IJ SOLN
INTRAMUSCULAR | Status: AC
  Filled 2019-09-14: qty 5

## 2019-09-14 MED ORDER — OMEPRAZOLE MAGNESIUM 20 MG PO TBEC: 20.0000 mg | DELAYED_RELEASE_TABLET | Freq: Every day | ORAL | Status: DC

## 2019-09-14 MED ORDER — BUPIVACAINE LIPOSOME 1.3 % IJ SUSP
INTRAMUSCULAR | Status: DC | PRN
  Administered 2019-09-14: 10 mL

## 2019-09-14 MED ORDER — PROPOFOL 10 MG/ML IV BOLUS
INTRAVENOUS | Status: DC | PRN
  Administered 2019-09-14: 20 mg via INTRAVENOUS

## 2019-09-14 MED ORDER — CHLORHEXIDINE GLUCONATE 0.12 % MT SOLN
OROMUCOSAL | Status: AC
  Administered 2019-09-14: 15 mL
  Filled 2019-09-14: qty 15

## 2019-09-14 MED ORDER — LISINOPRIL 10 MG PO TABS
10.0000 mg | ORAL_TABLET | Freq: Every day | ORAL | Status: DC
  Administered 2019-09-14: 10 mg via ORAL
  Filled 2019-09-14: qty 1

## 2019-09-14 MED ORDER — HYDROMORPHONE HCL 1 MG/ML IJ SOLN
INTRAMUSCULAR | Status: AC
  Filled 2019-09-14: qty 1

## 2019-09-14 MED ORDER — ONDANSETRON HCL 4 MG PO TABS
4.0000 mg | ORAL_TABLET | Freq: Four times a day (QID) | ORAL | Status: DC | PRN
  Administered 2019-09-14: 4 mg via ORAL
  Filled 2019-09-14: qty 1

## 2019-09-14 MED ORDER — OXYCODONE-ACETAMINOPHEN 7.5-325 MG PO TABS: 1.0000 | ORAL_TABLET | ORAL | 0 refills | Status: DC | PRN

## 2019-09-14 MED ORDER — TRANEXAMIC ACID-NACL 1000-0.7 MG/100ML-% IV SOLN
INTRAVENOUS | Status: AC
  Filled 2019-09-14: qty 100

## 2019-09-14 MED ORDER — METFORMIN HCL 500 MG PO TABS
500.0000 mg | ORAL_TABLET | Freq: Every day | ORAL | Status: DC
  Filled 2019-09-14: qty 1

## 2019-09-14 MED ORDER — ASPIRIN EC 325 MG PO TBEC
325.0000 mg | DELAYED_RELEASE_TABLET | Freq: Every day | ORAL | Status: DC
  Administered 2019-09-15: 325 mg via ORAL
  Filled 2019-09-14: qty 1

## 2019-09-14 MED ORDER — LISINOPRIL 10 MG PO TABS: 10.0000 mg | ORAL_TABLET | Freq: Every day | ORAL | Status: DC

## 2019-09-14 MED ORDER — LISINOPRIL-HYDROCHLOROTHIAZIDE 10-12.5 MG PO TABS: 1.0000 | ORAL_TABLET | Freq: Every day | ORAL | Status: DC

## 2019-09-14 MED ORDER — TRANEXAMIC ACID-NACL 1000-0.7 MG/100ML-% IV SOLN
INTRAVENOUS | Status: DC | PRN
  Administered 2019-09-14: 1000 mg via INTRAVENOUS

## 2019-09-14 MED ORDER — POLYETHYLENE GLYCOL 3350 17 G PO PACK: 17.0000 g | PACK | Freq: Every day | ORAL | Status: DC | PRN

## 2019-09-14 MED ORDER — BUPIVACAINE HCL (PF) 0.25 % IJ SOLN
INTRAMUSCULAR | Status: DC | PRN
  Administered 2019-09-14: 10 mL

## 2019-09-14 MED FILL — METHOCARBAMOL 500 MG TABS: 500 | 15 days supply | Qty: 60 | Fill #0

## 2019-09-14 SURGICAL SUPPLY — 54 items
ARTICULEZE HEAD (Hips) ×3 IMPLANT
BENZOIN TINCTURE PRP APPL 2/3 (GAUZE/BANDAGES/DRESSINGS) ×3 IMPLANT
BLADE CLIPPER SURG (BLADE) IMPLANT
BLADE SAW SGTL 18X1.27X75 (BLADE) ×2 IMPLANT
BLADE SAW SGTL 18X1.27X75MM (BLADE) ×1
CELLS DAT CNTRL 66122 CELL SVR (MISCELLANEOUS) ×1 IMPLANT
CLOSURE WOUND 1/2 X4 (GAUZE/BANDAGES/DRESSINGS) ×1
COVER SURGICAL LIGHT HANDLE (MISCELLANEOUS) ×3 IMPLANT
COVER WAND RF STERILE (DRAPES) ×3 IMPLANT
DRAPE C-ARM 42X72 X-RAY (DRAPES) ×3 IMPLANT
DRAPE IMP U-DRAPE 54X76 (DRAPES) ×3 IMPLANT
DRAPE STERI IOBAN 125X83 (DRAPES) ×3 IMPLANT
DRAPE U-SHAPE 47X51 STRL (DRAPES) ×9 IMPLANT
DRSG MEPILEX BORDER 4X8 (GAUZE/BANDAGES/DRESSINGS) ×3 IMPLANT
DRSG MEPILEX SACRM 8.7X9.8 (GAUZE/BANDAGES/DRESSINGS) ×3 IMPLANT
DURAPREP 26ML APPLICATOR (WOUND CARE) ×3 IMPLANT
ELECT BLADE 4.0 EZ CLEAN MEGAD (MISCELLANEOUS)
ELECT CAUTERY BLADE 6.4 (BLADE) ×3 IMPLANT
ELECT REM PT RETURN 9FT ADLT (ELECTROSURGICAL) ×3
ELECTRODE BLDE 4.0 EZ CLN MEGD (MISCELLANEOUS) IMPLANT
ELECTRODE REM PT RTRN 9FT ADLT (ELECTROSURGICAL) ×1 IMPLANT
ELIMINATOR HOLE APEX DEPUY (Hips) ×3 IMPLANT
FACESHIELD WRAPAROUND (MASK) ×6 IMPLANT
GLOVE BIOGEL PI IND STRL 8 (GLOVE) ×2 IMPLANT
GLOVE BIOGEL PI INDICATOR 8 (GLOVE) ×4
GLOVE ORTHO TXT STRL SZ7.5 (GLOVE) ×6 IMPLANT
GOWN STRL REUS W/ TWL LRG LVL3 (GOWN DISPOSABLE) ×1 IMPLANT
GOWN STRL REUS W/ TWL XL LVL3 (GOWN DISPOSABLE) ×1 IMPLANT
GOWN STRL REUS W/TWL 2XL LVL3 (GOWN DISPOSABLE) ×3 IMPLANT
GOWN STRL REUS W/TWL LRG LVL3 (GOWN DISPOSABLE) ×2
GOWN STRL REUS W/TWL XL LVL3 (GOWN DISPOSABLE) ×2
HEAD ARTICULEZE (Hips) ×1 IMPLANT
KIT BASIN OR (CUSTOM PROCEDURE TRAY) ×3 IMPLANT
KIT TURNOVER KIT B (KITS) ×3 IMPLANT
LINER NEUTRAL 52X36MM PLUS 4 (Liner) ×3 IMPLANT
MANIFOLD NEPTUNE II (INSTRUMENTS) ×3 IMPLANT
NS IRRIG 1000ML POUR BTL (IV SOLUTION) ×3 IMPLANT
PACK TOTAL JOINT (CUSTOM PROCEDURE TRAY) ×3 IMPLANT
PAD ARMBOARD 7.5X6 YLW CONV (MISCELLANEOUS) ×6 IMPLANT
PIN SECTOR W/GRIP ACE CUP 52MM (Hips) ×3 IMPLANT
RTRCTR WOUND ALEXIS 18CM MED (MISCELLANEOUS) ×3
STEM FEM SZ3 STD ACTIS (Stem) ×3 IMPLANT
STRIP CLOSURE SKIN 1/2X4 (GAUZE/BANDAGES/DRESSINGS) ×2 IMPLANT
SUT VIC AB 0 CT1 27 (SUTURE) ×2
SUT VIC AB 0 CT1 27XBRD ANBCTR (SUTURE) ×1 IMPLANT
SUT VIC AB 2-0 CT1 27 (SUTURE) ×2
SUT VIC AB 2-0 CT1 TAPERPNT 27 (SUTURE) ×1 IMPLANT
SUT VICRYL 4-0 PS2 18IN ABS (SUTURE) ×3 IMPLANT
SUT VLOC 180 0 24IN GS25 (SUTURE) ×3 IMPLANT
TOWEL GREEN STERILE (TOWEL DISPOSABLE) ×6 IMPLANT
TOWEL GREEN STERILE FF (TOWEL DISPOSABLE) ×3 IMPLANT
TRAY CATH 16FR W/PLASTIC CATH (SET/KITS/TRAYS/PACK) IMPLANT
TRAY FOLEY MTR SLVR 16FR STAT (SET/KITS/TRAYS/PACK) IMPLANT
WATER STERILE IRR 1000ML POUR (IV SOLUTION) ×6 IMPLANT

## 2019-09-14 NOTE — Anesthesia Procedure Notes (Signed)
Spinal  Patient location during procedure: OR Start time: 09/14/2019 7:42 AM End time: 09/14/2019 7:47 AM Staffing Performed: anesthesiologist  Anesthesiologist: Gaynelle Adu, MD Preanesthetic Checklist Completed: patient identified, IV checked, risks and benefits discussed, surgical consent, monitors and equipment checked, pre-op evaluation and timeout performed Spinal Block Patient position: sitting Prep: DuraPrep Patient monitoring: cardiac monitor, continuous pulse ox and blood pressure Approach: midline Location: L3-4 Injection technique: single-shot Needle Needle type: Pencan  Needle gauge: 24 G Needle length: 9 cm Assessment Sensory level: T8 Additional Notes Functioning IV was confirmed and monitors were applied. Sterile prep and drape, including hand hygiene and sterile gloves were used. The patient was positioned and the spine was prepped. The skin was anesthetized with lidocaine.  Free flow of clear CSF was obtained prior to injecting local anesthetic into the CSF.  The spinal needle aspirated freely following injection.  The needle was carefully withdrawn.  The patient tolerated the procedure well.

## 2019-09-14 NOTE — Evaluation (Addendum)
I agree with the following treatment note after review of the documentation. This session was performed under the supervision of a licensed clinician.   Farley Ly, PT, DPT  Acute Rehabilitation Services  Pager: [phone]  Physical Therapy Evaluation Patient Details Name: Christopher Hurst. MRN: [id number] DOB: [birth year] Today's Date: 09/14/2019   History of Present Illness  pt is a 54 yo male who is s/p L THA, direct anterior. Pt has a PMH of Cervical fusion, HTN, HLD, T2DM, Pneumonia  Clinical Impression  Pt was evaluated for the above diagnosis and impairments below. Pt required supervision to min assist for bed mobility and min guard to min assist with transfers and ambulation with a RW. Educated about HEP, however, will need handout with HEP during next session. Pt lives at home with his wife and can provide assistance for him. Pt would continue to benefit from acute therapy in order to return him to his PLOF. Will continue to follow acutely.     Follow Up Recommendations Follow surgeon's recommendation for DC plan and follow-up therapies    Equipment Recommendations  Rolling walker with 5" wheels    Recommendations for Other Services       Precautions / Restrictions Precautions Precautions: None Restrictions Weight Bearing Restrictions: Yes LLE Weight Bearing: Weight bearing as tolerated      Mobility  Bed Mobility Overal bed mobility: Needs Assistance Bed Mobility: Rolling;Sidelying to Sit Rolling: Supervision Sidelying to sit: HOB elevated;Min assist       General bed mobility comments: pt required supervision to min assist for getting the LLE off the EOB. Pt required verbal cuing for technique   Transfers Overall transfer level: Needs assistance Equipment used: Rolling walker (2 wheeled) Transfers: Sit to/from Stand Sit to Stand: Min assist         General transfer comment: pt required min assist with RW for lift off assistance. pt required verbal cues  for hand placement with RW  Ambulation/Gait Ambulation/Gait assistance: Min guard Gait Distance (Feet): 75 Feet Assistive device: Rolling walker (2 wheeled) Gait Pattern/deviations: Step-through pattern;Decreased step length - left;Decreased stance time - left;Decreased dorsiflexion - left;Antalgic;Trunk flexed Gait velocity: decreased   General Gait Details: pt had a slow antalgic gait with a RW. Pt required min guard assist with ambulation. Pt was noted to have decrease stance time and step lenghth on L. Pt required verbal cues for RW sequencing and proximity to device  Stairs            Wheelchair Mobility    Modified Rankin (Stroke Patients Only)       Balance Overall balance assessment: Needs assistance Sitting-balance support: No upper extremity supported;Feet supported Sitting balance-Leahy Scale: Fair     Standing balance support: Bilateral upper extremity supported;During functional activity Standing balance-Leahy Scale: Poor Standing balance comment: Reliant on BUE support                Pertinent Vitals/Pain Pain Assessment: 0-10 Pain Score: 6  Pain Location: Left Hip Pain Descriptors / Indicators: Operative site guarding;Grimacing;Discomfort;Sore Pain Intervention(s): Limited activity within patient's tolerance;Monitored during session;Repositioned    Home Living Family/patient expects to be discharged to:: Private residence (states he lives in a public home?) Living Arrangements: Spouse/significant other;Children Available Help at Discharge: Family Type of Home: House Home Access: Stairs to enter Entrance Stairs-Rails: None Entrance Stairs-Number of Steps: 2 Home Layout: One level Home Equipment: Grab bars - toilet;Shower seat;Cane - single point      Prior Function Level of Independence: Independent  with assistive device(s)         Comments: pt utilizes a cane for ambulation     Hand Dominance        Extremity/Trunk Assessment    Upper Extremity Assessment Upper Extremity Assessment: Overall WFL for tasks assessed    Lower Extremity Assessment Lower Extremity Assessment: LLE deficits/detail LLE Deficits / Details: Deficits consistent with post op pain and weakness.     Cervical / Trunk Assessment Cervical / Trunk Assessment: Normal  Communication   Communication: No difficulties  Cognition Arousal/Alertness: Awake/alert Behavior During Therapy: WFL for tasks assessed/performed Overall Cognitive Status: Within Functional Limits for tasks assessed             General Comments General comments (skin integrity, edema, etc.): Pt was educated on partial HEP, will need HEP booklet during next session     Exercises General Exercises - Lower Extremity Ankle Circles/Pumps: AROM;10 reps;Left;Seated Long Arc Quad: AROM;5 reps;Seated;Left Hip Flexion/Marching: AROM;Both;Seated;5 reps (LLE partial ROM )   Assessment/Plan    PT Assessment Patient needs continued PT services  PT Problem List Decreased strength;Decreased range of motion;Decreased activity tolerance;Decreased balance;Decreased mobility;Decreased coordination;Decreased knowledge of use of DME;Decreased safety awareness;Pain       PT Treatment Interventions DME instruction;Gait training;Stair training;Functional mobility training;Therapeutic activities;Therapeutic exercise;Balance training;Neuromuscular re-education;Patient/family education;Modalities    PT Goals (Current goals can be found in the Care Plan section)  Acute Rehab PT Goals Patient Stated Goal: get home tomorrow PT Goal Formulation: With patient Time For Goal Achievement: 09/28/19 Potential to Achieve Goals: Good    Frequency 7X/week   Barriers to discharge        Co-evaluation               AM-PAC PT "6 Clicks" Mobility  Outcome Measure Help needed turning from your back to your side while in a flat bed without using bedrails?: None Help needed moving from lying on  your back to sitting on the side of a flat bed without using bedrails?: A Little Help needed moving to and from a bed to a chair (including a wheelchair)?: A Little Help needed standing up from a chair using your arms (e.g., wheelchair or bedside chair)?: A Little Help needed to walk in hospital room?: A Little Help needed climbing 3-5 steps with a railing? : A Little 6 Click Score: 19    End of Session Equipment Utilized During Treatment: Gait belt Activity Tolerance: Treatment limited secondary to medical complications (Comment) (nausea) Patient left: in chair;with call bell/phone within reach Nurse Communication: Mobility status (pt requested nausea meds) PT Visit Diagnosis: Unsteadiness on feet (R26.81);Other abnormalities of gait and mobility (R26.89);Muscle weakness (generalized) (M62.81);Difficulty in walking, not elsewhere classified (R26.2);Pain Pain - Right/Left: Left Pain - part of body: Hip    Time: 8850-2774 PT Time Calculation (min) (ACUTE ONLY): 25 min   Charges:   PT Evaluation $PT Eval Low Complexity: 1 Low PT Treatments $Gait Training: 8-22 mins       Harmon Pier, SPT  Acute Rehabilitation Services  Office: [phone]  09/14/2019, 5:47 PM

## 2019-09-14 NOTE — H&P (Addendum)
TOTAL HIP ADMISSION H&P  Patient is admitted for left total hip arthroplasty.  Subjective:  Chief Complaint: left hip pain  HPI: Christopher Jacks., 54 y.o. male, has a history of pain and functional disability in the left hip(s) due to arthritis and patient has failed non-surgical conservative treatments for greater than 12 weeks to include NSAID's and/or analgesics, supervised PT with diminished ADL's post treatment, use of assistive devices, weight reduction as appropriate and activity modification.  Onset of symptoms was gradual starting 10 years ago with gradually worsening course since that time.The patient noted no past surgery on the left hip(s).  Patient currently rates pain in the left hip at 10 out of 10 with activity. Patient has night pain, worsening of pain with activity and weight bearing, trendelenberg gait, pain that interfers with activities of daily living, pain with passive range of motion and crepitus. Patient has evidence of subchondral cysts, subchondral sclerosis, periarticular osteophytes and joint space narrowing by imaging studies. This condition presents safety issues increasing the risk of falls.  There is no current active infection.  Patient Active Problem List   Diagnosis Date Noted  . Unilateral primary osteoarthritis, left hip 05/02/2018  . S/P cervical spinal fusion 05/02/2018  . Arthritis of left hip 07/19/2017  . Other bilateral secondary osteoarthritis of knee 07/19/2017  . Spondylosis of thoracic region without myelopathy or radiculopathy 07/19/2017  . Midline low back pain without sciatica 04/13/2017  . Obesity   . Essential hypertension   . High cholesterol   . Gout    Past Medical History:  Diagnosis Date  . Acid reflux   . Arthritis    B/L hips  . Diabetes mellitus without complication (HCC)   . Gout   . High cholesterol   . Hypertension   . Obesity   . Pneumonia     Past Surgical History:  Procedure Laterality Date  . ANTERIOR CERVICAL  DECOMP/DISCECTOMY FUSION N/A 03/17/2018   Procedure: CERVICAL FOUR-FIVE, CERVCAL FIVE-SIX ANTERIOR CERVICAL DECOMPRESSION/DISCECTOMY FUSION, ALLOGRAFT PLATE;  Surgeon: Eldred Manges, MD;  Location: [facility] OR;  Service: Orthopedics;  Laterality: N/A;  . CYSTECTOMY     testicle   . KNEE ARTHROSCOPY     left two knee surgery   . SHOULDER ARTHROSCOPY     one left shoulder   . TONSILLECTOMY      Current Facility-Administered Medications  Medication Dose Route Frequency Provider Last Rate Last Admin  . bupivacaine liposome (EXPAREL) 1.3 % injection 266 mg  20 mL Infiltration Once Eldred Manges, MD      . ceFAZolin (ANCEF) IVPB 2g/100 mL premix  2 g Intravenous On Call to OR Naida Sleight, PA-C       No Known Allergies  Social History   Tobacco Use  . Smoking status: Never Smoker  . Smokeless tobacco: Never Used  Substance Use Topics  . Alcohol use: Not Currently    Family History  Problem Relation Age of Onset  . Cirrhosis Father   . Stroke Father   . Hypertension Brother      Review of Systems  Constitutional: Positive for activity change.  HENT: Negative.   Respiratory: Negative.   Cardiovascular: Negative.   Gastrointestinal: Negative.   Genitourinary: Negative.   Musculoskeletal: Positive for gait problem.  Skin: Negative.   Psychiatric/Behavioral: Negative.     Objective:  Physical Exam Constitutional:      Appearance: He is obese.  HENT:     Head: Normocephalic.  Nose: Nose normal.  Cardiovascular:     Rate and Rhythm: Normal rate.  Pulmonary:     Effort: Pulmonary effort is normal. No respiratory distress.  Musculoskeletal:        General: Tenderness present.     Cervical back: Normal range of motion.  Skin:    General: Skin is warm and dry.  Neurological:     General: No focal deficit present.     Mental Status: He is alert and oriented to person, place, and time.     Vital signs in last 24 hours: Temp:  [98.2 F (36.8 C)] 98.2 F (36.8 C) (08/27  0550) Pulse Rate:  [66] 66 (08/27 0550) Resp:  [18] 18 (08/27 0550) BP: (160)/(81) 160/81 (08/27 0550) SpO2:  [97 %] 97 % (08/27 0550) Weight:  [100.7 kg] 100.7 kg (08/27 0621)  Labs:   Estimated body mass index is 33.77 kg/m as calculated from the following:   Height as of this encounter: 5\' 8"  (1.727 m).   Weight as of this encounter: 100.7 kg.   Imaging Review Plain radiographs demonstrate moderate degenerative joint disease of the left hip(s). The bone quality appears to be good for age and reported activity level.      Assessment/Plan:  End stage arthritis, left hip(s)  The patient history, physical examination, clinical judgement of the provider and imaging studies are consistent with end stage degenerative joint disease of the left hip(s) and total hip arthroplasty is deemed medically necessary. The treatment options including medical management, injection therapy, arthroscopy and arthroplasty were discussed at length. The risks and benefits of total hip arthroplasty were presented and reviewed. The risks due to aseptic loosening, infection, stiffness, dislocation/subluxation,  thromboembolic complications and other imponderables were discussed.  The patient acknowledged the explanation, agreed to proceed with the plan and consent was signed. Patient is being admitted for inpatient treatment for surgery, pain control, PT, OT, prophylactic antibiotics, VTE prophylaxis, progressive ambulation and ADL's and discharge planning.The patient is planning to be discharged home with home health services

## 2019-09-14 NOTE — Interval H&P Note (Signed)
History and Physical Interval Note:  09/14/2019 7:30 AM  Christopher Hurst.  has presented today for surgery, with the diagnosis of left hip osteoarthritis.  The various methods of treatment have been discussed with the patient and family. After consideration of risks, benefits and other options for treatment, the patient has consented to  Procedure(s): LEFT TOTAL HIP ARTHROPLASTY -DIRECT ANTERIOR (Left) as a surgical intervention.  The patient's history has been reviewed, patient examined, no change in status, stable for surgery.  I have reviewed the patient's chart and labs.  Questions were answered to the patient's satisfaction.     Eldred Manges

## 2019-09-14 NOTE — Transfer of Care (Signed)
Immediate Anesthesia Transfer of Care Note  Patient: Christopher Hurst.  Procedure(s) Performed: LEFT TOTAL HIP ARTHROPLASTY -DIRECT ANTERIOR (Left Hip)  Patient Location: PACU  Anesthesia Type:MAC and Spinal  Level of Consciousness: awake, alert  and oriented  Airway & Oxygen Therapy: Patient Spontanous Breathing and Patient connected to nasal cannula oxygen  Post-op Assessment: Report given to RN and Post -op Vital signs reviewed and stable  Post vital signs: Reviewed and stable  Last Vitals:  Vitals Value Taken Time  BP 103/70 09/14/19 0936  Temp    Pulse 99 09/14/19 0941  Resp 13 09/14/19 0941  SpO2 100 % 09/14/19 0941  Vitals shown include unvalidated device data.  Last Pain:  Vitals:   09/14/19 0614  TempSrc:   PainSc: 8          Complications: No complications documented.

## 2019-09-14 NOTE — Op Note (Signed)
Preop diagnosis: Left hip primary osteoarthritis  Postop diagnosis: Same  Procedure: Left total hip arthroplasty direct anterior approach  Surgeon: Annell Greening, MD  Assistant: Zonia Kief, PA-C medically necessary and present for the entire procedure  Anesthesia spinal plus Exparel and Marcaine 10+10 = 20cc  EBL: 200 cc  Drains: None  Implants:Depuy Pinnacle Gription 51mm cup.  36 mm +5 ball.  +4 polyethylene neutral liner 36 x 52 mm.  Actis duofix size 3 femoral stem.  Apex hole illuminator.  Procedure: After induction of spinal anesthesia Foley catheter placed Hana boots patient placed on the Hana table careful padding positioning C-arm was brought in there is good visualization of both hips.  Patient had severe hip osteoarthritis marginal osteophytes no joint space subchondral sclerosis.  Opposite right hip showed mild changes.  1015 drapes were applied above and below.  Hair clipper was used abdomen was taped over DuraPrep and then usual total hip draping including the large shower curtain Betadine Steri-Drape after sterile skin marker.  Timeout procedure was completed.  TXA was given IV.  Direct anterior approach was made 1 fingerbreadth lateral 1 inferior to the ASIS angling to the trochanter.  Subtenons tissue was divided with the Bovie fascia was nicked extended elevated and blunt cobra was placed over the top of the capsule.  Transverse bleeders were coagulated capsule was opened gush of clear joint fluid was noted and the Velcro boot was was placed medially on the neck inside the capsule and the anterior capsule was resected.  Neck was cut under C-arm visualization for appropriate level and angulation and completed superiorly near the trochanter with an osteotome.  Head was head was removed with a corkscrew there was no cartilage left on the head just eburnated bone with deformity of the head.  Left labral remnant was resected with a long scalpel and long pickups.  Sequential reaming up  to 51 for a 52 cup cup was selected with dome hole options if needed but there was extremely tight fit and it was actually difficult to even move the cup once it was about halfway into get it exactly appropriately aligned under C arm.  Once component was in excellent position the last few millimeters of impaction was performed.  52 cup had appropriate cup flexion and abduction.  +4 neutral liner was inserted dialed impacted checked was secure.  Hydraulic hook applied leg was externally rotated 120 taken down and under and then the posterior capsule was divided allowing more lateralization of the femur.  Cookie cutter large trochanteric retractor and medial Mueller was placed.  Sequential preparation of the canal was performed and we spent extra time going lateral since the patient had extremely tight canal in order to make sure we got directly down it when needed more lateralization than normal.  Once this was completed using long curettes cookie-cutter, rongeur we are ready for broaching.  Chili pepper, starter reamer broach progressing up to 1 2 and then finally 3 gave excellent tight fit trials showed a +5 gave excellent stability.  External rotation 90 the leg can be taken halfway to the floor without any subluxation there was trace shuck leg lengths were equal on fluoroscopic images and AP and lateral image showed the broach was directly down the canal.  Trials were removed permanent prosthesis inserted +5 metal ball.  Reduction identical findings of stability.  Recoagulation of bleeders copious irrigation final spot images were taken for documentation and then standard layer closure with V-Loc.  Sutures and subtenons tissue  and skin staple closure.  Postop dressing and transferred to care room.

## 2019-09-14 NOTE — Progress Notes (Signed)
Patient's wife is at bedside and states she can pick him up in the morning but she has to get him before 9 AM so she can make work on time and patient would be staying with her teenage children who are very responsible.  Plan discharge in a.m.  Dressing change in a.m. if needed and office follow-up 1 week with me.

## 2019-09-14 NOTE — Progress Notes (Signed)
Orthopedic Tech Progress Note Patient Details:  Christopher Hurst 1966/01/12 [id number] I applied an OVER HEAD FRAME  With TRAPEZE for patient Patient ID: Christopher Hurst., male   DOB: 1965/05/08, 54 y.o.   MRN: [id number]   Christopher Hurst 09/14/2019, 4:04 PM

## 2019-09-14 NOTE — Anesthesia Postprocedure Evaluation (Signed)
Anesthesia Post Note  Patient: Christopher Hurst.  Procedure(s) Performed: LEFT TOTAL HIP ARTHROPLASTY -DIRECT ANTERIOR (Left Hip)     Patient location during evaluation: PACU Anesthesia Type: MAC and Spinal Level of consciousness: oriented and awake and alert Pain management: pain level controlled Vital Signs Assessment: post-procedure vital signs reviewed and stable Respiratory status: spontaneous breathing, respiratory function stable and patient connected to nasal cannula oxygen Cardiovascular status: blood pressure returned to baseline and stable Postop Assessment: no headache, no backache, no apparent nausea or vomiting and spinal receding Anesthetic complications: no   No complications documented.  Last Vitals:  Vitals:   09/14/19 1020 09/14/19 1050  BP: 106/74 118/83  Pulse: 81 73  Resp: 12 16  Temp:  36.7 C  SpO2: 98% 97%    Last Pain:  Vitals:   09/14/19 1050  TempSrc:   PainSc: [place]

## 2019-09-14 NOTE — Discharge Instructions (Addendum)
INSTRUCTIONS AFTER JOINT REPLACEMENT   o Remove items at home which could result in a fall. This includes throw rugs or furniture in walking pathways o ICE to the affected joint every three hours while awake for 30 minutes at a time, for at least the first 3-5 days, and then as needed for pain and swelling.  Continue to use ice for pain and swelling. You may notice swelling that will progress down to the foot and ankle.  This is normal after surgery.  Elevate your leg when you are not up walking on it.   o Continue to use the breathing machine you got in the hospital (incentive spirometer) which will help keep your temperature down.  It is common for your temperature to cycle up and down following surgery, especially at night when you are not up moving around and exerting yourself.  The breathing machine keeps your lungs expanded and your temperature down.   DIET:  As you were doing prior to hospitalization, we recommend a well-balanced diet.  DRESSING / WOUND CARE / SHOWERING  Okay to shower 1 days postop with dressing on.  No tub soaking.  Do not apply any creams or ointments to incision.  Your dressing is sterile and you can shower and leave it on until you are seen in one week.   ACTIVITY  o Increase activity slowly as tolerated, but follow the weight bearing instructions below.   o No driving for 6 weeks or until further direction given by your physician.  You cannot drive while taking narcotics.  o No lifting or carrying greater than 10 lbs. until further directed by your surgeon. o Avoid periods of inactivity such as sitting longer than an hour when not asleep. This helps prevent blood clots.  o You may return to work once you are authorized by your doctor.     WEIGHT BEARING   Weight bearing as tolerated with assist device (walker, cane, etc) as directed, use it as long as suggested by your surgeon or therapist, typically at least 4-6 weeks.   EXERCISES  Results after joint  replacement surgery are often greatly improved when you follow the exercise, range of motion and muscle strengthening exercises prescribed by your doctor. Safety measures are also important to protect the joint from further injury. Any time any of these exercises cause you to have increased pain or swelling, decrease what you are doing until you are comfortable again and then slowly increase them. If you have problems or questions, call your caregiver or physical therapist for advice.   Rehabilitation is important following a joint replacement. After just a few days of immobilization, the muscles of the leg can become weakened and shrink (atrophy).  These exercises are designed to build up the tone and strength of the thigh and leg muscles and to improve motion. Often times heat used for twenty to thirty minutes before working out will loosen up your tissues and help with improving the range of motion but do not use heat for the first two weeks following surgery (sometimes heat can increase post-operative swelling).   These exercises can be done on a training (exercise) mat, on the floor, on a table or on a bed. Use whatever works the best and is most comfortable for you.    Use music or television while you are exercising so that the exercises are a pleasant break in your day. This will make your life better with the exercises acting as a break in your  routine that you can look forward to.   Perform all exercises about fifteen times, three times per day or as directed.  You should exercise both the operative leg and the other leg as well.  Exercises include:   . Quad Sets - Tighten up the muscle on the front of the thigh (Quad) and hold for 5-10 seconds.   . Straight Leg Raises - With your knee straight (if you were given a brace, keep it on), lift the leg to 60 degrees, hold for 3 seconds, and slowly lower the leg.  Perform this exercise against resistance later as your leg gets stronger.  . Leg Slides:  Lying on your back, slowly slide your foot toward your buttocks, bending your knee up off the floor (only go as far as is comfortable). Then slowly slide your foot back down until your leg is flat on the floor again.  Lawanna Kobus Wings: Lying on your back spread your legs to the side as far apart as you can without causing discomfort.  . Hamstring Strength:  Lying on your back, push your heel against the floor with your leg straight by tightening up the muscles of your buttocks.  Repeat, but this time bend your knee to a comfortable angle, and push your heel against the floor.  You may put a pillow under the heel to make it more comfortable if necessary.   A rehabilitation program following joint replacement surgery can speed recovery and prevent re-injury in the future due to weakened muscles. Contact your doctor or a physical therapist for more information on knee rehabilitation.    CONSTIPATION  Constipation is defined medically as fewer than three stools per week and severe constipation as less than one stool per week.  Even if you have a regular bowel pattern at home, your normal regimen is likely to be disrupted due to multiple reasons following surgery.  Combination of anesthesia, postoperative narcotics, change in appetite and fluid intake all can affect your bowels.   YOU MUST use at least one of the following options; they are listed in order of increasing strength to get the job done.  They are all available over the counter, and you may need to use some, POSSIBLY even all of these options:    Drink plenty of fluids (prune juice may be helpful) and high fiber foods Colace 100 mg by mouth twice a day  Senokot for constipation as directed and as needed Dulcolax (bisacodyl), take with full glass of water  Miralax (polyethylene glycol) once or twice a day as needed.  If you have tried all these things and are unable to have a bowel movement in the first 3-4 days after surgery call either your  surgeon or your primary doctor.    If you experience loose stools or diarrhea, hold the medications until you stool forms back up.  If your symptoms do not get better within 1 week or if they get worse, check with your doctor.  If you experience "the worst abdominal pain ever" or develop nausea or vomiting, please contact the office immediately for further recommendations for treatment.   ITCHING:  If you experience itching with your medications, try taking only a single pain pill, or even half a pain pill at a time.  You can also use Benadryl over the counter for itching or also to help with sleep.   TED HOSE STOCKINGS:  Use stockings on both legs until for at least 2 weeks or as directed by  physician office. They may be removed at night for sleeping.  MEDICATIONS:  See your medication summary on the "After Visit Summary" that nursing will review with you.  You may have some home medications which will be placed on hold until you complete the course of blood thinner medication.  It is important for you to complete the blood thinner medication as prescribed.  PRECAUTIONS:  If you experience chest pain or shortness of breath - call 911 immediately for transfer to the hospital emergency department.   If you develop a fever greater that 101 F, purulent drainage from wound, increased redness or drainage from wound, foul odor from the wound/dressing, or calf pain - CONTACT YOUR SURGEON.                                                   FOLLOW-UP APPOINTMENTS:  If you do not already have a post-op appointment, please call the office for an appointment to be seen by your surgeon.  Guidelines for how soon to be seen are listed in your "After Visit Summary", but are typically between 1-4 weeks after surgery.  OTHER INSTRUCTIONS:   Knee Replacement:  Do not place pillow under knee, focus on keeping the knee straight while resting. CPM instructions: 0-90 degrees, 2 hours in the morning, 2 hours in the  afternoon, and 2 hours in the evening. Place foam block, curve side up under heel at all times except when in CPM or when walking.  DO NOT modify, tear, cut, or change the foam block in any way.   DENTAL ANTIBIOTICS:  In most cases prophylactic antibiotics for Dental procdeures after total joint surgery are not necessary.  Exceptions are as follows:  1. History of prior total joint infection  2. Severely immunocompromised (Organ Transplant, cancer chemotherapy, Rheumatoid biologic meds such as Humera)  3. Poorly controlled diabetes (A1C &gt; 8.0, blood glucose over 200)  If you have one of these conditions, contact your surgeon for an antibiotic prescription, prior to your dental procedure.   MAKE SURE YOU:  . Understand these instructions.  . Get help right away if you are not doing well or get worse.    Thank you for letting us be a part of your medical care team.  It is a privilege we respect greatly.  We hope these instructions will help you stay on track for a fast and full recovery!

## 2019-09-15 ENCOUNTER — Other Ambulatory Visit: Payer: Self-pay | Admitting: Orthopaedic Surgery

## 2019-09-15 LAB — CBC
HCT: 34.6 % — ABNORMAL LOW (ref 39.0–52.0)
Hemoglobin: 11.1 g/dL — ABNORMAL LOW (ref 13.0–17.0)
MCH: 28.2 pg (ref 26.0–34.0)
MCHC: 32.1 g/dL (ref 30.0–36.0)
MCV: 88 fL (ref 80.0–100.0)
Platelets: 199 10*3/uL (ref 150–400)
RBC: 3.93 MIL/uL — ABNORMAL LOW (ref 4.22–5.81)
RDW: 13.6 % (ref 11.5–15.5)
WBC: 8 10*3/uL (ref 4.0–10.5)
nRBC: 0 % (ref 0.0–0.2)

## 2019-09-15 LAB — BASIC METABOLIC PANEL
Anion gap: 9 (ref 5–15)
BUN: 8 mg/dL (ref 6–20)
CO2: 28 mmol/L (ref 22–32)
Calcium: 8.8 mg/dL — ABNORMAL LOW (ref 8.9–10.3)
Chloride: 98 mmol/L (ref 98–111)
Creatinine, Ser: 0.79 mg/dL (ref 0.61–1.24)
GFR calc Af Amer: >60 mL/min (ref >60)
GFR calc non Af Amer: >60 mL/min (ref >60)
Glucose, Bld: 127 mg/dL — ABNORMAL HIGH (ref 70–99)
Potassium: 4 mmol/L (ref 3.5–5.1)
Sodium: 135 mmol/L (ref 135–145)

## 2019-09-15 LAB — GLUCOSE, CAPILLARY: Glucose-Capillary: 124 mg/dL — ABNORMAL HIGH (ref 70–99)

## 2019-09-15 MED ORDER — OXYCODONE HCL 5 MG PO TABS
5.0000 mg | ORAL_TABLET | ORAL | 0 refills | Status: DC | PRN
Start: 2019-09-15 — End: 2020-02-12

## 2019-09-15 MED ORDER — METHOCARBAMOL 500 MG PO TABS: 500.0000 mg | ORAL_TABLET | Freq: Four times a day (QID) | ORAL | 0 refills | Status: DC | PRN

## 2019-09-15 NOTE — Plan of Care (Signed)
Patient alert and oriented, mae's well, voiding adequate amount of urine, swallowing without difficulty, no c/o pain at time of discharge. Patient discharged home with family. Script and discharged instructions given to patient. Patient and family stated understanding of instructions given. Patient has an appointment with Dr. Yates  

## 2019-09-15 NOTE — Progress Notes (Signed)
Physical Therapy Treatment Patient Details Name: Christopher Hurst. MRN: [id number] DOB: 1965-12-22 Today's Date: 09/15/2019    History of Present Illness pt is a 54 yo male who is s/p L THA, direct anterior. Pt has a PMH of Cervical fusion, HTN, HLD, T2DM, Pneumonia    PT Comments    The pt is making good progress with functional transfers and ambulation at this session. He was able to progress ambulation distance and practice technique for stair navigation, but continues to benefit from use of RW and minG for safety due to reduced wt shift to LLE with minimal LLE clearance with each step. The pt and his wife were also educated on a series of seated and standing exercises for strength and stability in the hips to further facilitate the pt's recovery and return to independence. The pt was able to demo with good technique, and neither report having any questions. The pt will continue to benefit from skilled PT to progress gait pattern, strength, and stability, but is safe to return home with assist of his family and continued adherence to the given HEP.     Follow Up Recommendations  Follow surgeon's recommendation for DC plan and follow-up therapies     Equipment Recommendations  Rolling walker with 5" wheels    Recommendations for Other Services       Precautions / Restrictions Precautions Precautions: None Restrictions Weight Bearing Restrictions: Yes LLE Weight Bearing: Weight bearing as tolerated    Mobility  Bed Mobility Overal bed mobility: Needs Assistance Bed Mobility: Rolling;Sidelying to Sit Rolling: Min assist Sidelying to sit: HOB elevated;Min assist       General bed mobility comments: minA for LLE mobility to EOB, then minA to elevate trunk and extra time to reposition at EOB  Transfers Overall transfer level: Needs assistance Equipment used: Rolling walker (2 wheeled) Transfers: Sit to/from Stand Sit to Stand: Min assist         General transfer  comment: minA and VC for hand positioning to power up, no assist for stabilizing in stance  Ambulation/Gait Ambulation/Gait assistance: Min guard Gait Distance (Feet): 150 Feet Assistive device: Rolling walker (2 wheeled) Gait Pattern/deviations: Step-through pattern;Decreased step length - left;Decreased stance time - left;Decreased dorsiflexion - left;Antalgic;Trunk flexed Gait velocity: 0.19 m/s Gait velocity interpretation: <1.31 ft/sec, indicative of household ambulator General Gait Details: pt had a slow antalgic gait with a RW. Pt required min guard assist with ambulation. Pt was noted to have decrease stance time and step lenghth on L. Pt required verbal cues for RW sequencing and proximity to device   Stairs             Wheelchair Mobility    Modified Rankin (Stroke Patients Only)       Balance Overall balance assessment: Needs assistance Sitting-balance support: No upper extremity supported;Feet supported Sitting balance-Leahy Scale: Fair     Standing balance support: Bilateral upper extremity supported;During functional activity Standing balance-Leahy Scale: Fair Standing balance comment: able to stand without UE support to don mask, BUE support for ambulation                            Cognition Arousal/Alertness: Awake/alert Behavior During Therapy: WFL for tasks assessed/performed Overall Cognitive Status: Within Functional Limits for tasks assessed  Exercises Total Joint Exercises Hip ABduction/ADduction: AROM;Both;5 reps;Standing Long Arc Quad: AROM;Both;10 reps;Seated Knee Flexion: AROM;Both;5 reps;Standing Marching in Standing: AROM;Both;5 reps;Standing Standing Hip Extension: AROM;Both;5 reps;Standing    General Comments General comments (skin integrity, edema, etc.): Pt given HEP, pt and his wife educated on HEP      Pertinent Vitals/Pain Pain Assessment: Faces Faces Pain  Scale: Hurts a little bit Pain Location: Left Hip, low back soreness Pain Descriptors / Indicators: Operative site guarding;Grimacing;Discomfort;Sore Pain Intervention(s): Limited activity within patient's tolerance;Monitored during session;Repositioned           PT Goals (current goals can now be found in the care plan section) Acute Rehab PT Goals Patient Stated Goal: get home tomorrow PT Goal Formulation: With patient Time For Goal Achievement: 09/28/19 Potential to Achieve Goals: Good Progress towards PT goals: Progressing toward goals    Frequency    7X/week      PT Plan Current plan remains appropriate       AM-PAC PT "6 Clicks" Mobility   Outcome Measure  Help needed turning from your back to your side while in a flat bed without using bedrails?: None Help needed moving from lying on your back to sitting on the side of a flat bed without using bedrails?: A Little Help needed moving to and from a bed to a chair (including a wheelchair)?: A Little Help needed standing up from a chair using your arms (e.g., wheelchair or bedside chair)?: A Little Help needed to walk in hospital room?: A Little Help needed climbing 3-5 steps with a railing? : A Little 6 Click Score: 19    End of Session Equipment Utilized During Treatment: Gait belt Activity Tolerance: Patient tolerated treatment well Patient left: with call bell/phone within reach;in bed (sitting EOB) Nurse Communication: Mobility status PT Visit Diagnosis: Unsteadiness on feet (R26.81);Other abnormalities of gait and mobility (R26.89);Muscle weakness (generalized) (M62.81);Difficulty in walking, not elsewhere classified (R26.2);Pain Pain - Right/Left: Left Pain - part of body: Hip     Time: 1517-6160 PT Time Calculation (min) (ACUTE ONLY): 28 min  Charges:  $Gait Training: 8-22 mins $Therapeutic Exercise: 8-22 mins                     Rolm Baptise, PT, DPT   Acute Rehabilitation Department Pager #: [phone]   Gaetana Michaelis 09/15/2019, 8:36 AM

## 2019-09-17 ENCOUNTER — Telehealth: Payer: Self-pay | Admitting: Surgery

## 2019-09-17 MED FILL — LISINOPRIL-HCTZ 10-12.5 MG: 10-12.5 | 30 days supply | Qty: 30 | Fill #9

## 2019-09-17 MED FILL — ?ATORVASTATIN 40MG TABLET: 40 | 30 days supply | Qty: 30 | Fill #6

## 2019-09-17 MED FILL — ?ALLOPURINOL 100MG TABLET: 100 | 30 days supply | Qty: 60 | Fill #1

## 2019-09-17 MED FILL — TRUE METRIX TEST STRIP: 50 days supply | Qty: 100 | Fill #2

## 2019-09-17 MED FILL — ?METFORMIN HCL 500MG TABL: 500 | 30 days supply | Qty: 30 | Fill #2

## 2019-09-17 NOTE — Telephone Encounter (Signed)
Patient called. He would like to know how long to wear the compression stockings. His call back number is [phone]

## 2019-09-17 NOTE — Discharge Summary (Signed)
Patient ID: Christopher Hurst. MRN: [id number] DOB/AGE: 55-Oct-1967 55 y.o.  Admit date: 09/14/2019 Discharge date: 09/15/2019 Admission Diagnoses:  Active Problems:   Unilateral primary osteoarthritis, left hip   Arthritis of left hip   Discharge Diagnoses:  Active Problems:   Unilateral primary osteoarthritis, left hip   Arthritis of left hip  status post Procedure(s): LEFT TOTAL HIP ARTHROPLASTY -DIRECT ANTERIOR  Past Medical History:  Diagnosis Date  . Acid reflux   . Arthritis    B/L hips  . Diabetes mellitus without complication ([place])   . Gout   . High cholesterol   . Hypertension   . Obesity   . Pneumonia     Surgeries: Procedure(s): LEFT TOTAL HIP ARTHROPLASTY -DIRECT ANTERIOR on 09/14/2019   Consultants:   Discharged Condition: Improved  Hospital Course: Christopher Hurst. is an 54 y.o. male who was admitted 09/14/2019 for operative treatment of left hip djd. Patient failed conservative treatments (please see the history and physical for the specifics) and had severe unremitting pain that affects sleep, daily activities and work/hobbies. After pre-op clearance, the patient was taken to the operating room on 09/14/2019 and underwent  Procedure(s): LEFT TOTAL HIP ARTHROPLASTY -DIRECT ANTERIOR.    Patient was given perioperative antibiotics:  Anti-infectives (From admission, onward)   Start     Dose/Rate Route Frequency Ordered Stop   09/14/19 0600  ceFAZolin (ANCEF) IVPB 2g/100 mL premix        2 g 200 mL/hr over 30 Minutes Intravenous On call to O.R. 09/14/19 [id number] 09/14/19 0750       Patient was given sequential compression devices and early ambulation to prevent DVT.   Patient benefited maximally from hospital stay and there were no complications. At the time of discharge, the patient was urinating/moving their bowels without difficulty, tolerating a regular diet, pain is controlled with oral pain medications and they have been cleared by PT/OT.   Recent  vital signs: No data found.   Recent laboratory studies:  Recent Labs    09/15/19 0438  WBC 8.0  HGB 11.1*  HCT 34.6*  PLT 199  NA 135  K 4.0  CL 98  CO2 28  BUN 8  CREATININE 0.79  GLUCOSE 127*  CALCIUM 8.8*     Discharge Medications:   Allergies as of 09/15/2019   No Known Allergies     Medication List    STOP taking these medications   azelastine 0.05 % ophthalmic solution Commonly known as: OPTIVAR   cetirizine 10 MG tablet Commonly known as: ZyrTEC Allergy   sildenafil 100 MG tablet Commonly known as: Viagra   traMADol 50 MG tablet Commonly known as: ULTRAM     TAKE these medications   allopurinol 100 MG tablet Commonly known as: ZYLOPRIM TAKE 2 TABLETS (200 MG TOTAL) BY MOUTH DAILY.   aspirin EC 325 MG tablet Take 1 tablet (325 mg total) by mouth daily. Must take at least 4 weeks postop for DVT prophylaxis   atorvastatin 40 MG tablet Commonly known as: LIPITOR Take 1 tablet (40 mg total) by mouth daily.   fluticasone 50 MCG/ACT nasal spray Commonly known as: FLONASE Place 2 sprays into both nostrils daily for 10 days.   lisinopril-hydrochlorothiazide 10-12.5 MG tablet Commonly known as: Zestoretic Take 1 tablet by mouth daily.   metFORMIN 500 MG tablet Commonly known as: GLUCOPHAGE Take 1 tablet (500 mg total) by mouth daily with breakfast.   MULTIVITAMIN PO Take 1 tablet by mouth daily.   omeprazole  20 MG tablet Commonly known as: PRILOSEC OTC Take 1 tablet (20 mg total) by mouth daily.   oxyCODONE-acetaminophen 7.5-325 MG tablet Commonly known as: Percocet Take 1 tablet by mouth every 4 (four) hours as needed for severe pain.   True Metrix Blood Glucose Test test strip Generic drug: glucose blood Use as instructed. Check blood glucose levels 2 times per day E11.65   True Metrix Meter w/Device Kit Use as instructed. Check blood glucose levels 2 times per day   TRUEplus Lancets 28G Misc Use as instructed. Check blood glucose  level by fingerstick twice per day. E11.65       Diagnostic Studies: DG Chest 2 View  Result Date: 09/06/2019 CLINICAL DATA:  Hypertension, diabetes EXAM: CHEST - 2 VIEW COMPARISON:  05/06/2019 FINDINGS: The heart size and mediastinal contours are within normal limits. No focal airspace consolidation, pleural effusion, or pneumothorax. Multilevel degenerative changes within the thoracic spine. Partially visualized cervical ACDF hardware. IMPRESSION: No active cardiopulmonary disease. Electronically Signed   By: Davina Poke D.O.   On: 09/06/2019 08:44   DG C-Arm 1-60 Min  Result Date: 09/14/2019 CLINICAL DATA:  Status post left total hip arthroplasty. EXAM: DG C-ARM 1-60 MIN; OPERATIVE LEFT HIP WITH PELVIS CONTRAST:  None FLUOROSCOPY TIME:  Fluoroscopy Time:  17 seconds Radiation Exposure Index (if provided by the fluoroscopic device): 2.66 mGy Number of Acquired Spot Images: 3 COMPARISON:  07/24/2019 . FINDINGS: Status post left hip arthroplasty. No periprosthetic fracture or dislocation. Hardware components in anatomic alignment. IMPRESSION: Status post left hip arthroplasty. Electronically Signed   By: Kerby Moors M.D.   On: 09/14/2019 09:36   DG Hip Port Unilat With Pelvis 1V Left  Result Date: 09/14/2019 CLINICAL DATA:  Status post total left hip arthroplasty. EXAM: DG HIP (WITH OR WITHOUT PELVIS) 1V PORT LEFT COMPARISON:  None. FINDINGS: No acute fracture or malalignment. Status post total left total hip arthroplasty without evidence of medial complication. Expected overlying subcutaneous soft tissue gas and swelling. 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 Vascular calcifications. IMPRESSION: 1. Expected postoperative changes status post total left total hip arthroplasty without evidence of immediate complication. 2. 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 Electronically Signed   By: Margaretha Sheffield MD   On: 09/14/2019 10:43   DG HIP OPERATIVE UNILAT WITH PELVIS LEFT  Result Date: 09/14/2019 CLINICAL DATA:  Status post left total hip arthroplasty. EXAM: DG C-ARM 1-60 MIN; OPERATIVE LEFT HIP WITH PELVIS CONTRAST:  None FLUOROSCOPY TIME:  Fluoroscopy Time:  17 seconds Radiation Exposure Index (if provided by the fluoroscopic device): 2.66 mGy Number of Acquired Spot Images: 3 COMPARISON:  07/24/2019 . FINDINGS: Status post left hip arthroplasty. No periprosthetic fracture or dislocation. Hardware components in anatomic alignment. IMPRESSION: Status post left hip arthroplasty. Electronically Signed   By: Kerby Moors M.D.   On: 09/14/2019 09:36       Follow-up Information    [name], MD. Schedule an appointment as soon as possible for a visit in 1 day(s).   Specialty: Orthopedic Surgery Why: need return office visit 1 week postop Contact information: [address] [place] [address] [phone]               Discharge Plan:  discharge to home   Disposition:     Signed: Benjiman Core  09/17/2019, 10:27 AM

## 2019-09-17 NOTE — Telephone Encounter (Signed)
Please advise 

## 2019-09-18 ENCOUNTER — Encounter (HOSPITAL_COMMUNITY): Payer: Self-pay | Admitting: Orthopaedic Surgery

## 2019-09-18 ENCOUNTER — Telehealth: Payer: Self-pay

## 2019-09-18 NOTE — Telephone Encounter (Signed)
I called patient and advised. He is having difficulty having a bowel movement and his son is going to pick up Dulcolax.

## 2019-09-18 NOTE — Telephone Encounter (Signed)
Transition Care Management Follow-up Telephone Call  Date of discharge and from where: 09/14/2019, [facility]   How have you been since you were released from the hospital?  He said he is doing okay his only concern was constipation.  He said he has taken the dulcolax with a large glass of water and has already been in contact with the surgeon's office.   Any questions or concerns?  noted above  Items Reviewed:  Did the pt receive and understand the discharge instructions provided?  yes  Medications obtained and verified?  he said that he has all medications and did not have any questions about his med regime,   Any new allergies since your discharge?  none reported   Do you have support at home?  yes he has family with him  No home health ordered.   Has glucometer. Checks blood sugar 3-5 times/week. Had not yet  checked his blood sugar this morning   He continues to wear the compression stockings and said he does not have any edema in his legs.   He said that the dressing is intact on his surgical site and his wife has an extra dressing if needed.  Functional Questionnaire: (I = Independent and D = Dependent) ADLs: independent.  Using RW with ambulation.  WBAT LLE  Follow up appointments reviewed:   PCP Hospital f/u appt confirmed?  Ms Meredeth Ide, NP 11/28/2019. He said that he will call the clinic if he feels he needs to be seen sooner  Specialist Hospital f/u appt confirmed? surgery 09/27/2019  Are transportation arrangements needed?  no  If their condition worsens, is the pt aware to call PCP or go to the Emergency Dept.?  yes  Was the patient provided with contact information for the PCP's office or ED?  he has the phone number for the clinic  Was to pt encouraged to call back with questions or concerns? yes

## 2019-09-18 NOTE — Telephone Encounter (Signed)
Several weeks OR until legs and ankles are no longer swollen

## 2019-09-19 ENCOUNTER — Encounter: Payer: Self-pay | Admitting: Nurse Practitioner

## 2019-09-20 ENCOUNTER — Encounter: Payer: Self-pay | Admitting: Nurse Practitioner

## 2019-09-23 ENCOUNTER — Encounter: Payer: Self-pay | Admitting: Nurse Practitioner

## 2019-09-27 ENCOUNTER — Ambulatory Visit (INDEPENDENT_AMBULATORY_CARE_PROVIDER_SITE_OTHER): Payer: Self-pay | Admitting: Surgery

## 2019-09-27 ENCOUNTER — Ambulatory Visit: Payer: Self-pay

## 2019-09-27 DIAGNOSIS — Z96649 Presence of unspecified artificial hip joint: Secondary | ICD-10-CM

## 2019-09-27 DIAGNOSIS — Z96642 Presence of left artificial hip joint: Secondary | ICD-10-CM

## 2019-09-27 NOTE — Progress Notes (Signed)
Post-Op Visit Note   Patient: Christopher Hurst.           Date of Birth: 1965/03/17           MRN: [id number] Visit Date: 09/27/2019 PCP: Claiborne Rigg, NP   Assessment & Plan:  Chief Complaint:  Chief Complaint  Patient presents with  . Post-op Follow-up  54 year old white male who is about 2-week status post left total replaced returns.  States that he is doing well and pleased with his surgery result up to this point.  He has been doing a lot of home exercises on his own and walking.    Visit Diagnoses:  1. Status post total replacement of hip, unspecified laterality     Plan: Patient will gradually increase his walking.  I did advise him that he may be doing a little bit too much this early in his recovery.  Dr. Ophelia Charter did not order formal PT.  Patient asked me when he can discontinue his walker and I told him that it is hard for me to say.  He can wean himself off this when he feels comfortable.  If there are any issues then he needs to go back to the walker.  Today hip staples removed.  Follow-up with Dr. Ophelia Charter in 4 weeks for recheck.  Patient advised not to drive at least 6 weeks postop.  Follow-Up Instructions: Return in about 4 weeks (around 10/25/2019) for With Dr. Ophelia Charter for recheck left hip replacement.   Orders:  Orders Placed This Encounter  Procedures  . XR HIP UNILAT W OR W/O PELVIS 2-3 VIEWS LEFT   No orders of the defined types were placed in this encounter.   Imaging: No results found.  PMFS History: Patient Active Problem List   Diagnosis Date Noted  . Arthritis of left hip 09/14/2019  . Unilateral primary osteoarthritis, left hip 05/02/2018  . S/P cervical spinal fusion 05/02/2018  . Other bilateral secondary osteoarthritis of knee 07/19/2017  . Spondylosis of thoracic region without myelopathy or radiculopathy 07/19/2017  . Midline low back pain without sciatica 04/13/2017  . Obesity   . Essential hypertension   . High cholesterol   . Gout      Past Medical History:  Diagnosis Date  . Acid reflux   . Arthritis    B/L hips  . Diabetes mellitus without complication (HCC)   . Gout   . High cholesterol   . Hypertension   . Obesity   . Pneumonia     Family History  Problem Relation Age of Onset  . Cirrhosis Father   . Stroke Father   . Hypertension Brother     Past Surgical History:  Procedure Laterality Date  . ANTERIOR CERVICAL DECOMP/DISCECTOMY FUSION N/A 03/17/2018   Procedure: CERVICAL FOUR-FIVE, CERVCAL FIVE-SIX ANTERIOR CERVICAL DECOMPRESSION/DISCECTOMY FUSION, ALLOGRAFT PLATE;  Surgeon: Eldred Manges, MD;  Location: [facility] OR;  Service: Orthopedics;  Laterality: N/A;  . CYSTECTOMY     testicle   . KNEE ARTHROSCOPY     left two knee surgery   . SHOULDER ARTHROSCOPY     one left shoulder   . TONSILLECTOMY    . TOTAL HIP ARTHROPLASTY Left 09/14/2019   Procedure: LEFT TOTAL HIP ARTHROPLASTY -DIRECT ANTERIOR;  Surgeon: Eldred Manges, MD;  Location: [facility] OR;  Service: Orthopedics;  Laterality: Left;   Social History   Occupational History  . Not on file  Tobacco Use  . Smoking status: Never Smoker  . Smokeless tobacco:  Never Used  Vaping Use  . Vaping Use: Never used  Substance and Sexual Activity  . Alcohol use: Not Currently  . Drug use: No  . Sexual activity: Yes   Exam Pleasant white male alert and oriented in no acute stress.  He is ambulating well with his walker.  Today hip staples removed and Steri-Strips applied.  Incision healing well without signs of infection.

## 2019-09-30 ENCOUNTER — Encounter: Payer: Self-pay | Admitting: Nurse Practitioner

## 2019-09-30 DIAGNOSIS — E119 Type 2 diabetes mellitus without complications: Secondary | ICD-10-CM | POA: Insufficient documentation

## 2019-10-09 IMAGING — DX DG CHEST 2V
2 series · 2 of 2 positions shown · non-contrast
Comparison: 03/10/2018

CLINICAL DATA: Cough

EXAM:
CHEST - 2 VIEW

[x chest ap]
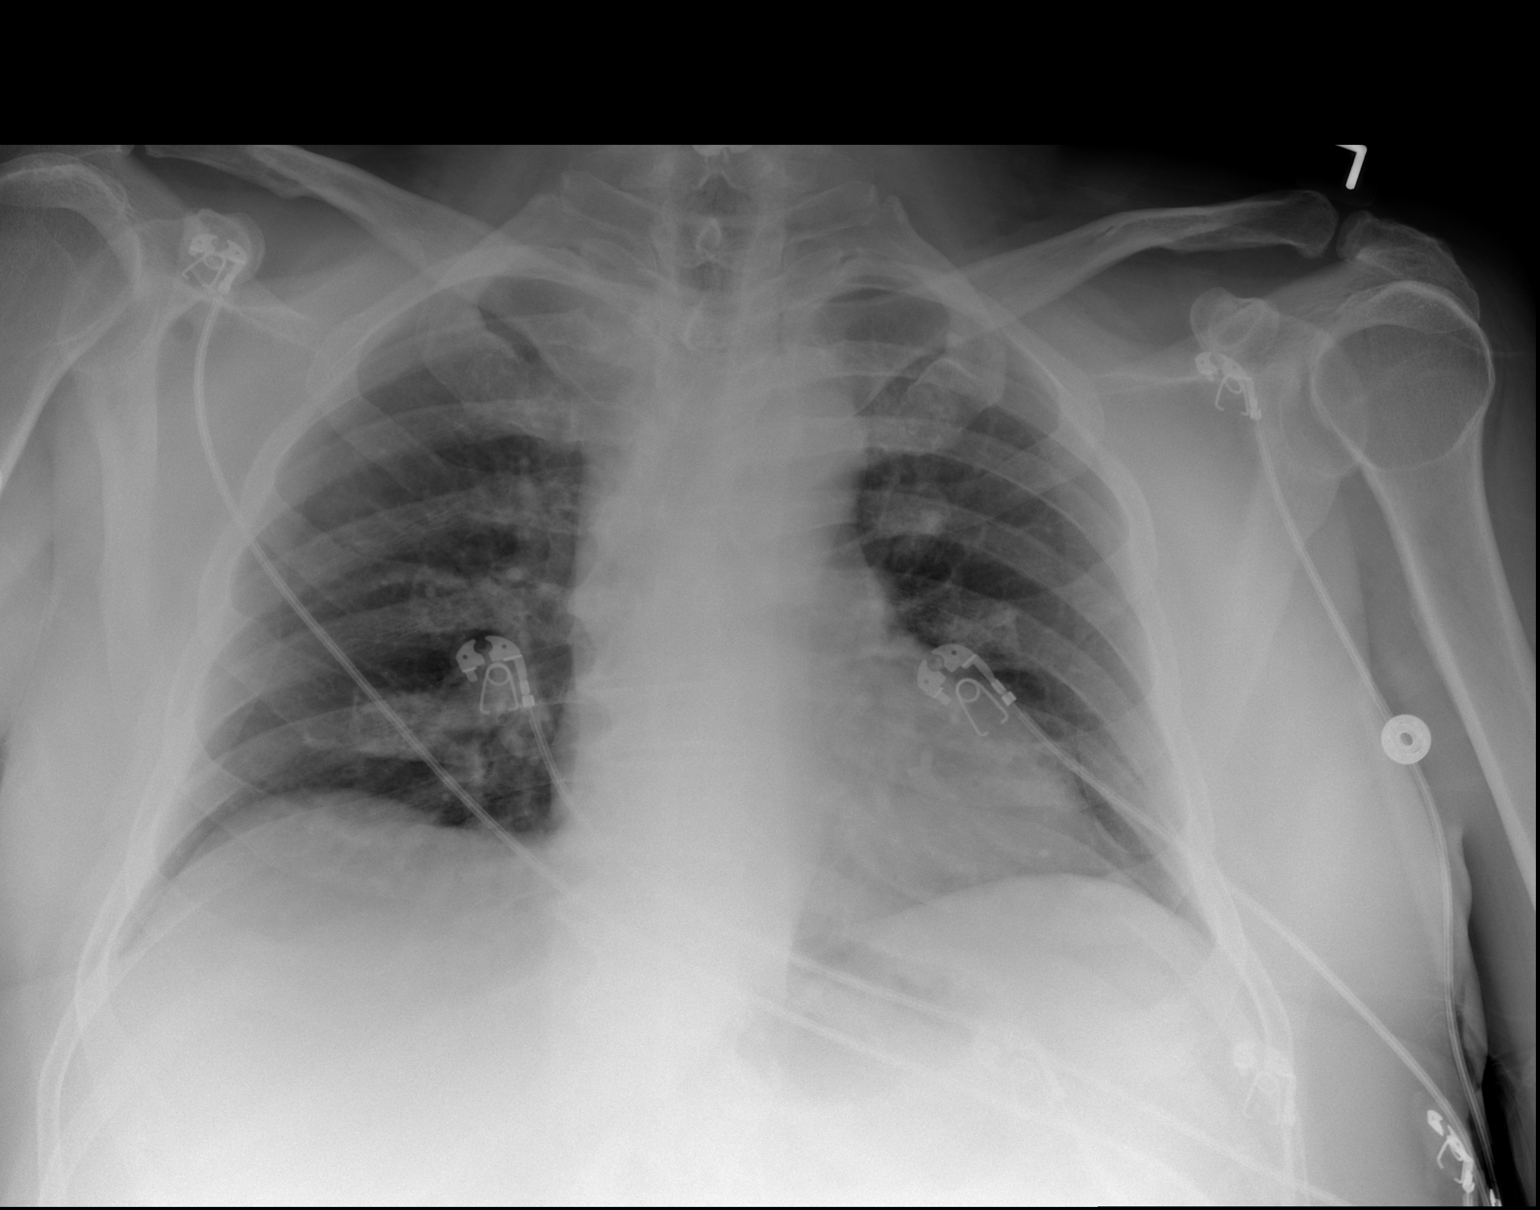

[w chest lat]
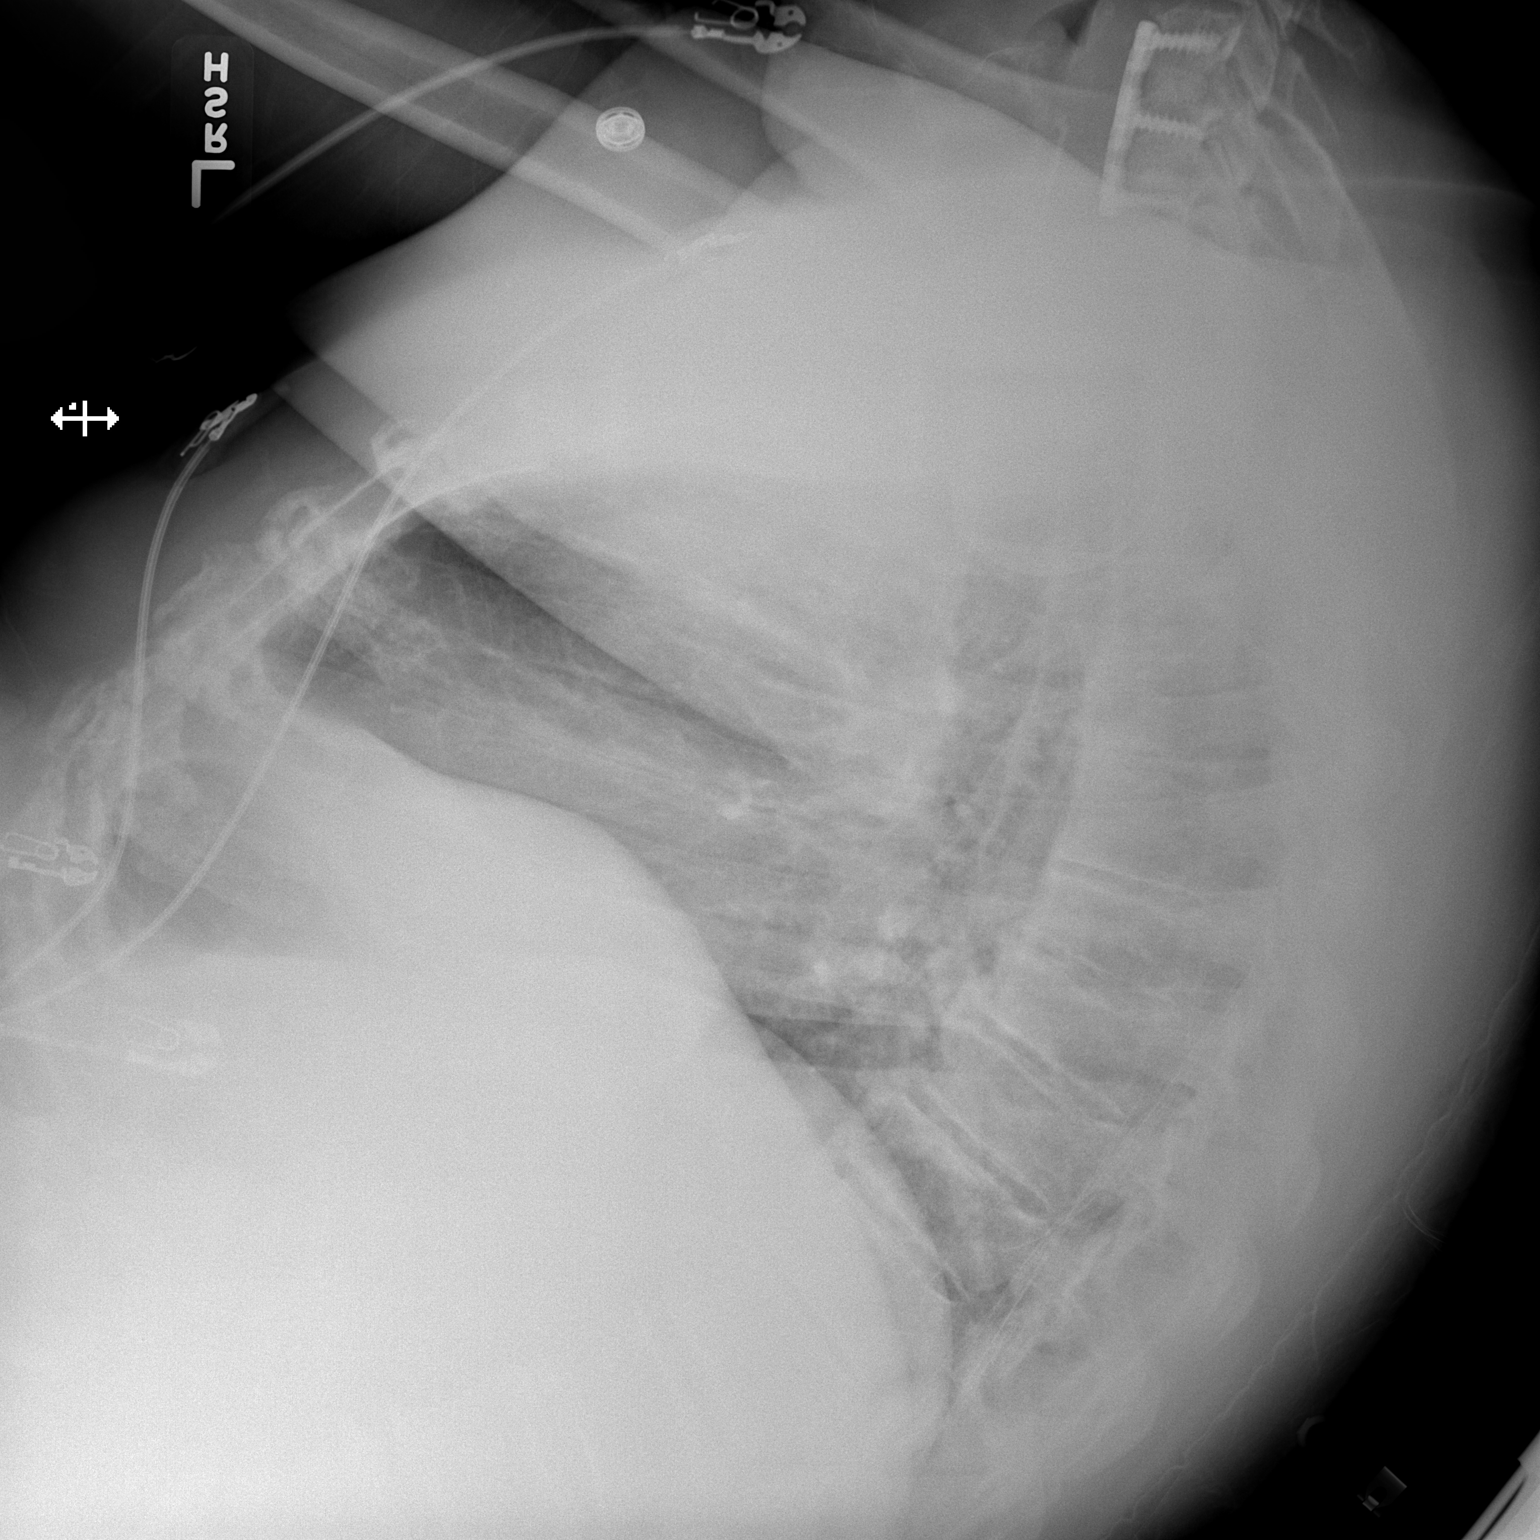

[2 of 2 positions shown; findings below may reference images not displayed]

FINDINGS: The heart size and mediastinal contours are within normal limits.
Both lungs are clear. The visualized skeletal structures are
unremarkable.
IMPRESSION: No active cardiopulmonary disease.

## 2019-10-18 ENCOUNTER — Encounter: Payer: Self-pay | Admitting: Orthopaedic Surgery

## 2019-10-22 MED FILL — LISINOPRIL-HCTZ 10-12.5 MG: 10-12.5 | 30 days supply | Qty: 30 | Fill #10

## 2019-10-22 MED FILL — ?METFORMIN HCL 500MG TABL: 500 | 30 days supply | Qty: 60 | Fill #2

## 2019-10-22 MED FILL — ?ALLOPURINOL 100MG TABLET: 100 | 30 days supply | Qty: 60 | Fill #2

## 2019-10-22 MED FILL — ?ATORVASTATIN 40MG TABLET: 40 | 30 days supply | Qty: 30 | Fill #7

## 2019-10-24 ENCOUNTER — Ambulatory Visit (INDEPENDENT_AMBULATORY_CARE_PROVIDER_SITE_OTHER): Payer: Self-pay | Admitting: Orthopaedic Surgery

## 2019-10-24 ENCOUNTER — Encounter: Payer: Self-pay | Admitting: Orthopaedic Surgery

## 2019-10-24 DIAGNOSIS — Z96642 Presence of left artificial hip joint: Secondary | ICD-10-CM

## 2019-10-24 NOTE — Progress Notes (Signed)
   Post-Op Visit Note   Patient: Christopher Hurst.           Date of Birth: [birth year]           MRN: [id number] Visit Date: 10/24/2019 PCP: Claiborne Rigg, NP   Assessment & Plan: post left lower part plasty.  He is amatory with a cane leg lengths are equal x-rays look good.  He stopped his pain medication after 2 days.  He does have arthritis in the opposite hip fairly severe but is not really having any pain at this point.  I plan to recheck him in 6 months.  He is very happy with the surgical result.  Chief Complaint:  Chief Complaint  Patient presents with  . Left Hip - Routine Post Op   Visit Diagnoses:  1. S/P total left hip arthroplasty     Plan: Post left total hip arthroplasty.  Incision looks good he is happy with the surgical result.  We will recheck him in 6 months repeat x-rays at that time standing AP to see both hips and frog-leg right hip.  Follow-Up Instructions: No follow-ups on file.   Orders:  No orders of the defined types were placed in this encounter.  No orders of the defined types were placed in this encounter.   Imaging: No results found.  PMFS History: Patient Active Problem List   Diagnosis Date Noted  . 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 Diabetes mellitus without complication (HCC)   . S/P cervical spinal fusion 05/02/2018  . Other bilateral secondary osteoarthritis of knee 07/19/2017  . Spondylosis of thoracic region without myelopathy or radiculopathy 07/19/2017  . Midline low back pain without sciatica 04/13/2017  . Obesity   . Essential hypertension   . High cholesterol   . Gout    Past Medical History:  Diagnosis Date  . Acid reflux   . Arthritis    B/L hips  . Diabetes mellitus without complication (HCC)   . Gout   . High cholesterol   . Hypertension   . Obesity   . Pneumonia     Family History  Problem Relation Age of Onset  . Cirrhosis Father   . Stroke Father   . Hypertension Brother     Past Surgical  History:  Procedure Laterality Date  . ANTERIOR CERVICAL DECOMP/DISCECTOMY FUSION N/A 03/17/2018   Procedure: CERVICAL FOUR-FIVE, CERVCAL FIVE-SIX ANTERIOR CERVICAL DECOMPRESSION/DISCECTOMY FUSION, ALLOGRAFT PLATE;  Surgeon: Eldred Manges, MD;  Location: [facility];  Service: Orthopedics;  Laterality: N/A;  . CYSTECTOMY     testicle   . KNEE ARTHROSCOPY     left two knee surgery   . SHOULDER ARTHROSCOPY     one left shoulder   . TONSILLECTOMY    . TOTAL HIP ARTHROPLASTY Left 09/14/2019   Procedure: LEFT TOTAL HIP ARTHROPLASTY -DIRECT ANTERIOR;  Surgeon: Eldred Manges, MD;  Location: [facility];  Service: Orthopedics;  Laterality: Left;   Social History   Occupational History  . Not on file  Tobacco Use  . Smoking status: Never Smoker  . Smokeless tobacco: Never Used  Vaping Use  . Vaping Use: Never used  Substance and Sexual Activity  . Alcohol use: Not Currently  . Drug use: No  . Sexual activity: Yes

## 2019-11-12 ENCOUNTER — Telehealth: Payer: Self-pay

## 2019-11-12 ENCOUNTER — Telehealth: Payer: Medicaid Other | Admitting: Family

## 2019-11-12 DIAGNOSIS — J069 Acute upper respiratory infection, unspecified: Secondary | ICD-10-CM

## 2019-11-12 MED ORDER — FLUTICASONE PROPIONATE 50 MCG/ACT NA SUSP: 2.0000 | Freq: Every day | NASAL | 6 refills | Status: DC

## 2019-11-12 MED ORDER — PROMETHAZINE-DM 6.25-15 MG/5ML PO SYRP: 5.0000 mL | ORAL_SOLUTION | Freq: Three times a day (TID) | ORAL | 0 refills | Status: DC | PRN

## 2019-11-12 MED ORDER — BENZONATATE 100 MG PO CAPS: 100.0000 mg | ORAL_CAPSULE | Freq: Three times a day (TID) | ORAL | 0 refills | Status: DC | PRN

## 2019-11-12 NOTE — Addendum Note (Signed)
Addended by: Jannifer Rodney A on: 11/12/2019 05:15 PM   Modules accepted: Orders

## 2019-11-12 NOTE — Progress Notes (Signed)
We are sorry you are not feeling well.  Here is how we plan to help!  Based on what you have shared with me, it looks like you may have a viral upper respiratory infection.  Upper respiratory infections are caused by a large number of viruses; however, rhinovirus is the most common cause.   However, given your symptoms you do need to be tested for COVID to rule out.   Symptoms vary from person to person, with common symptoms including sore throat, cough, fatigue or lack of energy and feeling of general discomfort.  A low-grade fever of up to 100.4 may present, but is often uncommon.  Symptoms vary however, and are closely related to a person's age or underlying illnesses.  The most common symptoms associated with an upper respiratory infection are nasal discharge or congestion, cough, sneezing, headache and pressure in the ears and face.  These symptoms usually persist for about 3 to 10 days, but can last up to 2 weeks.  It is important to know that upper respiratory infections do not cause serious illness or complications in most cases.    Upper respiratory infections can be transmitted from person to person, with the most common method of transmission being a person's hands.  The virus is able to live on the skin and can infect other persons for up to 2 hours after direct contact.  Also, these can be transmitted when someone coughs or sneezes; thus, it is important to cover the mouth to reduce this risk.  To keep the spread of the illness at bay, good hand hygiene is very important.  This is an infection that is most likely caused by a virus. There are no specific treatments other than to help you with the symptoms until the infection runs its course.  We are sorry you are not feeling well.  Here is how we plan to help!   For nasal congestion, you may use an oral decongestants such as Mucinex D or if you have glaucoma or high blood pressure use plain Mucinex.  Saline nasal spray or nasal drops can  help and can safely be used as often as needed for congestion.  For your congestion, I have prescribed Fluticasone nasal spray one spray in each nostril twice a day  If you do not have a history of heart disease, hypertension, diabetes or thyroid disease, prostate/bladder issues or glaucoma, you may also use Sudafed to treat nasal congestion.  It is highly recommended that you consult with a pharmacist or your primary care physician to ensure this medication is safe for you to take.     If you have a cough, you may use cough suppressants such as Delsym and Robitussin.  If you have glaucoma or high blood pressure, you can also use Coricidin HBP.   For cough I have prescribed for you A prescription cough medication called Tessalon Perles 100 mg. You may take 1-2 capsules every 8 hours as needed for cough  If you have a sore or scratchy throat, use a saltwater gargle-  to  teaspoon of salt dissolved in a 4-ounce to 8-ounce glass of warm water.  Gargle the solution for approximately 15-30 seconds and then spit.  It is important not to swallow the solution.  You can also use throat lozenges/cough drops and Chloraseptic spray to help with throat pain or discomfort.  Warm or cold liquids can also be helpful in relieving throat pain.  For headache, pain or general discomfort, you can use   Ibuprofen or Tylenol as directed.   Some authorities believe that zinc sprays or the use of Echinacea may shorten the course of your symptoms.   HOME CARE . Only take medications as instructed by your medical team. . Be sure to drink plenty of fluids. Water is fine as well as fruit juices, sodas and electrolyte beverages. You may want to stay away from caffeine or alcohol. If you are nauseated, try taking small sips of liquids. How do you know if you are getting enough fluid? Your urine should be a pale yellow or almost colorless. . Get rest. . Taking a steamy shower or using a humidifier may help nasal congestion and  ease sore throat pain. You can place a towel over your head and breathe in the steam from hot water coming from a faucet. . Using a saline nasal spray works much the same way. . Cough drops, hard candies and sore throat lozenges may ease your cough. . Avoid close contacts especially the very young and the elderly . Cover your mouth if you cough or sneeze . Always remember to wash your hands.   GET HELP RIGHT AWAY IF: . You develop worsening fever. . If your symptoms do not improve within 10 days . You develop yellow or green discharge from your nose over 3 days. . You have coughing fits . You develop a severe head ache or visual changes. . You develop shortness of breath, difficulty breathing or start having chest pain . Your symptoms persist after you have completed your treatment plan  MAKE SURE YOU   Understand these instructions.  Will watch your condition.  Will get help right away if you are not doing well or get worse.  Your e-visit answers were reviewed by a board certified advanced clinical practitioner to complete your personal care plan. Depending upon the condition, your plan could have included both over the counter or prescription medications. Please review your pharmacy choice. If there is a problem, you may call our nursing hot line at and have the prescription routed to another pharmacy. Your safety is important to us. If you have drug allergies check your prescription carefully.   You can use MyChart to ask questions about today's visit, request a non-urgent call back, or ask for a work or school excuse for 24 hours related to this e-Visit. If it has been greater than 24 hours you will need to follow up with your provider, or enter a new e-Visit to address those concerns. You will get an e-mail in the next two days asking about your experience.  I hope that your e-visit has been valuable and will speed your recovery. Thank you for using e-visits.     Approximately  5 minutes was spent documenting and reviewing patient's chart.   

## 2019-11-12 NOTE — Telephone Encounter (Signed)
Copied from CRM [id number]. Topic: General - Call Back - No Documentation >> Nov 12, 2019  3:36 PM Randol Kern wrote: Reason for CRM: Pt called reporting cold symptoms, declined offer for appt. He has congestion and drainage. Requesting a call back from a nurse, has questions.  Best contact: [phone]

## 2019-11-13 NOTE — Telephone Encounter (Signed)
Spoke with patient started on mucinex, and sudafed,  said feeling much better , tested for COVID was negative. Less nasal congestion today. Will call the office if symptoms persist or worsen and  f /u with PCP at the scheduled appt.

## 2019-11-19 ENCOUNTER — Other Ambulatory Visit: Payer: Self-pay | Admitting: Nurse Practitioner

## 2019-11-19 DIAGNOSIS — M1A09X0 Idiopathic chronic gout, multiple sites, without tophus (tophi): Secondary | ICD-10-CM

## 2019-11-19 MED FILL — LISINOPRIL-HCTZ 10-12.5 MG: 10-12.5 | 30 days supply | Qty: 30 | Fill #11

## 2019-11-19 MED FILL — ?METFORMIN HCL 500MG TABL: 500 | 30 days supply | Qty: 60 | Fill #3

## 2019-11-19 MED FILL — ?ATORVASTATIN 40MG TABLET: 40 | 30 days supply | Qty: 30 | Fill #8

## 2019-11-19 NOTE — Telephone Encounter (Signed)
Requested medication (s) are due for refill today: yes  Requested medication (s) are on the active medication list: no  Last refill:  08/20/19  Future visit scheduled: yes  Notes to clinic:  med expired   Requested Prescriptions  Pending Prescriptions Disp Refills   allopurinol (ZYLOPRIM) 100 MG tablet [Pharmacy Med Name: ALLOPURINOL 100MG  TABLET 100 Tablet] 60 tablet 0    Sig: TAKE 2 TABLETS (200 MG TOTAL) BY MOUTH DAILY.      Endocrinology:  Gout Agents Failed - 11/19/2019  9:57 AM      Failed - Uric Acid in normal range and within 360 days    Uric Acid  Date Value Ref Range Status  05/24/2018 4.5 3.7 - 8.6 mg/dL Final    Comment:               Therapeutic target for gout patients: <6.0          Passed - Cr in normal range and within 360 days    Creatinine, Ser  Date Value Ref Range Status  09/15/2019 0.79 0.61 - 1.24 mg/dL Final          Passed - Valid encounter within last 12 months    Recent Outpatient Visits           2 months ago Controlled type 2 diabetes mellitus with hyperglycemia, without long-term current use of insulin (HCC)   [place] [facility] [place], [place], NP   5 months ago Essential hypertension   [place] [facility] [place], [place] W, NP   9 months ago Controlled type 2 diabetes mellitus with hyperglycemia, without long-term current use of insulin [facility])   [place] [facility] [place], [place], NP   12 months ago Need for influenza vaccination   [facility] [place], [place], RPH-CPP   12 months ago Controlled type 2 diabetes mellitus with complication, without long-term current use of insulin [facility])    [facility] [place], [place], NP       Future Appointments             In 1 week Shea Stakes, NP [facility]

## 2019-11-20 ENCOUNTER — Other Ambulatory Visit: Payer: Self-pay | Admitting: Family Medicine

## 2019-11-20 MED FILL — ?ALLOPURINOL 100MG TABLET: 100 | 30 days supply | Qty: 60 | Fill #0

## 2019-11-28 ENCOUNTER — Ambulatory Visit: Payer: Medicare Other | Admitting: Nurse Practitioner

## 2019-11-30 ENCOUNTER — Telehealth: Payer: Self-pay | Admitting: Orthopaedic Surgery

## 2019-11-30 ENCOUNTER — Encounter: Payer: Self-pay | Admitting: Orthopaedic Surgery

## 2019-11-30 ENCOUNTER — Other Ambulatory Visit: Payer: Self-pay | Admitting: Orthopaedic Surgery

## 2019-11-30 NOTE — Telephone Encounter (Signed)
Pt states he is a diabetic and he is currently taking valium for his lower back pain and yesterday the medicine his blood pressure drop to 99/69 so he would like a CB to discuss something else he could try for his pain.  [phone]

## 2019-11-30 NOTE — Telephone Encounter (Signed)
I called and discussed. Has appt. Wednesday. FYi

## 2019-12-05 ENCOUNTER — Other Ambulatory Visit: Payer: Self-pay

## 2019-12-05 ENCOUNTER — Ambulatory Visit (INDEPENDENT_AMBULATORY_CARE_PROVIDER_SITE_OTHER): Payer: Medicare Other | Admitting: Orthopaedic Surgery

## 2019-12-05 ENCOUNTER — Encounter: Payer: Self-pay | Admitting: Orthopaedic Surgery

## 2019-12-05 VITALS — Ht 68.0 in | Wt 220.0 lb

## 2019-12-05 DIAGNOSIS — M545 Low back pain, unspecified: Secondary | ICD-10-CM

## 2019-12-06 NOTE — Progress Notes (Signed)
Office Visit Note   Patient: Christopher Hurst.           Date of Birth: 1965-05-23           MRN: [id number] Visit Date: 12/05/2019              Requested by: Claiborne Rigg, NP [address] [place],  [place] [address] PCP: Claiborne Rigg, NP   Assessment & Plan: Visit Diagnoses:  1. Acute bilateral low back pain, unspecified whether sciatica present     Plan: We will set him up with some physical therapy in [place] which is close to his home.  Recheck 5 weeks.  Follow-Up Instructions: Return in about 5 weeks (around 01/09/2020).   Orders:  Orders Placed This Encounter  Procedures  . Ambulatory referral to Physical Therapy   No orders of the defined types were placed in this encounter.     Procedures: No procedures performed   Clinical Data: No additional findings.   Subjective: Chief Complaint  Patient presents with  . Lower Back - Pain    HPI 54 year old male cervical fusion last year two-level doing well as well as left total hip arthroplasty done in August of this year is seen with a new problem with his acute low back pain.  He was getting out of the shower was turning and twisting and felt a pop in his back.  He denies any pain in his hip but states he has been using lidocaine patch Naprosyn is having to use a cane and also was given Valium in the emergency room.  X-rays on 11/28/2019 showed mild lumbar spondylosis with degenerative endplate changes and facet changes without acute fracture.  She has been Press photographer with a cane.  He gets some relief with supine position.  Increased pain with bending turning and twisting no associated bowel or bladder symptoms.  Pain is lumbar not radiating to his feet.  No chills or fever .  Review of Systems pause for gout previous hip arthroplasty cervical fusion.   Objective: Vital Signs: Ht 5\' 8"  (1.727 m)   Wt 220 lb (99.8 kg)   BMI 33.45 kg/m   Physical Exam Constitutional:      Appearance: He is  well-developed.  HENT:     Head: Normocephalic and atraumatic.  Eyes:     Pupils: Pupils are equal, round, and reactive to light.  Neck:     Thyroid: No thyromegaly.     Trachea: No tracheal deviation.  Cardiovascular:     Rate and Rhythm: Normal rate.  Pulmonary:     Effort: Pulmonary effort is normal.     Breath sounds: No wheezing.  Abdominal:     General: Bowel sounds are normal.     Palpations: Abdomen is soft.  Skin:    General: Skin is warm and dry.     Capillary Refill: Capillary refill takes less than 2 seconds.  Neurological:     Mental Status: He is alert and oriented to person, place, and time.  Psychiatric:        Behavior: Behavior normal.        Thought Content: Thought content normal.        Judgment: Judgment normal.     Ortho Exam patient has negative straight leg raising.  Paralumbar tenderness worse on left than right well-healed hip arthroplasty incision.  Anterior tib gastrocsoleus is active.  He has pain with lateral bending and flexing of the lumbar spine. Specialty Comments:  No  specialty comments available.  Imaging: No results found.   PMFS History: Patient Active Problem List   Diagnosis Date Noted  . 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 Diabetes mellitus without complication (HCC)   . S/P cervical spinal fusion 05/02/2018  . Other bilateral secondary osteoarthritis of knee 07/19/2017  . Spondylosis of thoracic region without myelopathy or radiculopathy 07/19/2017  . Midline low back pain without sciatica 04/13/2017  . Obesity   . Essential hypertension   . High cholesterol   . Gout    Past Medical History:  Diagnosis Date  . Acid reflux   . Arthritis    B/L hips  . Diabetes mellitus without complication (HCC)   . Gout   . High cholesterol   . Hypertension   . Obesity   . Pneumonia     Family History  Problem Relation Age of Onset  . Cirrhosis Father   . Stroke Father   . Hypertension Brother     Past Surgical  History:  Procedure Laterality Date  . ANTERIOR CERVICAL DECOMP/DISCECTOMY FUSION N/A 03/17/2018   Procedure: CERVICAL FOUR-FIVE, CERVCAL FIVE-SIX ANTERIOR CERVICAL DECOMPRESSION/DISCECTOMY FUSION, ALLOGRAFT PLATE;  Surgeon: Eldred Manges, MD;  Location: [facility] OR;  Service: Orthopedics;  Laterality: N/A;  . CYSTECTOMY     testicle   . KNEE ARTHROSCOPY     left two knee surgery   . SHOULDER ARTHROSCOPY     one left shoulder   . TONSILLECTOMY    . TOTAL HIP ARTHROPLASTY Left 09/14/2019   Procedure: LEFT TOTAL HIP ARTHROPLASTY -DIRECT ANTERIOR;  Surgeon: Eldred Manges, MD;  Location: [facility] OR;  Service: Orthopedics;  Laterality: Left;   Social History   Occupational History  . Not on file  Tobacco Use  . Smoking status: Never Smoker  . Smokeless tobacco: Never Used  Vaping Use  . Vaping Use: Never used  Substance and Sexual Activity  . Alcohol use: Not Currently  . Drug use: No  . Sexual activity: Yes

## 2019-12-17 MED FILL — ?ALLOPURINOL 100MG TABLET: 100 | 30 days supply | Qty: 60 | Fill #1

## 2019-12-17 MED FILL — ?ATORVASTATIN 40MG TABLET: 40 | 30 days supply | Qty: 30 | Fill #9

## 2019-12-17 MED FILL — LISINOPRIL-HCTZ 10-12.5 MG: 10-12.5 | 30 days supply | Qty: 30 | Fill #0

## 2020-01-18 ENCOUNTER — Other Ambulatory Visit: Payer: Self-pay | Admitting: Nurse Practitioner

## 2020-01-18 ENCOUNTER — Encounter: Payer: Self-pay | Admitting: Nurse Practitioner

## 2020-01-18 DIAGNOSIS — I10 Essential (primary) hypertension: Secondary | ICD-10-CM

## 2020-01-18 MED ORDER — LISINOPRIL-HYDROCHLOROTHIAZIDE 10-12.5 MG PO TABS: 1.0000 | ORAL_TABLET | Freq: Every day | ORAL | 0 refills | Status: DC

## 2020-01-21 ENCOUNTER — Encounter: Payer: Self-pay | Admitting: Nurse Practitioner

## 2020-01-22 ENCOUNTER — Other Ambulatory Visit: Payer: Self-pay | Admitting: Nurse Practitioner

## 2020-01-22 DIAGNOSIS — E785 Hyperlipidemia, unspecified: Secondary | ICD-10-CM

## 2020-01-22 DIAGNOSIS — M1A09X0 Idiopathic chronic gout, multiple sites, without tophus (tophi): Secondary | ICD-10-CM

## 2020-01-22 MED ORDER — ALLOPURINOL 100 MG PO TABS: 200.0000 mg | ORAL_TABLET | Freq: Every day | ORAL | 2 refills | Status: DC

## 2020-01-22 MED ORDER — ATORVASTATIN CALCIUM 40 MG PO TABS: 40.0000 mg | ORAL_TABLET | Freq: Every day | ORAL | 3 refills | Status: DC

## 2020-01-28 ENCOUNTER — Other Ambulatory Visit: Payer: Self-pay | Admitting: Family Medicine

## 2020-01-28 ENCOUNTER — Ambulatory Visit: Payer: Medicare Other | Admitting: Family Medicine

## 2020-01-28 DIAGNOSIS — E7800 Pure hypercholesterolemia, unspecified: Secondary | ICD-10-CM

## 2020-01-28 DIAGNOSIS — Z0000 Encounter for general adult medical examination without abnormal findings: Secondary | ICD-10-CM

## 2020-01-28 DIAGNOSIS — I10 Essential (primary) hypertension: Secondary | ICD-10-CM

## 2020-01-28 DIAGNOSIS — M109 Gout, unspecified: Secondary | ICD-10-CM

## 2020-01-28 DIAGNOSIS — E119 Type 2 diabetes mellitus without complications: Secondary | ICD-10-CM

## 2020-01-29 ENCOUNTER — Telehealth: Payer: Self-pay

## 2020-01-29 ENCOUNTER — Telehealth: Payer: Self-pay | Admitting: Family Medicine

## 2020-01-29 MED ORDER — AZITHROMYCIN 250 MG PO TABS: ORAL_TABLET | ORAL | 0 refills | Status: DC

## 2020-01-29 NOTE — Telephone Encounter (Signed)
Pt called stating he is waiting on covid test results and in the mean time he would like to know if there is anything Dr.Hilts can prescribe to help get his temperature down? The pt has tried tamiflu but that has helped much and he also stated he has a hard time swallowing tablets so he would like the medicine to be in liquid form. Pt would like a CB to be updated on what will be sent in.   [phone]

## 2020-01-29 NOTE — Telephone Encounter (Signed)
Patient  called he will need to reschedule his appt. And his sons Zaine Elsass For labs due to being in quarantine they are currently waiting for results to come back: [phone]

## 2020-01-29 NOTE — Telephone Encounter (Signed)
Would you please advise on the 2 new questions from the patient?

## 2020-01-29 NOTE — Telephone Encounter (Signed)
Rx sent 

## 2020-01-29 NOTE — Telephone Encounter (Signed)
The patient went to [facility] in [place] this morning, after waking up with sinus congestion and fever (100.8) - "feels like a blockage at the bridge of my nose." He had a covid test (PCR), so he is isolating until he gets the results. He was given tamiflu because (per the patient) the provider thought the patient said his wife had the flu over the weekend - he said she "felt like she had the flu." The patient said he does not want to take that if he doesn't have the flu - he thinks he might be headed for a sinus infection. No nasal drainage. No sore throat. He as tried some Flonase and NyQuil. Is there something in Rx form (in either liquid or tablet) he could get for his symptoms (Rx medications are free for him)?

## 2020-01-29 NOTE — Telephone Encounter (Signed)
I called and advised the patient to call me when he is feeling better and we can reschedule the lab visit.

## 2020-01-31 ENCOUNTER — Ambulatory Visit: Payer: Medicare (Managed Care)

## 2020-02-01 ENCOUNTER — Encounter: Payer: Self-pay | Admitting: Family Medicine

## 2020-02-03 ENCOUNTER — Other Ambulatory Visit: Payer: Self-pay | Admitting: Family Medicine

## 2020-02-03 MED ORDER — METHYLPREDNISOLONE 4 MG PO TBPK: ORAL_TABLET | ORAL | 0 refills | Status: DC

## 2020-02-03 NOTE — Addendum Note (Signed)
Addended by: Lillia Carmel on: 02/03/2020 07:11 AM   Modules accepted: Orders

## 2020-02-04 MED FILL — METHYLPREDNISOLONE 4 MG TAB: 4 | 6 days supply | Qty: 21 | Fill #0

## 2020-02-12 ENCOUNTER — Ambulatory Visit (INDEPENDENT_AMBULATORY_CARE_PROVIDER_SITE_OTHER): Payer: Medicare (Managed Care) | Admitting: Family Medicine

## 2020-02-12 ENCOUNTER — Other Ambulatory Visit: Payer: Self-pay

## 2020-02-12 ENCOUNTER — Encounter: Payer: Self-pay | Admitting: Family Medicine

## 2020-02-12 VITALS — BP 124/72 | HR 86 | Ht 68.0 in | Wt 234.6 lb

## 2020-02-12 DIAGNOSIS — E7800 Pure hypercholesterolemia, unspecified: Secondary | ICD-10-CM

## 2020-02-12 DIAGNOSIS — Z6835 Body mass index (BMI) 35.0-35.9, adult: Secondary | ICD-10-CM

## 2020-02-12 DIAGNOSIS — M109 Gout, unspecified: Secondary | ICD-10-CM

## 2020-02-12 DIAGNOSIS — Z0000 Encounter for general adult medical examination without abnormal findings: Secondary | ICD-10-CM

## 2020-02-12 DIAGNOSIS — E6609 Other obesity due to excess calories: Secondary | ICD-10-CM

## 2020-02-12 DIAGNOSIS — E119 Type 2 diabetes mellitus without complications: Secondary | ICD-10-CM | POA: Diagnosis not present

## 2020-02-12 DIAGNOSIS — E1165 Type 2 diabetes mellitus with hyperglycemia: Secondary | ICD-10-CM

## 2020-02-12 DIAGNOSIS — I10 Essential (primary) hypertension: Secondary | ICD-10-CM

## 2020-02-12 MED ORDER — LISINOPRIL-HYDROCHLOROTHIAZIDE 10-12.5 MG PO TABS: 1.0000 | ORAL_TABLET | Freq: Every day | ORAL | 3 refills | Status: DC

## 2020-02-12 MED ORDER — METFORMIN HCL 500 MG PO TABS: 500.0000 mg | ORAL_TABLET | Freq: Every day | ORAL | 1 refills | Status: DC

## 2020-02-12 NOTE — Progress Notes (Signed)
Office Visit Note   Patient: Christopher Hurst.           Date of Birth: 06/27/1965           MRN: [id number] Visit Date: 02/12/2020 Requested by: Claiborne Rigg, NP [address] [place],  [place] [address] PCP: Claiborne Rigg, NP  Subjective: Chief Complaint  Patient presents with  . Other    Establish primary care  . Medication Refill    Lisinopril HCT and Metformin    HPI: He is here to establish care.  He recently recovered from Covid, symptoms started 2 days after getting his vaccination.  He is feeling better now, some residual nasal congestion.  Diabetes has been under good control.  A1c's have been in the 5 range.  Blood pressure is well controlled on lisinopril-hydrochlorothiazide.  He tolerates Lipitor for hyperlipidemia.  He has not had any gout attacks since being on allopurinol 200 mg daily.  He is due for labs today.  Family history was updated.  Most of his family members died at relatively young age.               ROS:   All other systems were reviewed and are negative.  Objective: Vital Signs: BP 124/72   Pulse 86   Ht 5\' 8"  (1.727 m)   Wt 234 lb 9.6 oz (106.4 kg)   BMI 35.67 kg/m   Physical Exam:  General:  Alert and oriented, in no acute distress. Pulm:  Breathing unlabored. Psy:  Normal mood, congruent affect. Skin: No suspicious lesions HEENT:  Casey/AT, PERRLA, EOM Full, no nystagmus.  Funduscopic examination within normal limits.  No conjunctival erythema.  Tympanic membranes are pearly gray with normal landmarks.  External ear canals are normal.  Nasal passages are clear.  Oropharynx is clear.  No significant lymphadenopathy.  No thyromegaly or nodules.  2+ carotid pulses without bruits. CV: Regular rate and rhythm without murmurs, rubs, or gallops.  No peripheral edema.  2+ radial and posterior tibial pulses. Lungs: Clear to auscultation throughout with no wheezing or areas of consolidation. Abdomen: No  hepatosplenomegaly.   Imaging: No results found.  Assessment & Plan: 1.  Visit to establish care  2.  Diabetes, has been well controlled. -Labs drawn today.  Follow-up in 4 to 6 months.  3.  Hypertension, well controlled.  4.  Obesity, working on diet and exercise.  Has made a lot of progress so far.  5.  Hyperlipidemia, tolerating statin.  6.  Gout, under control with allopurinol.     Procedures: No procedures performed        PMFS History: Patient Active Problem List   Diagnosis Date Noted  . 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 Diabetes mellitus without complication (HCC)   . S/P cervical spinal fusion 05/02/2018  . Other bilateral secondary osteoarthritis of knee 07/19/2017  . Spondylosis of thoracic region without myelopathy or radiculopathy 07/19/2017  . Midline low back pain without sciatica 04/13/2017  . Obesity   . Essential hypertension   . High cholesterol   . Gout    Past Medical History:  Diagnosis Date  . Acid reflux   . Arthritis    B/L hips  . Diabetes mellitus without complication (HCC)   . Gout   . High cholesterol   . Hypertension   . Obesity   . Pneumonia     Family History  Problem Relation Age of Onset  . Healthy Mother   .  Cirrhosis Father   . Stroke Father   . Alzheimer's disease Father   . Alcohol abuse Father   . Hypertension Brother     Past Surgical History:  Procedure Laterality Date  . ANTERIOR CERVICAL DECOMP/DISCECTOMY FUSION N/A 03/17/2018   Procedure: CERVICAL FOUR-FIVE, CERVCAL FIVE-SIX ANTERIOR CERVICAL DECOMPRESSION/DISCECTOMY FUSION, ALLOGRAFT PLATE;  Surgeon: Eldred Manges, MD;  Location: [facility] OR;  Service: Orthopedics;  Laterality: N/A;  . CYSTECTOMY     testicle   . KNEE ARTHROSCOPY     left two knee surgery   . SHOULDER ARTHROSCOPY     one left shoulder   . TONSILLECTOMY    . TOTAL HIP ARTHROPLASTY Left 09/14/2019   Procedure: LEFT TOTAL HIP ARTHROPLASTY -DIRECT ANTERIOR;  Surgeon: Eldred Manges, MD;  Location: [facility] OR;  Service: Orthopedics;  Laterality: Left;   Social History   Occupational History  . Not on file  Tobacco Use  . Smoking status: Never Smoker  . Smokeless tobacco: Never Used  Vaping Use  . Vaping Use: Never used  Substance and Sexual Activity  . Alcohol use: Not Currently  . Drug use: No  . Sexual activity: Yes

## 2020-02-13 ENCOUNTER — Encounter: Payer: Self-pay | Admitting: Family Medicine

## 2020-02-13 ENCOUNTER — Telehealth: Payer: Self-pay | Admitting: Family Medicine

## 2020-02-13 LAB — CBC WITH DIFFERENTIAL/PLATELET
Absolute Monocytes: 628 cells/uL (ref 200–950)
Basophils Absolute: 43 cells/uL (ref 0–200)
Basophils Relative: 0.7 %
Eosinophils Absolute: 177 cells/uL (ref 15–500)
Eosinophils Relative: 2.9 %
HCT: 38.1 % — ABNORMAL LOW (ref 38.5–50.0)
Hemoglobin: 12.8 g/dL — ABNORMAL LOW (ref 13.2–17.1)
Lymphs Abs: 2141 cells/uL (ref 850–3900)
MCH: 28.6 pg (ref 27.0–33.0)
MCHC: 33.6 g/dL (ref 32.0–36.0)
MCV: 85 fL (ref 80.0–100.0)
MPV: 11.1 fL (ref 7.5–12.5)
Monocytes Relative: 10.3 %
Neutro Abs: 3111 cells/uL (ref 1500–7800)
Neutrophils Relative %: 51 %
Platelets: 290 10*3/uL (ref 140–400)
RBC: 4.48 10*6/uL (ref 4.20–5.80)
RDW: 14.2 % (ref 11.0–15.0)
Total Lymphocyte: 35.1 %
WBC: 6.1 10*3/uL (ref 3.8–10.8)

## 2020-02-13 LAB — PSA: PSA: 0.73 ng/mL (ref <=4.0)

## 2020-02-13 LAB — COMPREHENSIVE METABOLIC PANEL
AG Ratio: 1.9 (calc) (ref 1.0–2.5)
ALT: 16 U/L (ref 9–46)
AST: 15 U/L (ref 10–35)
Albumin: 4.2 g/dL (ref 3.6–5.1)
Alkaline phosphatase (APISO): 86 U/L (ref 35–144)
BUN: 9 mg/dL (ref 7–25)
CO2: 29 mmol/L (ref 20–32)
Calcium: 9.5 mg/dL (ref 8.6–10.3)
Chloride: 101 mmol/L (ref 98–110)
Creat: 0.71 mg/dL (ref 0.70–1.33)
Globulin: 2.2 g/dL (calc) (ref 1.9–3.7)
Glucose, Bld: 108 mg/dL — ABNORMAL HIGH (ref 65–99)
Potassium: 4 mmol/L (ref 3.5–5.3)
Sodium: 141 mmol/L (ref 135–146)
Total Bilirubin: 0.3 mg/dL (ref 0.2–1.2)
Total Protein: 6.4 g/dL (ref 6.1–8.1)

## 2020-02-13 LAB — THYROID PANEL WITH TSH
Free Thyroxine Index: 2.3 (ref 1.4–3.8)
T3 Uptake: 31 % (ref 22–35)
T4, Total: 7.4 ug/dL (ref 4.9–10.5)
TSH: 0.98 mIU/L (ref 0.40–4.50)

## 2020-02-13 LAB — LIPID PANEL
Cholesterol: 130 mg/dL (ref <200)
HDL: 32 mg/dL — ABNORMAL LOW (ref >=40)
LDL Cholesterol (Calc): 71 mg/dL (calc)
Non-HDL Cholesterol (Calc): 98 mg/dL (calc) (ref <130)
Total CHOL/HDL Ratio: 4.1 (calc) (ref <5.0)
Triglycerides: 196 mg/dL — ABNORMAL HIGH (ref <150)

## 2020-02-13 LAB — HEMOGLOBIN A1C
Hgb A1c MFr Bld: 6.3 % of total Hgb — ABNORMAL HIGH (ref <5.7)
Mean Plasma Glucose: 134 mg/dL
eAG (mmol/L): 7.4 mmol/L

## 2020-02-13 LAB — URIC ACID: Uric Acid, Serum: 4.5 mg/dL (ref 4.0–8.0)

## 2020-02-13 LAB — HIGH SENSITIVITY CRP: hs-CRP: 6.5 mg/L — ABNORMAL HIGH

## 2020-02-13 LAB — VITAMIN D 25 HYDROXY (VIT D DEFICIENCY, FRACTURES): Vit D, 25-Hydroxy: 52 ng/mL (ref 30–100)

## 2020-02-13 NOTE — Telephone Encounter (Signed)
Labs are notable for the following:  Uric acid level looks perfect at 4.5.  C-reactive protein, inflammation marker, is elevated at 6.5.  This is most likely due to recent illness.  Would suggest rechecking this in 3 to 6 months to be sure it is normalizing.  Vitamin D looks good at 51.  Thyroid studies look perfect.  Prostate PSA looks perfect.  Triglycerides are elevated and HDL is low.  In addition, hemoglobin A1c has risen to 6.3.  It is very important to maintain a regular exercise regimen and to minimize dietary intake of breads, pastas, cereals, sugars and sweets.  Recheck in about 6 months.  Hemoglobin and hematocrit are borderline abnormal.  Recheck in about 3 to 6 months.

## 2020-04-10 ENCOUNTER — Encounter: Payer: Self-pay | Admitting: Family Medicine

## 2020-04-10 MED ORDER — BENZONATATE 100 MG PO CAPS: 100.0000 mg | ORAL_CAPSULE | Freq: Three times a day (TID) | ORAL | 1 refills | Status: DC | PRN

## 2020-04-10 MED ORDER — ACETAMINOPHEN-CODEINE 120-12 MG/5ML PO SOLN: 5.0000 mL | Freq: Four times a day (QID) | ORAL | 0 refills | Status: DC | PRN

## 2020-04-10 NOTE — Addendum Note (Signed)
Addended by: Lillia Carmel on: 04/10/2020 11:17 AM   Modules accepted: Orders

## 2020-04-15 ENCOUNTER — Encounter: Payer: Self-pay | Admitting: Family Medicine

## 2020-04-15 DIAGNOSIS — E785 Hyperlipidemia, unspecified: Secondary | ICD-10-CM

## 2020-04-15 DIAGNOSIS — M1A09X0 Idiopathic chronic gout, multiple sites, without tophus (tophi): Secondary | ICD-10-CM

## 2020-04-15 MED ORDER — ATORVASTATIN CALCIUM 40 MG PO TABS: 40.0000 mg | ORAL_TABLET | Freq: Every day | ORAL | 3 refills | Status: AC

## 2020-04-15 MED ORDER — ALLOPURINOL 100 MG PO TABS
200.0000 mg | ORAL_TABLET | Freq: Every day | ORAL | 11 refills | Status: AC
Start: 2020-04-15 — End: 2023-04-03

## 2020-05-16 ENCOUNTER — Encounter: Payer: Self-pay | Admitting: Family Medicine

## 2020-05-16 MED ORDER — AZITHROMYCIN 200 MG/5ML PO SUSR: ORAL | 0 refills | Status: DC

## 2020-05-16 MED ORDER — NEOMYCIN-POLYMYXIN-HC 3.5-10000-1 OT SOLN: 4.0000 [drp] | Freq: Four times a day (QID) | OTIC | 0 refills | Status: DC

## 2020-05-16 NOTE — Addendum Note (Signed)
Addended by: Lillia Carmel on: 05/16/2020 04:50 PM   Modules accepted: Orders

## 2020-05-21 ENCOUNTER — Encounter: Payer: Self-pay | Admitting: Family Medicine

## 2020-06-11 ENCOUNTER — Other Ambulatory Visit: Payer: Self-pay

## 2020-06-11 ENCOUNTER — Ambulatory Visit (INDEPENDENT_AMBULATORY_CARE_PROVIDER_SITE_OTHER): Payer: Medicare (Managed Care) | Admitting: Family Medicine

## 2020-06-11 ENCOUNTER — Encounter: Payer: Self-pay | Admitting: Family Medicine

## 2020-06-11 ENCOUNTER — Other Ambulatory Visit: Payer: Self-pay | Admitting: Family Medicine

## 2020-06-11 VITALS — BP 142/79 | HR 72 | Ht 68.0 in | Wt 237.4 lb

## 2020-06-11 DIAGNOSIS — I10 Essential (primary) hypertension: Secondary | ICD-10-CM

## 2020-06-11 DIAGNOSIS — E119 Type 2 diabetes mellitus without complications: Secondary | ICD-10-CM

## 2020-06-11 DIAGNOSIS — F418 Other specified anxiety disorders: Secondary | ICD-10-CM

## 2020-06-11 DIAGNOSIS — R7982 Elevated C-reactive protein (CRP): Secondary | ICD-10-CM | POA: Diagnosis not present

## 2020-06-11 DIAGNOSIS — E785 Hyperlipidemia, unspecified: Secondary | ICD-10-CM

## 2020-06-11 DIAGNOSIS — G8929 Other chronic pain: Secondary | ICD-10-CM

## 2020-06-11 DIAGNOSIS — M545 Low back pain, unspecified: Secondary | ICD-10-CM

## 2020-06-11 DIAGNOSIS — Z0000 Encounter for general adult medical examination without abnormal findings: Secondary | ICD-10-CM

## 2020-06-11 MED ORDER — ESCITALOPRAM OXALATE 5 MG PO TABS: 5.0000 mg | ORAL_TABLET | Freq: Every day | ORAL | 6 refills | Status: DC

## 2020-06-11 NOTE — Progress Notes (Signed)
Office Visit Note   Patient: Christopher Hurst.           Date of Birth: Feb 27, 1965           MRN: [id number] Visit Date: 06/11/2020 Requested by: Claiborne Rigg, NP [address] [place],  [place] [address] PCP: Claiborne Rigg, NP  Subjective: Chief Complaint  Patient presents with  . Other    Follow up on htn, dm, lipids, bloodwork.     HPI: He is here for monitoring of medical conditions.  He is still trying to watch what he eats, but he has not been losing weight like he hoped.  He is exercising about 7000 steps per day.  He has chronic low back pain.  He saw a specialist recently and was told he had severe kyphosis.  He was told that there was a chance he could be in a wheelchair 1 day.  He was told that surgery was not an option for him right now and that he should do physical therapy and if that did not help, then injections.  He is very anxious this morning about all of this.  Blood pressure has generally been well controlled.  He is due for monitoring of A1c, and to recheck his elevated C-reactive protein.              ROS:   All other systems were reviewed and are negative.  Objective: Vital Signs: BP (!) 142/79   Pulse 72   Ht 5\' 8"  (1.727 m)   Wt 237 lb 6.4 oz (107.7 kg)   BMI 36.10 kg/m   Physical Exam:  General:  Alert and oriented, in no acute distress. Pulm:  Breathing unlabored. Psy:  Normal mood, congruent affect.  CV: Regular rate and rhythm without murmurs, rubs, or gallops.  No peripheral edema.  2+ radial and posterior tibial pulses. Lungs: Clear to auscultation throughout with no wheezing or areas of consolidation.    Imaging: No results found.  Assessment & Plan: 1.  Diabetes -Recheck A1c today.  Follow-up in 6 months.  2.  Hypertension, elevated today but probably due to anxiousness -No changes for now.  3.  Anxiety with depression -We will start Lexapro 5 mg daily.  4.  Chronic back pain with moderate kyphosis based on x-rays  he showed me on his phone -Physical therapy, could contemplate a kyphosis back brace or lumbar injections.  Encouraged him to work on posture.  5.  Elevated CRP - Recheck today.     Procedures: No procedures performed        PMFS History: Patient Active Problem List   Diagnosis Date Noted  . 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 Diabetes mellitus without complication (HCC)   . S/P cervical spinal fusion 05/02/2018  . Other bilateral secondary osteoarthritis of knee 07/19/2017  . Spondylosis of thoracic region without myelopathy or radiculopathy 07/19/2017  . Midline low back pain without sciatica 04/13/2017  . Obesity   . Essential hypertension   . High cholesterol   . Gout    Past Medical History:  Diagnosis Date  . Acid reflux   . Arthritis    B/L hips  . Diabetes mellitus without complication (HCC)   . Gout   . High cholesterol   . Hypertension   . Obesity   . Pneumonia     Family History  Problem Relation Age of Onset  . Healthy Mother   . Cirrhosis Father   . Stroke  Father   . Alzheimer's disease Father   . Alcohol abuse Father   . Hypertension Brother     Past Surgical History:  Procedure Laterality Date  . ANTERIOR CERVICAL DECOMP/DISCECTOMY FUSION N/A 03/17/2018   Procedure: CERVICAL FOUR-FIVE, CERVCAL FIVE-SIX ANTERIOR CERVICAL DECOMPRESSION/DISCECTOMY FUSION, ALLOGRAFT PLATE;  Surgeon: Eldred Manges, MD;  Location: [facility] OR;  Service: Orthopedics;  Laterality: N/A;  . CYSTECTOMY     testicle   . KNEE ARTHROSCOPY     left two knee surgery   . SHOULDER ARTHROSCOPY     one left shoulder   . TONSILLECTOMY    . TOTAL HIP ARTHROPLASTY Left 09/14/2019   Procedure: LEFT TOTAL HIP ARTHROPLASTY -DIRECT ANTERIOR;  Surgeon: Eldred Manges, MD;  Location: [facility] OR;  Service: Orthopedics;  Laterality: Left;   Social History   Occupational History  . Not on file  Tobacco Use  . Smoking status: Never Smoker  . Smokeless tobacco: Never Used  Vaping Use   . Vaping Use: Never used  Substance and Sexual Activity  . Alcohol use: Not Currently  . Drug use: No  . Sexual activity: Yes

## 2020-06-12 ENCOUNTER — Encounter: Payer: Self-pay | Admitting: Family Medicine

## 2020-06-12 ENCOUNTER — Telehealth: Payer: Self-pay | Admitting: Family Medicine

## 2020-06-12 LAB — BASIC METABOLIC PANEL
BUN: 8 mg/dL (ref 7–25)
CO2: 30 mmol/L (ref 20–32)
Calcium: 9.9 mg/dL (ref 8.6–10.3)
Chloride: 102 mmol/L (ref 98–110)
Creat: 0.79 mg/dL (ref 0.70–1.33)
Glucose, Bld: 122 mg/dL — ABNORMAL HIGH (ref 65–99)
Potassium: 4 mmol/L (ref 3.5–5.3)
Sodium: 140 mmol/L (ref 135–146)

## 2020-06-12 LAB — CBC WITH DIFFERENTIAL/PLATELET
Absolute Monocytes: 482 cells/uL (ref 200–950)
Basophils Absolute: 50 cells/uL (ref 0–200)
Basophils Relative: 1.1 %
Eosinophils Absolute: 171 cells/uL (ref 15–500)
Eosinophils Relative: 3.8 %
HCT: 40.3 % (ref 38.5–50.0)
Hemoglobin: 13 g/dL — ABNORMAL LOW (ref 13.2–17.1)
Lymphs Abs: 1814 cells/uL (ref 850–3900)
MCH: 28.1 pg (ref 27.0–33.0)
MCHC: 32.3 g/dL (ref 32.0–36.0)
MCV: 87 fL (ref 80.0–100.0)
MPV: 10.7 fL (ref 7.5–12.5)
Monocytes Relative: 10.7 %
Neutro Abs: 1985 cells/uL (ref 1500–7800)
Neutrophils Relative %: 44.1 %
Platelets: 274 10*3/uL (ref 140–400)
RBC: 4.63 10*6/uL (ref 4.20–5.80)
RDW: 13.1 % (ref 11.0–15.0)
Total Lymphocyte: 40.3 %
WBC: 4.5 10*3/uL (ref 3.8–10.8)

## 2020-06-12 LAB — HEMOGLOBIN A1C
Hgb A1c MFr Bld: 6.1 % of total Hgb — ABNORMAL HIGH (ref <5.7)
Mean Plasma Glucose: 128 mg/dL
eAG (mmol/L): 7.1 mmol/L

## 2020-06-12 LAB — C-REACTIVE PROTEIN: CRP: 3.7 mg/L (ref <8.0)

## 2020-06-12 NOTE — Telephone Encounter (Signed)
Labs show:   Glucose is still in prediabetes range.  A1C is heading in the right direction at 6.1.  CRP is normal.    All else looking good!  Recheck glucose/A1C in about 4-6 months.

## 2020-06-21 ENCOUNTER — Encounter: Payer: Self-pay | Admitting: Family Medicine

## 2020-07-09 ENCOUNTER — Encounter: Payer: Self-pay | Admitting: Family Medicine

## 2020-08-13 ENCOUNTER — Other Ambulatory Visit: Payer: Self-pay | Admitting: Family Medicine

## 2020-08-13 DIAGNOSIS — E1165 Type 2 diabetes mellitus with hyperglycemia: Secondary | ICD-10-CM

## 2020-08-14 ENCOUNTER — Telehealth: Payer: Self-pay

## 2020-08-14 NOTE — Telephone Encounter (Signed)
Pt called and would like a refill on his metformin.  Pt stated he needed to pick it up today, please advise

## 2020-08-14 NOTE — Telephone Encounter (Signed)
Please advise 

## 2020-08-14 NOTE — Telephone Encounter (Signed)
Call pt back at [phone]

## 2020-09-16 ENCOUNTER — Other Ambulatory Visit: Payer: Self-pay

## 2020-09-16 DIAGNOSIS — M4807 Spinal stenosis, lumbosacral region: Secondary | ICD-10-CM

## 2020-09-16 MED ORDER — DIAZEPAM 5 MG PO TABS: ORAL_TABLET | ORAL | 0 refills | Status: DC

## 2020-10-06 ENCOUNTER — Other Ambulatory Visit: Payer: Medicare (Managed Care)

## 2021-02-17 ENCOUNTER — Other Ambulatory Visit: Payer: Self-pay

## 2021-02-17 ENCOUNTER — Ambulatory Visit (INDEPENDENT_AMBULATORY_CARE_PROVIDER_SITE_OTHER): Payer: Medicare PPO

## 2021-02-17 ENCOUNTER — Encounter: Payer: Self-pay | Admitting: Orthopaedic Surgery

## 2021-02-17 ENCOUNTER — Ambulatory Visit (INDEPENDENT_AMBULATORY_CARE_PROVIDER_SITE_OTHER): Payer: Medicare PPO | Admitting: Orthopaedic Surgery

## 2021-02-17 VITALS — BP 138/89 | HR 97 | Ht 68.0 in | Wt 242.2 lb

## 2021-02-17 DIAGNOSIS — Z96642 Presence of left artificial hip joint: Secondary | ICD-10-CM | POA: Diagnosis not present

## 2021-02-17 DIAGNOSIS — M25551 Pain in right hip: Secondary | ICD-10-CM

## 2021-02-17 DIAGNOSIS — M1611 Unilateral primary osteoarthritis, right hip: Secondary | ICD-10-CM

## 2021-02-17 NOTE — Progress Notes (Signed)
Office Visit Note   Patient: Christopher Hurst.           Date of Birth: [birth year]           MRN: [id number] Visit Date: 02/17/2021              Requested by: Lavada Mesi, MD [address] [place],  [place] [address] PCP: Lavada Mesi, MD   Assessment & Plan: Visit Diagnoses:  1. Pain in right hip   2. S/P total left hip arthroplasty   3. Unilateral primary osteoarthritis, right hip     Plan: Patient like to proceed with total up arthroplasty on the right.  We discussed spinal anesthesia, overnight stay, therapy wise in the hospital, use of a walker.  Patient's very happy with the results of his left total hip arthroplasty.  Procedure discussed.  He has a 52 mm acetabular cup and only a size 3 Actis stem due to very tight canal and thick cortices in the femur bilaterally.  He will continue to use a cane for fall prevention questions elicited and answered decision for surgery made.  Follow-Up Instructions: Return in about 1 day (around 02/18/2021).   Orders:  Orders Placed This Encounter  Procedures   XR HIP UNILAT W OR W/O PELVIS 2-3 VIEWS RIGHT   No orders of the defined types were placed in this encounter.     Procedures: No procedures performed   Clinical Data: No additional findings.   Subjective: Chief Complaint  Patient presents with   Right Hip - Pain    HPI 56 year old male returns post left total hip arthroplasty 2 years ago .  Patient has increased right groin pain that radiates to the mid thigh.  Pain when he tries to sleep.  Instead use a cane or walker to ambulate due to progressive right hip pain and states "it is time I get my right hip replaced.  He does have some diabetes of bilateral foot numbness.  History of gout uric acid 4.7 on allopurinol.  A1c 6.3 with good control.  Review of Systems previous cervical fusion, left total hip arthroplasty.  History of gout hypertension.  Negative for stroke or MI.  Left total arthroplasty  09/14/2019.   Objective: Vital Signs: BP 138/89    Pulse 97    Ht 5\' 8"  (1.727 m)    Wt 242 lb 3.2 oz (109.9 kg)    BMI 36.83 kg/m   Physical Exam Constitutional:      Appearance: He is well-developed.  HENT:     Head: Normocephalic and atraumatic.     Right Ear: External ear normal.     Left Ear: External ear normal.  Eyes:     Pupils: Pupils are equal, round, and reactive to light.  Neck:     Thyroid: No thyromegaly.     Trachea: No tracheal deviation.  Cardiovascular:     Rate and Rhythm: Normal rate.  Pulmonary:     Effort: Pulmonary effort is normal.     Breath sounds: No wheezing.  Abdominal:     General: Bowel sounds are normal.     Palpations: Abdomen is soft.  Musculoskeletal:     Cervical back: Neck supple.  Skin:    General: Skin is warm and dry.     Capillary Refill: Capillary refill takes less than 2 seconds.  Neurological:     Mental Status: He is alert and oriented to person, place, and time.  Psychiatric:  Behavior: Behavior normal.        Thought Content: Thought content normal.        Judgment: Judgment normal.    Ortho Exam sharp right groin pain with internal rotation right hip only 10 degrees.  External rotation limited to 50 degrees.  Distal pulses palpable knees reach full extension.  No plantar foot lesion some decrease sensation stocking distribution both right and left feet.  Specialty Comments:  No specialty comments available.  Imaging: No results found.   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/24/2019   Diabetes mellitus without complication (HCC)    S/P cervical spinal fusion 05/02/2018   Other bilateral secondary osteoarthritis of knee 07/19/2017   Spondylosis of thoracic region without myelopathy or radiculopathy 07/19/2017   Midline low back pain without sciatica 04/13/2017   Obesity    Essential  hypertension    High cholesterol    Gout    Past Medical History:  Diagnosis Date   Acid reflux    Arthritis    B/L hips   Diabetes mellitus without complication (HCC)    Gout    High cholesterol    Hypertension    Obesity    Pneumonia     Family History  Problem Relation Age of Onset   Healthy Mother    Cirrhosis Father    Stroke Father    Alzheimer's disease Father    Alcohol abuse Father    Hypertension Brother     Past Surgical History:  Procedure Laterality Date   ANTERIOR CERVICAL DECOMP/DISCECTOMY FUSION N/A 03/17/2018   Procedure: CERVICAL FOUR-FIVE, CERVCAL FIVE-SIX ANTERIOR CERVICAL DECOMPRESSION/DISCECTOMY FUSION, ALLOGRAFT PLATE;  Surgeon: Eldred Manges, MD;  Location: [facility] OR;  Service: Orthopedics;  Laterality: N/A;   CYSTECTOMY     testicle    KNEE ARTHROSCOPY     left two knee surgery    SHOULDER ARTHROSCOPY     one left shoulder    TONSILLECTOMY     TOTAL HIP ARTHROPLASTY Left 09/14/2019   Procedure: LEFT TOTAL HIP ARTHROPLASTY -DIRECT ANTERIOR;  Surgeon: Eldred Manges, MD;  Location: [facility];  Service: Orthopedics;  Laterality: Left;   Social History   Occupational History   Not on file  Tobacco Use   Smoking status: Never   Smokeless tobacco: Never  Vaping Use   Vaping Use: Never used  Substance and Sexual Activity   Alcohol use: Not Currently   Drug use: No   Sexual activity: Yes

## 2021-03-02 ENCOUNTER — Other Ambulatory Visit: Payer: Self-pay

## 2021-03-23 IMAGING — CR DG CHEST 2V
2 series · 2 of 2 positions shown · non-contrast
Comparison: 05/06/2019

CLINICAL DATA: Hypertension, diabetes

EXAM:
CHEST - 2 VIEW

[w chest pa]
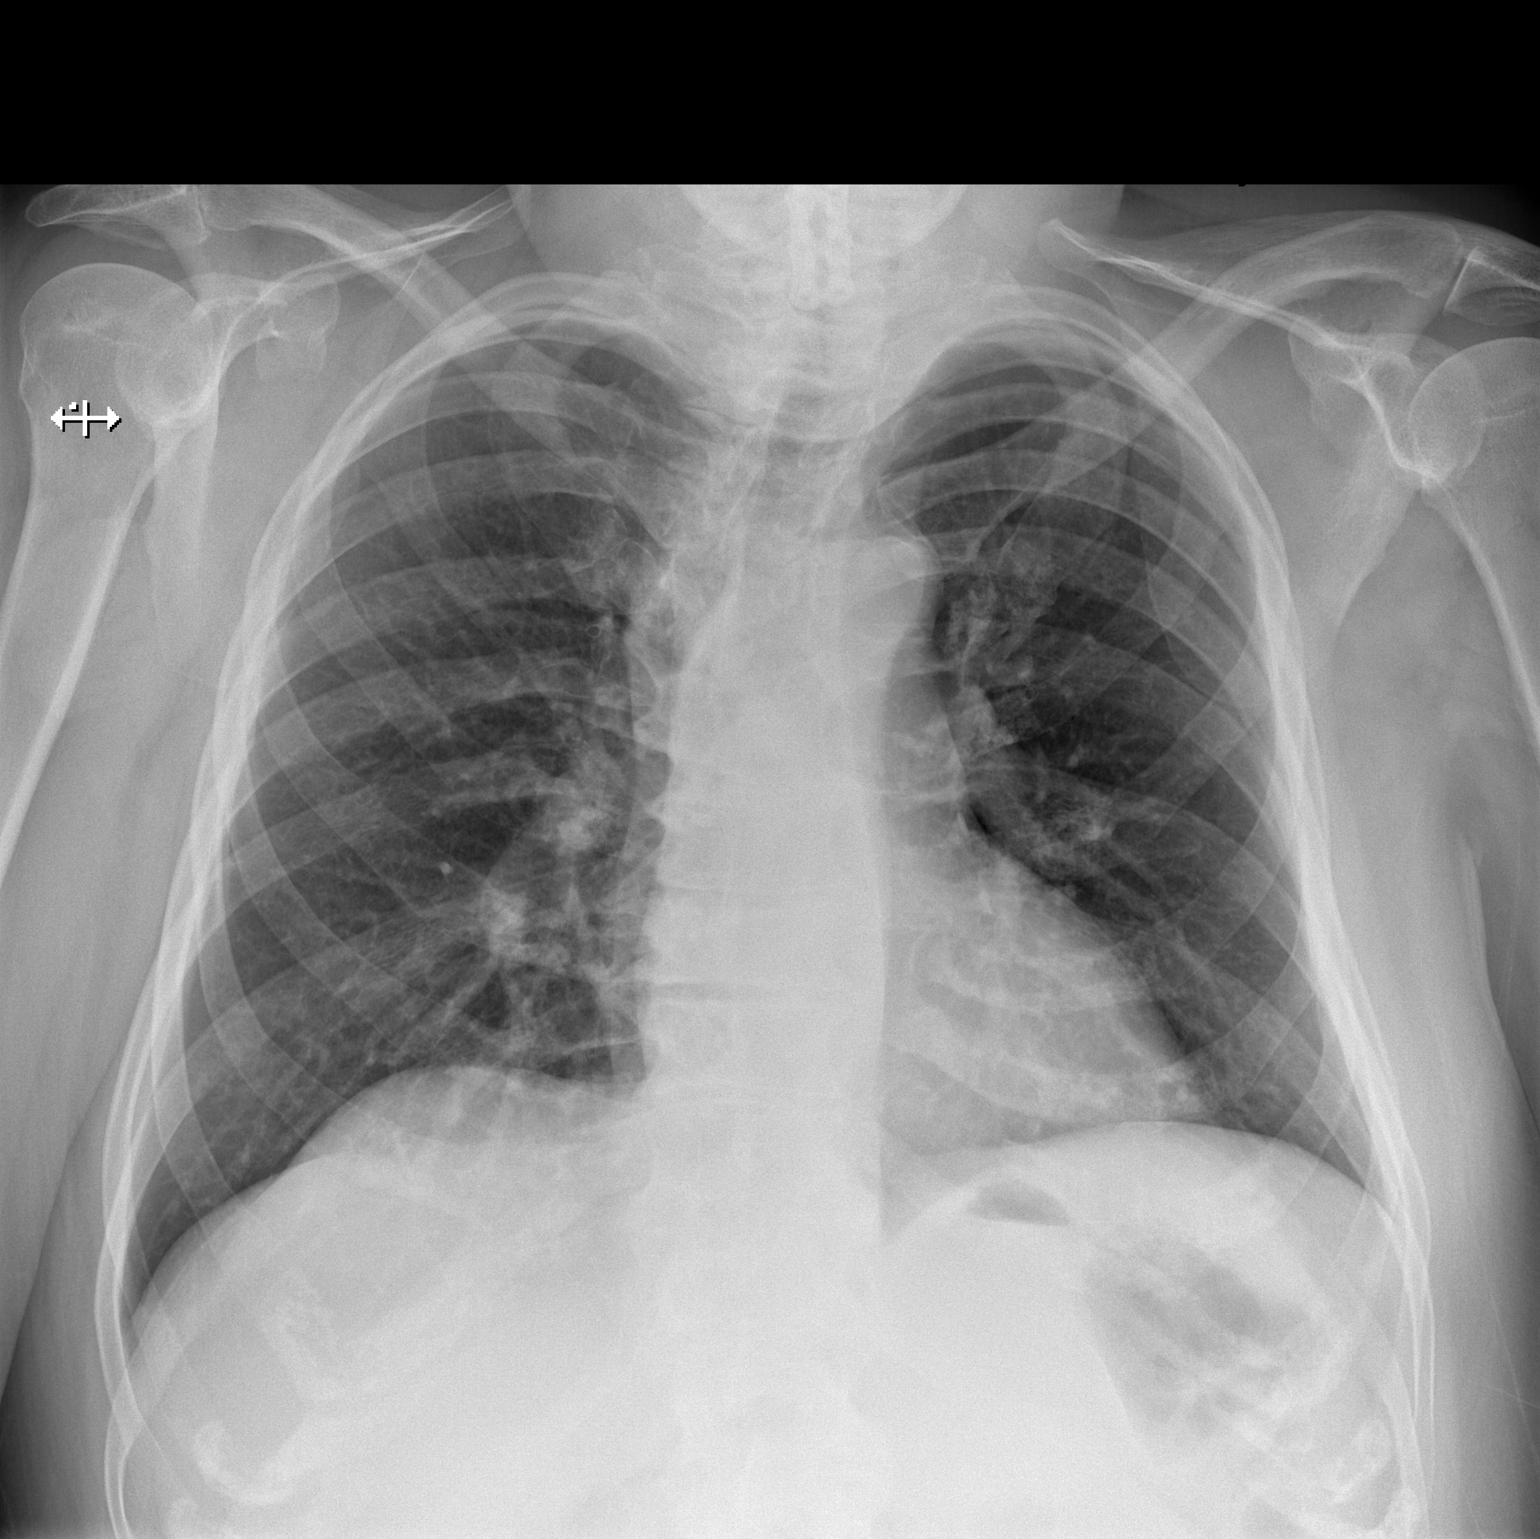

[w chest lat]
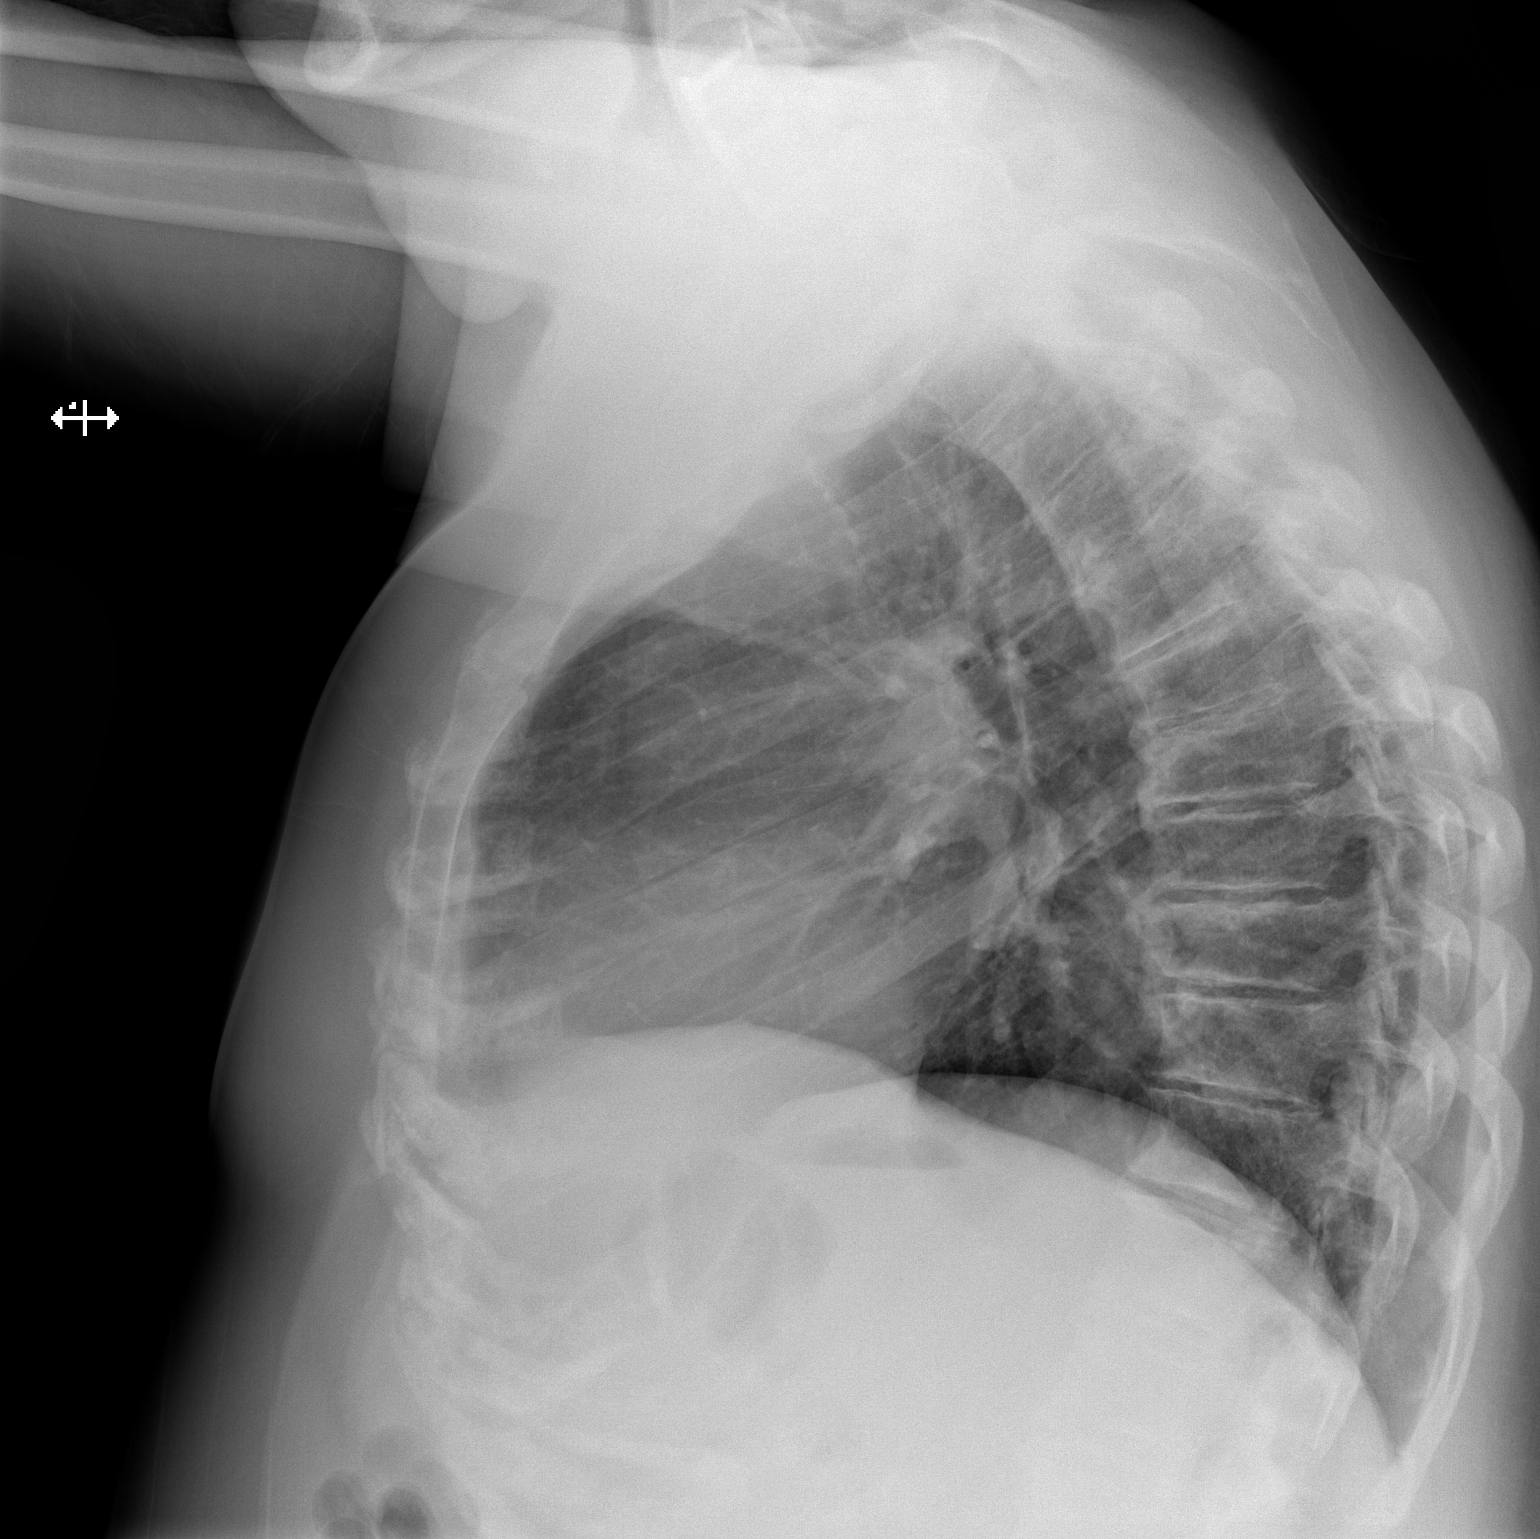

[2 of 2 positions shown; findings below may reference images not displayed]

FINDINGS: The heart size and mediastinal contours are within normal limits. No
focal airspace consolidation, pleural effusion, or pneumothorax.
Multilevel degenerative changes within the thoracic spine. Partially
visualized cervical ACDF hardware.
IMPRESSION: No active cardiopulmonary disease.

## 2021-04-02 ENCOUNTER — Ambulatory Visit (INDEPENDENT_AMBULATORY_CARE_PROVIDER_SITE_OTHER): Payer: Medicare PPO | Admitting: Surgery

## 2021-04-02 ENCOUNTER — Encounter: Payer: Self-pay | Admitting: Surgery

## 2021-04-02 ENCOUNTER — Other Ambulatory Visit: Payer: Self-pay

## 2021-04-02 VITALS — BP 166/91 | HR 66 | Ht 68.0 in | Wt 242.2 lb

## 2021-04-02 DIAGNOSIS — M1611 Unilateral primary osteoarthritis, right hip: Secondary | ICD-10-CM

## 2021-04-02 NOTE — Progress Notes (Signed)
56 year old white male history of end-stage DJD right hip and pain comes in for preop evaluation.  Surgery scheduled for April 10, 2021.  Symptoms unchanged from previous visit and he is wanting to proceed with right total hip replacement as scheduled.  Today history and physical performed.  Review of systems negative.  Patient was seen by his primary care provider Dr. Ginette Otto nodal about a week ago for sinus/nasal congestion.  Adjustments were also made to patient's blood pressure medication since he was hypertensive.  PCP wants to see patient back next week before his surgery for recheck.  Surgical procedure discussed.  Patient was given a medical clearance form to take to his physician next week.  All questions answered. ?

## 2021-04-06 NOTE — Progress Notes (Signed)
Surgical Instructions ? ? ? Your procedure is scheduled on April 10, 2021. ? Report to [facility] Main Entrance "A" at 5:30 A.M., then check in with the Admitting office. ? Call this number if you have problems the morning of surgery: ? [phone] ? ? If you have any questions prior to your surgery date call [phone]: Open Monday-Friday 8am-4pm ? ? ? Remember: ? Do not eat after midnight the night before your surgery ? ?You may drink clear liquids until 4:30am the morning of your surgery.   ?Clear liquids allowed are: Water, Non-Citrus Juices (without pulp), Carbonated Beverages, Clear Tea, Black Coffee ONLY (NO MILK, CREAM OR POWDERED CREAMER of any kind), and Gatorade ?  ? Take these medicines the morning of surgery with A SIP OF WATER:  ?Allopurinol (Zyloprim) ?Atenolol (Tenormin) ?Fluticasone (Flonase) ?Loratadine (Claritin) ?Omeprazole (Prilosec) ? ?As of today, STOP taking any Aspirin (unless otherwise instructed by your surgeon) Aleve, Naproxen, Ibuprofen, Motrin, Advil, Goody's, BC's, all herbal medications, fish oil, and all vitamins. ? ?WHAT DO I DO ABOUT MY DIABETES MEDICATION? ? ? ?Do not take oral diabetes medicines (pills) the morning of surgery. DO NOT TAKE METFORMIN (GLUCOPHAGE) the morning of surgery. ? ? ?HOW TO MANAGE YOUR DIABETES ?BEFORE AND AFTER SURGERY ? ?Why is it important to control my blood sugar before and after surgery? ?Improving blood sugar levels before and after surgery helps healing and can limit problems. ?A way of improving blood sugar control is eating a healthy diet by: ? Eating less sugar and carbohydrates ? Increasing activity/exercise ? Talking with your doctor about reaching your blood sugar goals ?High blood sugars (greater than 180 mg/dL) can raise your risk of infections and slow your recovery, so you will need to focus on controlling your diabetes during the weeks before surgery. ?Make sure that the doctor who takes care of your diabetes knows about your planned  surgery including the date and location. ? ?How do I manage my blood sugar before surgery? ?Check your blood sugar at least 4 times a day, starting 2 days before surgery, to make sure that the level is not too high or low. ? ?Check your blood sugar the morning of your surgery when you wake up and every 2 hours until you get to the Short Stay unit. ? ?If your blood sugar is less than 70 mg/dL, you will need to treat for low blood sugar: ?Do not take insulin. ?Treat a low blood sugar (less than 70 mg/dL) with ? cup of clear juice (cranberry or apple), 4 glucose tablets, OR glucose gel. ?Recheck blood sugar in 15 minutes after treatment (to make sure it is greater than 70 mg/dL). If your blood sugar is not greater than 70 mg/dL on recheck, call [phone] for further instructions. ?Report your blood sugar to the short stay nurse when you get to Short Stay. ? ?If you are admitted to the hospital after surgery: ?Your blood sugar will be checked by the staff and you will probably be given insulin after surgery (instead of oral diabetes medicines) to make sure you have good blood sugar levels. ?The goal for blood sugar control after surgery is 80-180 mg/dL. ? ? ?         ?Do not wear jewelry  ?Do not wear lotions, powders, colognes, or deodorant. ?Do not shave 48 hours prior to surgery.  Men may shave face and neck. ?Do not bring valuables to the hospital. ? ? ?[facility] is not responsible for any belongings or  valuables. .  ? ?Do NOT Smoke (Tobacco/Vaping)  24 hours prior to your procedure ? ?If you use a CPAP at night, you may bring your mask for your overnight stay. ?  ?Contacts, glasses, hearing aids, dentures or partials may not be worn into surgery, please bring cases for these belongings ?  ?For patients admitted to the hospital, discharge time will be determined by your treatment team. ?  ?Patients discharged the day of surgery will not be allowed to drive home, and someone needs to stay with them for 24  hours. ? ?NO VISITORS WILL BE ALLOWED IN PRE-OP WHERE PATIENTS ARE PREPPED FOR SURGERY.  ONLY 1 SUPPORT PERSON MAY BE PRESENT IN THE WAITING ROOM WHILE YOU ARE IN SURGERY.  IF YOU ARE TO BE ADMITTED, ONCE YOU ARE IN YOUR ROOM YOU WILL BE ALLOWED TWO (2) VISITORS. 1 (ONE) VISITOR MAY STAY OVERNIGHT BUT MUST ARRIVE TO THE ROOM BY 8pm.  Minor children may have two parents present. Special consideration for safety and communication needs will be reviewed on a case by case basis. ? ?Special instructions:   ? ?Oral Hygiene is also important to reduce your risk of infection.  Remember - BRUSH YOUR TEETH THE MORNING OF SURGERY WITH YOUR REGULAR TOOTHPASTE ? ? ?[facility]- Preparing For Surgery ? ?Before surgery, you can play an important role. Because skin is not sterile, your skin needs to be as free of germs as possible. You can reduce the number of germs on your skin by washing with CHG (chlorahexidine gluconate) Soap before surgery.  CHG is an antiseptic cleaner which kills germs and bonds with the skin to continue killing germs even after washing.   ? ? ?Please do not use if you have an allergy to CHG or antibacterial soaps. If your skin becomes reddened/irritated stop using the CHG.  ?Do not shave (including legs and underarms) for at least 48 hours prior to first CHG shower. It is OK to shave your face. ? ?Please follow these instructions carefully. ?  ? ? Shower the NIGHT BEFORE SURGERY and the MORNING OF SURGERY with CHG Soap.  ? If you chose to wash your hair, wash your hair first as usual with your normal shampoo. After you shampoo, rinse your hair and body thoroughly to remove the shampoo.  Then [employer]ucor CorporationWash Face and genitals (private parts) with your normal soap and rinse thoroughly to remove soap. ? ?After that Use CHG Soap as you would any other liquid soap. You can apply CHG directly to the skin and wash gently with a scrungie or a clean washcloth.  ? ?Apply the CHG Soap to your body ONLY FROM THE NECK DOWN.  Do  not use on open wounds or open sores. Avoid contact with your eyes, ears, mouth and genitals (private parts). Wash Face and genitals (private parts)  with your normal soap.  ? ?Wash thoroughly, paying special attention to the area where your surgery will be performed. ? ?Thoroughly rinse your body with warm water from the neck down. ? ?DO NOT shower/wash with your normal soap after using and rinsing off the CHG Soap. ? ?Pat yourself dry with a CLEAN TOWEL. ? ?Wear CLEAN PAJAMAS to bed the night before surgery ? ?Place CLEAN SHEETS on your bed the night before your surgery ? ?DO NOT SLEEP WITH PETS. ? ? ?Day of Surgery: ? ?Take a shower with CHG soap. ?Wear Clean/Comfortable clothing the morning of surgery ?Do not apply any deodorants/lotions.   ?Remember to brush  your teeth WITH YOUR REGULAR TOOTHPASTE. ? ? ? ?BEFORE SURGERY: ? ?Notify your provider: ?if you are in close contact with someone who has COVID  ?or if you develop a fever of 100.4 or greater, sneezing, cough, sore throat, shortness of breath or body aches. ? ?  ?Please read over the following fact sheets that you were given.  ? ?

## 2021-04-07 ENCOUNTER — Other Ambulatory Visit: Payer: Self-pay

## 2021-04-07 ENCOUNTER — Encounter (HOSPITAL_COMMUNITY): Payer: Self-pay | Admitting: Physician Assistant

## 2021-04-07 ENCOUNTER — Encounter (HOSPITAL_COMMUNITY): Payer: Self-pay

## 2021-04-07 ENCOUNTER — Encounter (HOSPITAL_COMMUNITY)
Admission: RE | Admit: 2021-04-07 | Discharge: 2021-04-07 | Disposition: A | Discharge status: Home / Self Care | Payer: Medicare PPO | Source: Ambulatory Visit | Attending: Orthopaedic Surgery | Admitting: Orthopaedic Surgery

## 2021-04-07 DIAGNOSIS — Z01818 Encounter for other preprocedural examination: Secondary | ICD-10-CM | POA: Diagnosis present

## 2021-04-07 HISTORY — DX: Anxiety disorder, unspecified: F41.9

## 2021-04-07 HISTORY — DX: Allergy, unspecified, initial encounter: T78.40XA

## 2021-04-07 LAB — TYPE AND SCREEN
ABO/RH(D): B POS
Antibody Screen: NEGATIVE

## 2021-04-07 LAB — SURGICAL PCR SCREEN
MRSA, PCR: NEGATIVE
Staphylococcus aureus: NEGATIVE

## 2021-04-07 LAB — GLUCOSE, CAPILLARY: Glucose-Capillary: 97 mg/dL (ref 70–99)

## 2021-04-07 NOTE — Progress Notes (Signed)
Surgical Instructions ? ? ? Your procedure is scheduled on April 10, 2021. ? Report to [facility] Main Entrance "A" at 5:30 A.M., then check in with the Admitting office. ? Call this number if you have problems the morning of surgery: ? [phone] ? ? If you have any questions prior to your surgery date call [phone]: Open Monday-Friday 8am-4pm ? ? ? Remember: ? Do not eat after midnight the night before your surgery ? ?You may drink clear liquids until 4:30am the morning of your surgery.   ?Clear liquids allowed are: Water, Non-Citrus Juices (without pulp), Carbonated Beverages, Clear Tea, Black Coffee ONLY (NO MILK, CREAM OR POWDERED CREAMER of any kind), and Gatorade ? ?Patient Instructions ? ?The night before surgery:  ?No food after midnight. ONLY clear liquids after midnight ? ?The day of surgery (if you have diabetes): ?Drink ONE (1) 12 oz G2 given to you in your pre admission testing appointment by 4:30AM the morning of surgery. Drink in one sitting. Do not sip.  ?This drink was given to you during your hospital  ?pre-op appointment visit.  ?Nothing else to drink after completing the  ?12 oz bottle of G2. ? ?       If you have questions, please contact your surgeon?s office. ? ?  ? Take these medicines the morning of surgery with A SIP OF WATER:  ?Allopurinol (Zyloprim) ?Atenolol (Tenormin) ?atorvastatin (LIPITOR) ?Fluticasone (Flonase) ?Loratadine (Claritin) ?Omeprazole (Prilosec) ? ?As of today, STOP taking any Aspirin (unless otherwise instructed by your surgeon) Aleve, Naproxen, Ibuprofen, Motrin, Advil, Goody's, BC's, all herbal medications, fish oil, and all vitamins. ? ?WHAT DO I DO ABOUT MY DIABETES MEDICATION? ? ? ?Do not take oral diabetes medicines (pills) the morning of surgery. DO NOT TAKE METFORMIN (GLUCOPHAGE) the morning of surgery. ? ? ?HOW TO MANAGE YOUR DIABETES ?BEFORE AND AFTER SURGERY ? ?Why is it important to control my blood sugar before and after surgery? ?Improving blood sugar  levels before and after surgery helps healing and can limit problems. ?A way of improving blood sugar control is eating a healthy diet by: ? Eating less sugar and carbohydrates ? Increasing activity/exercise ? Talking with your doctor about reaching your blood sugar goals ?High blood sugars (greater than 180 mg/dL) can raise your risk of infections and slow your recovery, so you will need to focus on controlling your diabetes during the weeks before surgery. ?Make sure that the doctor who takes care of your diabetes knows about your planned surgery including the date and location. ? ?How do I manage my blood sugar before surgery? ?Check your blood sugar at least 4 times a day, starting 2 days before surgery, to make sure that the level is not too high or low. ? ?Check your blood sugar the morning of your surgery when you wake up and every 2 hours until you get to the Short Stay unit. ? ?If your blood sugar is less than 70 mg/dL, you will need to treat for low blood sugar: ?Do not take insulin. ?Treat a low blood sugar (less than 70 mg/dL) with ? cup of clear juice (cranberry or apple), 4 glucose tablets, OR glucose gel. ?Recheck blood sugar in 15 minutes after treatment (to make sure it is greater than 70 mg/dL). If your blood sugar is not greater than 70 mg/dL on recheck, call [phone] for further instructions. ?Report your blood sugar to the short stay nurse when you get to Short Stay. ? ?If you are admitted to the  hospital after surgery: ?Your blood sugar will be checked by the staff and you will probably be given insulin after surgery (instead of oral diabetes medicines) to make sure you have good blood sugar levels. ?The goal for blood sugar control after surgery is 80-180 mg/dL. ? ? ?         ?Do not wear jewelry  ?Do not wear lotions, powders, colognes, or deodorant. ?Do not shave 48 hours prior to surgery.  Men may shave face and neck. ?Do not bring valuables to the hospital. ? ? ?[facility] is not  responsible for any belongings or valuables. .  ? ?Do NOT Smoke (Tobacco/Vaping)  24 hours prior to your procedure ? ?If you use a CPAP at night, you may bring your mask for your overnight stay. ?  ?Contacts, glasses, hearing aids, dentures or partials may not be worn into surgery, please bring cases for these belongings ?  ?For patients admitted to the hospital, discharge time will be determined by your treatment team. ?  ?Patients discharged the day of surgery will not be allowed to drive home, and someone needs to stay with them for 24 hours. ? ?NO VISITORS WILL BE ALLOWED IN PRE-OP WHERE PATIENTS ARE PREPPED FOR SURGERY.  ONLY 1 SUPPORT PERSON MAY BE PRESENT IN THE WAITING ROOM WHILE YOU ARE IN SURGERY.  IF YOU ARE TO BE ADMITTED, ONCE YOU ARE IN YOUR ROOM YOU WILL BE ALLOWED TWO (2) VISITORS. 1 (ONE) VISITOR MAY STAY OVERNIGHT BUT MUST ARRIVE TO THE ROOM BY 8pm.  Minor children may have two parents present. Special consideration for safety and communication needs will be reviewed on a case by case basis. ? ?Special instructions:   ? ?Oral Hygiene is also important to reduce your risk of infection.  Remember - BRUSH YOUR TEETH THE MORNING OF SURGERY WITH YOUR REGULAR TOOTHPASTE ? ? ?[place]- Preparing For Surgery ? ?Before surgery, you can play an important role. Because skin is not sterile, your skin needs to be as free of germs as possible. You can reduce the number of germs on your skin by washing with CHG (chlorahexidine gluconate) Soap before surgery.  CHG is an antiseptic cleaner which kills germs and bonds with the skin to continue killing germs even after washing.   ? ? ?Please do not use if you have an allergy to CHG or antibacterial soaps. If your skin becomes reddened/irritated stop using the CHG.  ?Do not shave (including legs and underarms) for at least 48 hours prior to first CHG shower. It is OK to shave your face. ? ?Please follow these instructions carefully. ?  ? ? Shower the NIGHT BEFORE  SURGERY and the MORNING OF SURGERY with CHG Soap.  ? If you chose to wash your hair, wash your hair first as usual with your normal shampoo. After you shampoo, rinse your hair and body thoroughly to remove the shampoo.  Then [employer] and genitals (private parts) with your normal soap and rinse thoroughly to remove soap. ? ?After that Use CHG Soap as you would any other liquid soap. You can apply CHG directly to the skin and wash gently with a scrungie or a clean washcloth.  ? ?Apply the CHG Soap to your body ONLY FROM THE NECK DOWN.  Do not use on open wounds or open sores. Avoid contact with your eyes, ears, mouth and genitals (private parts). Wash Face and genitals (private parts)  with your normal soap.  ? ?Wash thoroughly, paying special  attention to the area where your surgery will be performed. ? ?Thoroughly rinse your body with warm water from the neck down. ? ?DO NOT shower/wash with your normal soap after using and rinsing off the CHG Soap. ? ?Pat yourself dry with a CLEAN TOWEL. ? ?Wear CLEAN PAJAMAS to bed the night before surgery ? ?Place CLEAN SHEETS on your bed the night before your surgery ? ?DO NOT SLEEP WITH PETS. ? ? ?Day of Surgery: ? ?Take a shower with CHG soap. ?Wear Clean/Comfortable clothing the morning of surgery ?Do not apply any deodorants/lotions.   ?Remember to brush your teeth WITH YOUR REGULAR TOOTHPASTE. ? ? ? ?BEFORE SURGERY: ? ?Notify your provider: ?if you are in close contact with someone who has COVID  ?or if you develop a fever of 100.4 or greater, sneezing, cough, sore throat, shortness of breath or body aches. ? ?  ?Please read over the following fact sheets that you were given.  ? ?

## 2021-04-07 NOTE — Progress Notes (Signed)
?   04/07/21 1004  ?OBSTRUCTIVE SLEEP APNEA  ?Have you ever been diagnosed with sleep apnea through a sleep study? No  ?Do you snore loudly (loud enough to be heard through closed doors)?  0 ?(not since weight loss)  ?Do you often feel tired, fatigued, or sleepy during the daytime (such as falling asleep during driving or talking to someone)? 0  ?Has anyone observed you stop breathing during your sleep? 0  ?Do you have, or are you being treated for high blood pressure? 1  ?BMI more than 35 kg/m2? 1  ?Age > 50 (1-yes) 1  ?Neck circumference greater than:Male 16 inches or larger, Male 17inches or larger? 1  ?Male Gender (Yes=1) 1  ?Obstructive Sleep Apnea Score 5  ?Score 5 or greater  Results sent to PCP  ? ? ?

## 2021-04-07 NOTE — Progress Notes (Addendum)
PCP - Reinaldo Nodal ?Cardiologist - pt denies ? ?Chest x-ray - N/A ?EKG - 04/07/2021  ?Stress Test - denies ?ECHO - see correspondence in Media tab for 09/17/19 ?Cardiac Cath - denies ? ?Sleep Study - denies ?Stop Bang 5- result routed to PCP  ? ?Fasting Blood Sugar - 97 ?Pt states he checks his blood sugar daily and it is usually 97-100. ? ?Labs done 03/20/21 (CBC and CMET)- per anesthesia provider, these labs OK for DOS.  ? ?Aspirin Instructions: ?As of today, STOP taking any Aspirin (unless otherwise instructed by your surgeon) Aleve, Naproxen, Ibuprofen, Motrin, Advil, Goody's, BC's, all herbal medications, fish oil, and all vitamins. ? ?ERAS Protcol - instructions per order- pt given G2 gatorade at PAT appt ? ?COVID TEST- N/A ? ?BP 170/99 at PAT appointment- pt denies symptoms. Pt states he is going to see his PCP tomorrow for medical clearance. PCP has been adjusting his medications d/t his heart rate and BP being elevated recently. Pt states he will talk to his PCP tomorrow  ? ? ?Anesthesia review: review EKG, pt for medical clearance on 04/08/21. Pt very anxious about BP being elevated and worried about surgery being cancelled.  ? ?Patient denies shortness of breath, fever, cough and chest pain at PAT appointment ? ? ?All instructions explained to the patient, with a verbal understanding of the material. Patient agrees to go over the instructions while at home for a better understanding. The opportunity to ask questions was provided. ? ? ?

## 2021-04-07 NOTE — H&P (Signed)
TOTAL HIP ADMISSION H&P ? ?Patient is admitted for right total hip arthroplasty. ? ?Subjective: ? ?Chief Complaint: right hip pain ? ?HPI: Christopher Hurst., 56 y.o. male, has a history of pain and functional disability in the right hip(s) due to arthritis and patient has failed non-surgical conservative treatments for greater than 12 weeks to include NSAID's and/or analgesics, use of assistive devices, weight reduction as appropriate, and activity modification.  Onset of symptoms was gradual starting 10 years ago with gradually worsening course since that time.The patient noted no past surgery on the right hip(s).  Patient currently rates pain in the right hip at 10 out of 10 with activity. Patient has night pain, worsening of pain with activity and weight bearing, trendelenberg gait, pain that interfers with activities of daily living, pain with passive range of motion, crepitus, and joint swelling. Patient has evidence of subchondral cysts, subchondral sclerosis, periarticular osteophytes, and joint space narrowing by imaging studies. This condition presents safety issues increasing the risk of falls. .  There is no current active infection. ? ?Patient Active Problem List  ? Diagnosis Date Noted  ? Unilateral primary osteoarthritis, right hip 02/17/2021  ? S/P total left hip arthroplasty 10/24/2019  ? Diabetes mellitus without complication ([place])   ? S/P cervical spinal fusion 05/02/2018  ? Other bilateral secondary osteoarthritis of knee 07/19/2017  ? Spondylosis of thoracic region without myelopathy or radiculopathy 07/19/2017  ? Midline low back pain without sciatica 04/13/2017  ? Obesity   ? Essential hypertension   ? High cholesterol   ? Gout   ? ?Past Medical History:  ?Diagnosis Date  ? Acid reflux   ? Allergies   ? Anxiety   ? per pt, no medications per patient  ? Arthritis   ? B/L hips  ? Diabetes mellitus without complication ([place])   ? Gout   ? High cholesterol   ? Hypertension   ? Obesity   ? Pneumonia    ? as a kid  ?  ?Past Surgical History:  ?Procedure Laterality Date  ? ANTERIOR CERVICAL DECOMP/DISCECTOMY FUSION N/A 03/17/2018  ? Procedure: CERVICAL FOUR-FIVE, CERVCAL FIVE-SIX ANTERIOR CERVICAL DECOMPRESSION/DISCECTOMY FUSION, ALLOGRAFT PLATE;  Surgeon: [name], MD;  Location: [facility];  Service: Orthopedics;  Laterality: N/A;  ? CYSTECTOMY    ? testicle   ? KNEE ARTHROSCOPY    ? left knee x2  ? SHOULDER ARTHROSCOPY    ? one left shoulder   ? TONSILLECTOMY    ? TOTAL HIP ARTHROPLASTY Left 09/14/2019  ? Procedure: LEFT TOTAL HIP ARTHROPLASTY -DIRECT ANTERIOR;  Surgeon: [name], MD;  Location: [facility];  Service: Orthopedics;  Laterality: Left;  ?  ?No current facility-administered medications for this encounter.  ? ?Current Outpatient Medications  ?Medication Sig Dispense Refill Last Dose  ? allopurinol (ZYLOPRIM) 100 MG tablet Take 2 tablets (200 mg total) by mouth daily. 60 tablet 11   ? atenolol (TENORMIN) 25 MG tablet Take 25 mg by mouth in the morning.     ? atorvastatin (LIPITOR) 40 MG tablet Take 1 tablet (40 mg total) by mouth daily. 90 tablet 3   ? fluticasone (FLONASE) 50 MCG/ACT nasal spray Place 1 spray into both nostrils in the morning.     ? lisinopril (ZESTRIL) 10 MG tablet Take 10 mg by mouth in the morning.     ? loratadine (CLARITIN) 10 MG tablet Take 10 mg by mouth daily.     ? metFORMIN (GLUCOPHAGE) 500 MG tablet Take 1  tablet by mouth once daily with breakfast 90 tablet 1   ? Multiple Vitamins-Minerals (ADULT ONE DAILY GUMMIES PO) Take 1 tablet by mouth in the morning.     ? omeprazole (PRILOSEC) 20 MG capsule Take 20 mg by mouth daily.     ? Blood Glucose Monitoring Suppl (TRUE METRIX METER) w/Device KIT Use as instructed. Check blood glucose levels 2 times per day 1 kit 0   ? escitalopram (LEXAPRO) 5 MG tablet Take 1 tablet (5 mg total) by mouth daily. (Patient not taking: Reported on 04/02/2021) 30 tablet 6 Not Taking  ? glucose blood (TRUE METRIX BLOOD GLUCOSE TEST) test strip Use as  instructed. Check blood glucose levels 2 times per day E11.65 100 each 11   ? TRUEplus Lancets 28G MISC Use as instructed. Check blood glucose level by fingerstick twice per day. E11.65 100 each 3   ? ?No Known Allergies  ?Social History  ? ?Tobacco Use  ? Smoking status: Never  ? Smokeless tobacco: Never  ?Substance Use Topics  ? Alcohol use: Yes  ?  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  ?Family History  ?Problem Relation Age of Onset  ? Healthy Mother   ? Cirrhosis Father   ? Stroke Father   ? Alzheimer's disease Father   ? Alcohol abuse Father   ? Hypertension Brother   ?  ? ?Review of Systems  ?Constitutional:  Positive for activity change.  ?HENT: Negative.    ?Respiratory: Negative.    ?Cardiovascular: Negative.   ?Gastrointestinal: Negative.   ?Genitourinary: Negative.   ?Musculoskeletal:  Positive for gait problem.  ?Psychiatric/Behavioral: Negative.    ? ?Objective: ? ?Physical Exam ?HENT:  ?   Head: Normocephalic and atraumatic.  ?   Nose: Nose normal.  ?Cardiovascular:  ?   Rate and Rhythm: Regular rhythm.  ?   Heart sounds: Normal heart sounds.  ?Pulmonary:  ?   Effort: Pulmonary effort is normal. No respiratory distress.  ?Neurological:  ?   Mental Status: He is alert and oriented to person, place, and time.  ? ? ?Vital signs in last 24 hours: ?Temp:  [98.2 ?F (36.8 ?C)] 98.2 ?F (36.8 ?C) (03/21 0945) ?Pulse Rate:  [64] 64 (03/21 0945) ?Resp:  [17] 17 (03/21 0945) ?BP: (170)/(99) 170/99 (03/21 0945) ?SpO2:  [99 %] 99 % (03/21 0945) ?Weight:  [112.8 kg] 112.8 kg (03/21 0945) ? ?Labs: ? ? ?Estimated body mass index is 36.71 kg/m? as calculated from the following: ?  Height as of 04/07/21: '5\' 9"'  (1.753 m). ?  Weight as of 04/07/21: 112.8 kg. ? ? ?Imaging Review ?Plain radiographs demonstrate moderate degenerative joint disease of the right hip(s). The bone quality appears to be good for age and reported activity level. ? ? ? ? ? ?Assessment/Plan: ? ?End stage  arthritis, right hip(s) ? ?The patient history, physical examination, clinical judgement of the provider and imaging studies are consistent with end stage degenerative joint disease of the right hip(s) and total hip arthroplasty is deemed medically necessary. The treatment options including medical management, injection therapy, arthroscopy and arthroplasty were discussed at length. The risks and benefits of total hip arthroplasty were presented and reviewed. The risks due to aseptic loosening, infection, stiffness, dislocation/subluxation,  thromboembolic complications and other imponderables were discussed.  The patient acknowledged the explanation, agreed to proceed with the plan and consent was signed. Patient is being admitted for inpatient treatment for surgery, pain control, PT,  OT, prophylactic antibiotics, VTE prophylaxis, progressive ambulation and ADL's and discharge planning.The patient is planning to be discharged home with home health services ?

## 2021-04-08 ENCOUNTER — Telehealth: Payer: Self-pay

## 2021-04-08 NOTE — Telephone Encounter (Signed)
PCP will not clear patient for THA on Friday, 04/10/21 due to high blood pressure.  Will cancel surgery and reschedule once they get it better controlled. ?

## 2021-04-08 NOTE — Telephone Encounter (Signed)
noted 

## 2021-04-10 ENCOUNTER — Encounter (HOSPITAL_COMMUNITY): Admission: RE | Payer: Self-pay | Source: Home / Self Care

## 2021-04-10 ENCOUNTER — Ambulatory Visit (HOSPITAL_COMMUNITY): Admission: RE | Admit: 2021-04-10 | Payer: Medicare PPO | Source: Home / Self Care | Admitting: Orthopaedic Surgery

## 2021-04-10 DIAGNOSIS — Z01818 Encounter for other preprocedural examination: Secondary | ICD-10-CM

## 2021-04-10 SURGERY — ARTHROPLASTY, HIP, TOTAL, ANTERIOR APPROACH
Anesthesia: Spinal | Site: Hip | Laterality: Right

## 2021-04-17 ENCOUNTER — Encounter: Payer: Medicare PPO | Admitting: Orthopaedic Surgery

## 2021-07-07 ENCOUNTER — Other Ambulatory Visit: Payer: Self-pay

## 2021-07-24 ENCOUNTER — Ambulatory Visit: Payer: Medicare PPO | Admitting: Surgery

## 2021-07-24 VITALS — BP 156/81 | HR 71

## 2021-07-24 DIAGNOSIS — M1611 Unilateral primary osteoarthritis, right hip: Secondary | ICD-10-CM

## 2021-07-24 NOTE — Progress Notes (Signed)
56 year old white male with history of end-stage DJD right hip and pain comes in for preop evaluation.  States that hip symptoms unchanged from previous visit.  He is wanting to proceed with right total knee replacement as scheduled.  Today history and physical performed.  Review of systems negative.  We received preop medical clearance.  Patient is familiar with what to expect from surgery since he had the left total hip replacement in the past.  Plan We will proceed with surgery.  Patient states that he would like home health PT along with a home health aide to come out after his surgery.  I advised patient that I think that this is a good idea and it will help with his recovery.  All questions answered.

## 2021-07-27 NOTE — Pre-Procedure Instructions (Signed)
Surgical Instructions    Your procedure is scheduled on August 03, 2021.  Report to [facility] Main Entrance "A" at 5:30 A.M., then check in with the Admitting office.  Call this number if you have problems the morning of surgery:  [phone]   If you have any questions prior to your surgery date call [phone]: Open Monday-Friday 8am-4pm    Remember:  Do not eat after midnight the night before your surgery  You may drink clear liquids until 4:30 AM the morning of your surgery.   Clear liquids allowed are: Water, Non-Citrus Juices (without pulp), Carbonated Beverages, Clear Tea, Black Coffee Only (NO MILK, CREAM OR POWDERED CREAMER of any kind), and Gatorade.  Patient Instructions  The night before surgery:  No food after midnight. ONLY clear liquids after midnight  The day of surgery (if you have diabetes): Drink ONE (1) 12 oz G2 given to you in your pre admission testing appointment by 4:30 AM the morning of surgery. Drink in one sitting. Do not sip.  This drink was given to you during your hospital  pre-op appointment visit.  Nothing else to drink after completing the  12 oz bottle of G2.         If you have questions, please contact your surgeon's office.     Take these medicines the morning of surgery with A SIP OF WATER:  allopurinol (ZYLOPRIM)  amLODipine (NORVASC)  atenolol (TENORMIN)  atorvastatin (LIPITOR)  fluticasone (FLONASE)  loratadine (CLARITIN)   omeprazole (PRILOSEC)  As of today, STOP taking any Aspirin (unless otherwise instructed by your surgeon) Aleve, Naproxen, Ibuprofen, Motrin, Advil, Goody's, BC's, all herbal medications, fish oil, and all vitamins.   WHAT DO I DO ABOUT MY DIABETES MEDICATION?   Do not take metFORMIN (GLUCOPHAGE) the morning of surgery.   HOW TO MANAGE YOUR DIABETES BEFORE AND AFTER SURGERY  Why is it important to control my blood sugar before and after surgery? Improving blood sugar levels before and after surgery  helps healing and can limit problems. A way of improving blood sugar control is eating a healthy diet by:  Eating less sugar and carbohydrates  Increasing activity/exercise  Talking with your doctor about reaching your blood sugar goals High blood sugars (greater than 180 mg/dL) can raise your risk of infections and slow your recovery, so you will need to focus on controlling your diabetes during the weeks before surgery. Make sure that the doctor who takes care of your diabetes knows about your planned surgery including the date and location.  How do I manage my blood sugar before surgery? Check your blood sugar at least 4 times a day, starting 2 days before surgery, to make sure that the level is not too high or low.  Check your blood sugar the morning of your surgery when you wake up and every 2 hours until you get to the Short Stay unit.  If your blood sugar is less than 70 mg/dL, you will need to treat for low blood sugar: Do not take insulin. Treat a low blood sugar (less than 70 mg/dL) with  cup of clear juice (cranberry or apple), 4 glucose tablets, OR glucose gel. Recheck blood sugar in 15 minutes after treatment (to make sure it is greater than 70 mg/dL). If your blood sugar is not greater than 70 mg/dL on recheck, call [phone] for further instructions. Report your blood sugar to the short stay nurse when you get to Short Stay.  If you are admitted to  the hospital after surgery: Your blood sugar will be checked by the staff and you will probably be given insulin after surgery (instead of oral diabetes medicines) to make sure you have good blood sugar levels. The goal for blood sugar control after surgery is 80-180 mg/dL.                      Do NOT Smoke (Tobacco/Vaping) for 24 hours prior to your procedure.  If you use a CPAP at night, you may bring your mask/headgear for your overnight stay.   Contacts, glasses, piercing's, hearing aid's, dentures or partials may not be  worn into surgery, please bring cases for these belongings.    For patients admitted to the hospital, discharge time will be determined by your treatment team.   Patients discharged the day of surgery will not be allowed to drive home, and someone needs to stay with them for 24 hours.  SURGICAL WAITING ROOM VISITATION Patients having surgery or a procedure may have two support people in the waiting room. These visitors may be switched out with other visitors if needed. Children under the age of 23 must have an adult accompany them who is not the patient. If the patient needs to stay at the hospital during part of their recovery, the visitor guidelines for inpatient rooms apply.  Please refer to the [facility] website for the visitor guidelines for Inpatients (after your surgery is over and you are in a regular room).    Special instructions:   [facility]- Preparing For Surgery  Before surgery, you can play an important role. Because skin is not sterile, your skin needs to be as free of germs as possible. You can reduce the number of germs on your skin by washing with CHG (chlorahexidine gluconate) Soap before surgery.  CHG is an antiseptic cleaner which kills germs and bonds with the skin to continue killing germs even after washing.    Oral Hygiene is also important to reduce your risk of infection.  Remember - BRUSH YOUR TEETH THE MORNING OF SURGERY WITH YOUR REGULAR TOOTHPASTE  Please do not use if you have an allergy to CHG or antibacterial soaps. If your skin becomes reddened/irritated stop using the CHG.  Do not shave (including legs and underarms) for at least 48 hours prior to first CHG shower. It is OK to shave your face.  Please follow these instructions carefully.   Shower the NIGHT BEFORE SURGERY and the MORNING OF SURGERY  If you chose to wash your hair, wash your hair first as usual with your normal shampoo.  After you shampoo, rinse your hair and body thoroughly to  remove the shampoo.  Use CHG Soap as you would any other liquid soap. You can apply CHG directly to the skin and wash gently with a scrungie or a clean washcloth.   Apply the CHG Soap to your body ONLY FROM THE NECK DOWN.  Do not use on open wounds or open sores. Avoid contact with your eyes, ears, mouth and genitals (private parts). Wash Face and genitals (private parts)  with your normal soap.   Wash thoroughly, paying special attention to the area where your surgery will be performed.  Thoroughly rinse your body with warm water from the neck down.  DO NOT shower/wash with your normal soap after using and rinsing off the CHG Soap.  Pat yourself dry with a CLEAN TOWEL.  Wear CLEAN PAJAMAS to bed the night before surgery  Place  CLEAN SHEETS on your bed the night before your surgery  DO NOT SLEEP WITH PETS.   Day of Surgery: Take a shower with CHG soap. Do not wear jewelry or makeup Do not wear lotions, powders, perfumes/colognes, or deodorant. Do not shave 48 hours prior to surgery.  Men may shave face and neck. Do not bring valuables to the hospital.  [facility] is not responsible for any belongings or valuables. Do not wear nail polish, gel polish, artificial nails, or any other type of covering on natural nails (fingers and toes) If you have artificial nails or gel coating that need to be removed by a nail salon, please have this removed prior to surgery. Artificial nails or gel coating may interfere with anesthesia's ability to adequately monitor your vital signs.  Wear Clean/Comfortable clothing the morning of surgery  Remember to brush your teeth WITH YOUR REGULAR TOOTHPASTE.   Please read over the following fact sheets that you were given.    If you received a COVID test during your pre-op visit  it is requested that you wear a mask when out in public, stay away from anyone that may not be feeling well and notify your surgeon if you develop symptoms. If you have been in  contact with anyone that has tested positive in the last 10 days please notify you surgeon.

## 2021-07-28 ENCOUNTER — Encounter (HOSPITAL_COMMUNITY): Payer: Self-pay

## 2021-07-28 ENCOUNTER — Other Ambulatory Visit: Payer: Self-pay

## 2021-07-28 ENCOUNTER — Encounter (HOSPITAL_COMMUNITY)
Admission: RE | Admit: 2021-07-28 | Discharge: 2021-07-28 | Disposition: A | Discharge status: Home / Self Care | Payer: Medicare PPO | Source: Ambulatory Visit | Attending: Orthopaedic Surgery | Admitting: Orthopaedic Surgery

## 2021-07-28 VITALS — BP 162/85 | HR 67 | Temp 97.6°F | Resp 17 | Ht 68.0 in | Wt 253.8 lb

## 2021-07-28 DIAGNOSIS — M1611 Unilateral primary osteoarthritis, right hip: Secondary | ICD-10-CM | POA: Diagnosis not present

## 2021-07-28 DIAGNOSIS — E119 Type 2 diabetes mellitus without complications: Secondary | ICD-10-CM | POA: Diagnosis not present

## 2021-07-28 DIAGNOSIS — Z01818 Encounter for other preprocedural examination: Secondary | ICD-10-CM

## 2021-07-28 DIAGNOSIS — I071 Rheumatic tricuspid insufficiency: Secondary | ICD-10-CM | POA: Insufficient documentation

## 2021-07-28 DIAGNOSIS — Z01812 Encounter for preprocedural laboratory examination: Secondary | ICD-10-CM | POA: Insufficient documentation

## 2021-07-28 DIAGNOSIS — I2510 Atherosclerotic heart disease of native coronary artery without angina pectoris: Secondary | ICD-10-CM

## 2021-07-28 LAB — SURGICAL PCR SCREEN
MRSA, PCR: NEGATIVE
Staphylococcus aureus: NEGATIVE

## 2021-07-28 LAB — CBC
HCT: 40.2 % (ref 39.0–52.0)
Hemoglobin: 12.9 g/dL — ABNORMAL LOW (ref 13.0–17.0)
MCH: 28.2 pg (ref 26.0–34.0)
MCHC: 32.1 g/dL (ref 30.0–36.0)
MCV: 88 fL (ref 80.0–100.0)
Platelets: 265 10*3/uL (ref 150–400)
RBC: 4.57 MIL/uL (ref 4.22–5.81)
RDW: 13.8 % (ref 11.5–15.5)
WBC: 6.2 10*3/uL (ref 4.0–10.5)
nRBC: 0 % (ref 0.0–0.2)

## 2021-07-28 LAB — BASIC METABOLIC PANEL
Anion gap: 10 (ref 5–15)
BUN: 7 mg/dL (ref 6–20)
CO2: 31 mmol/L (ref 22–32)
Calcium: 9.4 mg/dL (ref 8.9–10.3)
Chloride: 101 mmol/L (ref 98–111)
Creatinine, Ser: 0.72 mg/dL (ref 0.61–1.24)
GFR, Estimated: >60 mL/min (ref >60)
Glucose, Bld: 107 mg/dL — ABNORMAL HIGH (ref 70–99)
Potassium: 4.2 mmol/L (ref 3.5–5.1)
Sodium: 142 mmol/L (ref 135–145)

## 2021-07-28 LAB — TYPE AND SCREEN
ABO/RH(D): B POS
Antibody Screen: NEGATIVE

## 2021-07-28 LAB — GLUCOSE, CAPILLARY: Glucose-Capillary: 142 mg/dL — ABNORMAL HIGH (ref 70–99)

## 2021-07-28 LAB — HEMOGLOBIN A1C
Hgb A1c MFr Bld: 6.8 % — ABNORMAL HIGH (ref 4.8–5.6)
Mean Plasma Glucose: 148.46 mg/dL

## 2021-07-28 NOTE — Progress Notes (Signed)
PCP - Leane Call, PA-C  Cardiologist - Denies  PPM/ICD - Denies Device Orders - n/a Rep Notified - n/a  Chest x-ray - 09/05/2019 EKG - 04/07/2021 Stress Test - Denies ECHO - Per patient, had an echo at [facility] in 2022. Per patient results were normal Cardiac Cath - Denies  Sleep Study - Denies CPAP - n/a  Fasting Blood Sugar - 90-110 Checks Blood Sugar in the morning and at night. CBG 142 at PAT visit. Pt had cheerios and a rice cake for breakfast.   Blood Thinner Instructions: n/a Aspirin Instructions: As of today, stop taking ASA (unless otherwise instructed by your doctor)  ERAS Protcol - Clear liquids until 0430 morning of surgery PRE-SURGERY Ensure or G2- G2 given at PAT visit with instructions.  COVID TEST- n/a   Anesthesia review: Yes. Spoke with Antionette Poles, PA-C during PAT visit. Patients surgery was cancelled in March of 2023 for high BP (180s/90s at PAT visit in March). Pts BP at PAT visit today 162/85 and patient had taken all his BP medications this morning. Will send for review per Fayrene Fearing request.  Patient denies shortness of breath, fever, cough and chest pain at PAT appointment   All instructions explained to the patient, with a verbal understanding of the material. Patient agrees to go over the instructions while at home for a better understanding. Patient also instructed to self quarantine after being tested for COVID-19. The opportunity to ask questions was provided.

## 2021-07-29 NOTE — Anesthesia Preprocedure Evaluation (Addendum)
Anesthesia Evaluation  Patient identified by MRN, date of birth, ID band Patient awake    Reviewed: Allergy & Precautions, H&P , NPO status , Patient's Chart, lab work & pertinent test results  Airway Mallampati: III  TM Distance: >3 FB Neck ROM: Full    Dental no notable dental hx. (+) Poor Dentition, Dental Advisory Given   Pulmonary neg pulmonary ROS,    Pulmonary exam normal breath sounds clear to auscultation       Cardiovascular Exercise Tolerance: Good hypertension, Pt. on medications and Pt. on home beta blockers  Rhythm:Regular Rate:Normal     Neuro/Psych Anxiety negative neurological ROS     GI/Hepatic Neg liver ROS, GERD  Medicated,  Endo/Other  diabetes, Type 2, Oral Hypoglycemic AgentsMorbid obesity  Renal/GU negative Renal ROS  negative genitourinary   Musculoskeletal  (+) Arthritis , Osteoarthritis,    Abdominal   Peds  Hematology negative hematology ROS (+)   Anesthesia Other Findings   Reproductive/Obstetrics negative OB ROS                           Anesthesia Physical Anesthesia Plan  ASA: 3  Anesthesia Plan: Spinal   Post-op Pain Management: Tylenol PO (pre-op)*   Induction: Intravenous  PONV Risk Score and Plan: 2 and Propofol infusion, Ondansetron and Midazolam  Airway Management Planned: Natural Airway and Simple Face Mask  Additional Equipment:   Intra-op Plan:   Post-operative Plan:   Informed Consent: I have reviewed the patients History and Physical, chart, labs and discussed the procedure including the risks, benefits and alternatives for the proposed anesthesia with the patient or authorized representative who has indicated his/her understanding and acceptance.     Dental advisory given  Plan Discussed with: CRNA  Anesthesia Plan Comments: (PAT note by Antionette Poles, PA-C: Surgery previously scheduled for March 2023 but was postponed at the  request of his PCP Reinaldo Nodal, PA-C due to poorly controlled HTN. Medications were titrated with improvement in BP. He was subsequently cleared for surgery on followup 05/13/21.  He reports white coat HTN with BP generally more elevated in medical settings. He does monitor it at home and reports it to be well controlled. BP 162/85 on arrival today, which, while still not optimal, is improved from prior and acceptable for surgery.  Non insulin dependent DM2, well controlled, A1c 6.8 on preop labs.  Remainder of preop labs unremarkable.   EKG 04/07/21: Normal sinus rhythm. Rate 64. Minimal voltage criteria for LVH, may be normal variant.  Echo 05/07/19 [facility]): Conclusions: 1. Sinus rhythm. 2. This was a technically difficult study with suboptimal views. Patient refused Definity contrast. No LV measurements. Bubble study administered at beginning exam. 2 contrast injections with Valsalva without shunt. 3. Grossly normal LV size and systolic function. Unable to comment on regional wall motion. 4. Trace mitral regurgitation.  5.Mild tricuspid regurgitation. RVSP 22 mmHg. )      Anesthesia Quick Evaluation

## 2021-07-29 NOTE — Progress Notes (Signed)
Anesthesia Chart Review:  Surgery previously scheduled for March 2023 but was postponed at the request of his PCP Reinaldo Nodal, PA-C due to poorly controlled HTN. Medications were titrated with improvement in BP. He was subsequently cleared for surgery on followup 05/13/21.  He reports white coat HTN with BP generally more elevated in medical settings. He does monitor it at home and reports it to be well controlled. BP 162/85 on arrival today, which, while still not optimal, is improved from prior and acceptable for surgery.  Non insulin dependent DM2, well controlled, A1c 6.8 on preop labs.  Remainder of preop labs unremarkable.   EKG 04/07/21: Normal sinus rhythm. Rate 64. Minimal voltage criteria for LVH, may be normal variant.  Echo 05/07/19 [facility]): Conclusions: 1.  Sinus rhythm. 2.  This was a technically difficult study with suboptimal views.  Patient refused Definity contrast.  No LV measurements.  Bubble study administered at beginning exam.  2 contrast injections with Valsalva without shunt. 3.  Grossly normal LV size and systolic function.  Unable to comment on regional wall motion. 4.  Trace mitral regurgitation.   5.  Mild tricuspid regurgitation.  RVSP 22 mmHg.   [facility] Short Stay Center/Anesthesiology Phone [phone] 07/29/2021 1:14 PM

## 2021-08-03 ENCOUNTER — Other Ambulatory Visit: Payer: Self-pay

## 2021-08-03 ENCOUNTER — Encounter (HOSPITAL_COMMUNITY)
Admission: RE | Disposition: A | Discharge status: Home / Self Care | Payer: Self-pay | Source: Home / Self Care | Attending: Orthopaedic Surgery

## 2021-08-03 ENCOUNTER — Ambulatory Visit (HOSPITAL_BASED_OUTPATIENT_CLINIC_OR_DEPARTMENT_OTHER): Payer: Medicare PPO | Admitting: Anesthesiology

## 2021-08-03 ENCOUNTER — Observation Stay (HOSPITAL_COMMUNITY): Payer: Medicare PPO

## 2021-08-03 ENCOUNTER — Observation Stay (HOSPITAL_COMMUNITY)
Admission: RE | Admit: 2021-08-03 | Discharge: 2021-08-05 | Disposition: A | Discharge status: Home / Self Care | Payer: Medicare PPO | Attending: Orthopaedic Surgery | Admitting: Orthopaedic Surgery

## 2021-08-03 ENCOUNTER — Ambulatory Visit (HOSPITAL_COMMUNITY): Payer: Medicare PPO

## 2021-08-03 ENCOUNTER — Ambulatory Visit (HOSPITAL_COMMUNITY): Payer: Medicare PPO | Admitting: Physician Assistant

## 2021-08-03 ENCOUNTER — Encounter (HOSPITAL_COMMUNITY): Payer: Self-pay | Admitting: Orthopaedic Surgery

## 2021-08-03 DIAGNOSIS — Z96642 Presence of left artificial hip joint: Secondary | ICD-10-CM | POA: Insufficient documentation

## 2021-08-03 DIAGNOSIS — Z79899 Other long term (current) drug therapy: Secondary | ICD-10-CM | POA: Insufficient documentation

## 2021-08-03 DIAGNOSIS — Z7984 Long term (current) use of oral hypoglycemic drugs: Secondary | ICD-10-CM

## 2021-08-03 DIAGNOSIS — I10 Essential (primary) hypertension: Secondary | ICD-10-CM | POA: Insufficient documentation

## 2021-08-03 DIAGNOSIS — E119 Type 2 diabetes mellitus without complications: Secondary | ICD-10-CM | POA: Insufficient documentation

## 2021-08-03 DIAGNOSIS — M1611 Unilateral primary osteoarthritis, right hip: Principal | ICD-10-CM | POA: Diagnosis present

## 2021-08-03 DIAGNOSIS — Z01818 Encounter for other preprocedural examination: Secondary | ICD-10-CM

## 2021-08-03 DIAGNOSIS — Z794 Long term (current) use of insulin: Secondary | ICD-10-CM | POA: Insufficient documentation

## 2021-08-03 HISTORY — PX: TOTAL HIP ARTHROPLASTY: SHX124

## 2021-08-03 LAB — GLUCOSE, CAPILLARY
Glucose-Capillary: 130 mg/dL — ABNORMAL HIGH (ref 70–99)
Glucose-Capillary: 117 mg/dL — ABNORMAL HIGH (ref 70–99)
Glucose-Capillary: 108 mg/dL — ABNORMAL HIGH (ref 70–99)
Glucose-Capillary: 159 mg/dL — ABNORMAL HIGH (ref 70–99)
Glucose-Capillary: 173 mg/dL — ABNORMAL HIGH (ref 70–99)

## 2021-08-03 SURGERY — ARTHROPLASTY, HIP, TOTAL, ANTERIOR APPROACH
Anesthesia: Spinal | Site: Hip | Laterality: Right

## 2021-08-03 MED ORDER — METFORMIN HCL 500 MG PO TABS
500.0000 mg | ORAL_TABLET | Freq: Every day | ORAL | Status: DC
Start: 2021-08-04 — End: 2021-08-06
  Administered 2021-08-04 – 2021-08-05 (×2): 500 mg via ORAL
  Filled 2021-08-03 (×2): qty 1

## 2021-08-03 MED ORDER — PHENOL 1.4 % MT LIQD
1.0000 | OROMUCOSAL | Status: DC | PRN
Start: 2021-08-03 — End: 2021-08-06

## 2021-08-03 MED ORDER — HYDROMORPHONE HCL 1 MG/ML IJ SOLN
0.5000 mg | INTRAMUSCULAR | Status: DC | PRN
  Administered 2021-08-03: 0.5 mg via INTRAVENOUS
  Filled 2021-08-03: qty 0.5

## 2021-08-03 MED ORDER — DOCUSATE SODIUM 100 MG PO CAPS
100.0000 mg | ORAL_CAPSULE | Freq: Two times a day (BID) | ORAL | Status: DC
  Administered 2021-08-04 – 2021-08-05 (×3): 100 mg via ORAL
  Filled 2021-08-03 (×5): qty 1

## 2021-08-03 MED ORDER — LORATADINE 10 MG PO TABS
10.0000 mg | ORAL_TABLET | Freq: Every day | ORAL | Status: DC
  Administered 2021-08-04 – 2021-08-05 (×2): 10 mg via ORAL
  Filled 2021-08-03 (×2): qty 1

## 2021-08-03 MED ORDER — FENTANYL CITRATE (PF) 250 MCG/5ML IJ SOLN
INTRAMUSCULAR | Status: DC | PRN
Start: 2021-08-03 — End: 2021-08-03
  Administered 2021-08-03: 50 ug via INTRAVENOUS
  Administered 2021-08-03: 100 ug via INTRAVENOUS

## 2021-08-03 MED ORDER — BUPIVACAINE LIPOSOME 1.3 % IJ SUSP
INTRAMUSCULAR | Status: AC
  Filled 2021-08-03: qty 20

## 2021-08-03 MED ORDER — POLYETHYLENE GLYCOL 3350 17 G PO PACK: 17.0000 g | PACK | Freq: Every day | ORAL | Status: DC | PRN

## 2021-08-03 MED ORDER — DEXMEDETOMIDINE HCL IN NACL 80 MCG/20ML IV SOLN
INTRAVENOUS | Status: AC
  Filled 2021-08-03: qty 20

## 2021-08-03 MED ORDER — MIDAZOLAM HCL 2 MG/2ML IJ SOLN
INTRAMUSCULAR | Status: DC | PRN
  Administered 2021-08-03: 2 mg via INTRAVENOUS

## 2021-08-03 MED ORDER — ATENOLOL 25 MG PO TABS
25.0000 mg | ORAL_TABLET | Freq: Every morning | ORAL | Status: DC
  Administered 2021-08-04 – 2021-08-05 (×2): 25 mg via ORAL
  Filled 2021-08-03 (×2): qty 1

## 2021-08-03 MED ORDER — 0.9 % SODIUM CHLORIDE (POUR BTL) OPTIME
TOPICAL | Status: DC | PRN
  Administered 2021-08-03: 1000 mL

## 2021-08-03 MED ORDER — PHENYLEPHRINE HCL-NACL 20-0.9 MG/250ML-% IV SOLN
INTRAVENOUS | Status: DC | PRN
  Administered 2021-08-03: 50 ug/min via INTRAVENOUS

## 2021-08-03 MED ORDER — MIDAZOLAM HCL 2 MG/2ML IJ SOLN
INTRAMUSCULAR | Status: AC
Start: 2021-08-03 — End: ?
  Filled 2021-08-03: qty 2

## 2021-08-03 MED ORDER — ONDANSETRON HCL 4 MG/2ML IJ SOLN
INTRAMUSCULAR | Status: DC | PRN
  Administered 2021-08-03: 4 mg via INTRAVENOUS

## 2021-08-03 MED ORDER — PROPOFOL 500 MG/50ML IV EMUL
INTRAVENOUS | Status: DC | PRN
  Administered 2021-08-03: 75 ug/kg/min via INTRAVENOUS

## 2021-08-03 MED ORDER — BUPIVACAINE LIPOSOME 1.3 % IJ SUSP
INTRAMUSCULAR | Status: DC | PRN
  Administered 2021-08-03: 10 mL

## 2021-08-03 MED ORDER — SODIUM CHLORIDE 0.9 % IV SOLN: INTRAVENOUS | Status: DC | PRN

## 2021-08-03 MED ORDER — HYDROMORPHONE HCL 1 MG/ML IJ SOLN
INTRAMUSCULAR | Status: AC
  Filled 2021-08-03: qty 1

## 2021-08-03 MED ORDER — BUPIVACAINE LIPOSOME 1.3 % IJ SUSP
10.0000 mL | Freq: Once | INTRAMUSCULAR | Status: DC
  Filled 2021-08-03: qty 10

## 2021-08-03 MED ORDER — ACETAMINOPHEN 325 MG PO TABS
325.0000 mg | ORAL_TABLET | Freq: Four times a day (QID) | ORAL | Status: DC | PRN
  Administered 2021-08-04: 650 mg via ORAL
  Filled 2021-08-03: qty 2

## 2021-08-03 MED ORDER — BUPIVACAINE HCL (PF) 0.5 % IJ SOLN
INTRAMUSCULAR | Status: AC
  Filled 2021-08-03: qty 10

## 2021-08-03 MED ORDER — CEFAZOLIN SODIUM-DEXTROSE 2-4 GM/100ML-% IV SOLN
INTRAVENOUS | Status: AC
  Filled 2021-08-03: qty 100

## 2021-08-03 MED ORDER — EPHEDRINE SULFATE-NACL 50-0.9 MG/10ML-% IV SOSY
PREFILLED_SYRINGE | INTRAVENOUS | Status: DC | PRN
  Administered 2021-08-03: 10 mg via INTRAVENOUS
  Administered 2021-08-03: 15 mg via INTRAVENOUS

## 2021-08-03 MED ORDER — METHOCARBAMOL 500 MG PO TABS
500.0000 mg | ORAL_TABLET | Freq: Four times a day (QID) | ORAL | Status: DC | PRN
  Administered 2021-08-03: 500 mg via ORAL
  Filled 2021-08-03: qty 1

## 2021-08-03 MED ORDER — HYDROMORPHONE HCL 1 MG/ML IJ SOLN
0.2500 mg | INTRAMUSCULAR | Status: DC | PRN
  Administered 2021-08-03: 0.5 mg via INTRAVENOUS

## 2021-08-03 MED ORDER — BUPIVACAINE IN DEXTROSE 0.75-8.25 % IT SOLN
INTRATHECAL | Status: DC | PRN
  Administered 2021-08-03: 2 mL via INTRATHECAL

## 2021-08-03 MED ORDER — INSULIN ASPART 100 UNIT/ML IJ SOLN: 0.0000 [IU] | INTRAMUSCULAR | Status: DC | PRN

## 2021-08-03 MED ORDER — MENTHOL 3 MG MT LOZG: 1.0000 | LOZENGE | OROMUCOSAL | Status: DC | PRN

## 2021-08-03 MED ORDER — METHOCARBAMOL 500 MG PO TABS: 500.0000 mg | ORAL_TABLET | Freq: Four times a day (QID) | ORAL | 0 refills | Status: AC | PRN

## 2021-08-03 MED ORDER — OXYCODONE HCL 5 MG PO TABS
5.0000 mg | ORAL_TABLET | ORAL | Status: DC | PRN
  Administered 2021-08-03: 10 mg via ORAL
  Filled 2021-08-03 (×2): qty 2

## 2021-08-03 MED ORDER — BUPIVACAINE HCL 0.5 % IJ SOLN
INTRAMUSCULAR | Status: DC | PRN
  Administered 2021-08-03: 10 mL

## 2021-08-03 MED ORDER — ALLOPURINOL 100 MG PO TABS
200.0000 mg | ORAL_TABLET | Freq: Every day | ORAL | Status: DC
  Administered 2021-08-04 – 2021-08-05 (×2): 200 mg via ORAL
  Filled 2021-08-03 (×2): qty 2

## 2021-08-03 MED ORDER — ONDANSETRON HCL 4 MG/2ML IJ SOLN
4.0000 mg | Freq: Four times a day (QID) | INTRAMUSCULAR | Status: DC | PRN
  Administered 2021-08-03: 4 mg via INTRAVENOUS
  Filled 2021-08-03: qty 2

## 2021-08-03 MED ORDER — ONDANSETRON HCL 4 MG PO TABS: 4.0000 mg | ORAL_TABLET | Freq: Four times a day (QID) | ORAL | Status: DC | PRN

## 2021-08-03 MED ORDER — ACETAMINOPHEN 500 MG PO TABS
ORAL_TABLET | ORAL | Status: AC
  Filled 2021-08-03: qty 2

## 2021-08-03 MED ORDER — METOCLOPRAMIDE HCL 5 MG PO TABS: 5.0000 mg | ORAL_TABLET | Freq: Three times a day (TID) | ORAL | Status: DC | PRN

## 2021-08-03 MED ORDER — ORAL CARE MOUTH RINSE: 15.0000 mL | Freq: Once | OROMUCOSAL | Status: AC

## 2021-08-03 MED ORDER — LACTATED RINGERS IV SOLN: INTRAVENOUS | Status: DC

## 2021-08-03 MED ORDER — METHOCARBAMOL 1000 MG/10ML IJ SOLN: 500.0000 mg | Freq: Four times a day (QID) | INTRAVENOUS | Status: DC | PRN

## 2021-08-03 MED ORDER — ACETAMINOPHEN 500 MG PO TABS: 1000.0000 mg | ORAL_TABLET | Freq: Once | ORAL | Status: DC

## 2021-08-03 MED ORDER — METOCLOPRAMIDE HCL 5 MG/ML IJ SOLN
5.0000 mg | Freq: Three times a day (TID) | INTRAMUSCULAR | Status: DC | PRN
  Administered 2021-08-04: 10 mg via INTRAVENOUS
  Filled 2021-08-03: qty 2

## 2021-08-03 MED ORDER — FENTANYL CITRATE (PF) 250 MCG/5ML IJ SOLN
INTRAMUSCULAR | Status: AC
  Filled 2021-08-03: qty 5

## 2021-08-03 MED ORDER — BENAZEPRIL HCL 20 MG PO TABS
10.0000 mg | ORAL_TABLET | Freq: Every day | ORAL | Status: DC
  Administered 2021-08-03 – 2021-08-05 (×3): 10 mg via ORAL
  Filled 2021-08-03 (×3): qty 1

## 2021-08-03 MED ORDER — INSULIN ASPART 100 UNIT/ML IJ SOLN
0.0000 [IU] | Freq: Three times a day (TID) | INTRAMUSCULAR | Status: DC
  Administered 2021-08-03: 3 [IU] via SUBCUTANEOUS
  Administered 2021-08-04: 2 [IU] via SUBCUTANEOUS
  Administered 2021-08-04: 3 [IU] via SUBCUTANEOUS
  Administered 2021-08-05 (×3): 2 [IU] via SUBCUTANEOUS

## 2021-08-03 MED ORDER — OXYCODONE-ACETAMINOPHEN 7.5-325 MG PO TABS: 1.0000 | ORAL_TABLET | ORAL | 0 refills | Status: AC | PRN

## 2021-08-03 MED ORDER — CHLORHEXIDINE GLUCONATE 0.12 % MT SOLN
OROMUCOSAL | Status: AC
  Administered 2021-08-03: 15 mL via OROMUCOSAL
  Filled 2021-08-03: qty 15

## 2021-08-03 MED ORDER — ACETAMINOPHEN 10 MG/ML IV SOLN
INTRAVENOUS | Status: DC | PRN
  Administered 2021-08-03: 1000 mg via INTRAVENOUS

## 2021-08-03 MED ORDER — CHLORHEXIDINE GLUCONATE 0.12 % MT SOLN: 15.0000 mL | Freq: Once | OROMUCOSAL | Status: AC

## 2021-08-03 MED ORDER — AMLODIPINE BESYLATE 5 MG PO TABS
5.0000 mg | ORAL_TABLET | Freq: Every day | ORAL | Status: DC
Start: 2021-08-04 — End: 2021-08-06
  Administered 2021-08-04 – 2021-08-05 (×2): 5 mg via ORAL
  Filled 2021-08-03 (×2): qty 1

## 2021-08-03 MED ORDER — CEFAZOLIN SODIUM-DEXTROSE 2-4 GM/100ML-% IV SOLN
2.0000 g | INTRAVENOUS | Status: AC
  Administered 2021-08-03: 2 g via INTRAVENOUS

## 2021-08-03 MED ORDER — SODIUM CHLORIDE 0.9 % IV SOLN: INTRAVENOUS | Status: DC

## 2021-08-03 MED ORDER — TRANEXAMIC ACID-NACL 1000-0.7 MG/100ML-% IV SOLN
1000.0000 mg | INTRAVENOUS | Status: AC
  Administered 2021-08-03: 1000 mg via INTRAVENOUS

## 2021-08-03 MED ORDER — PANTOPRAZOLE SODIUM 40 MG PO TBEC
40.0000 mg | DELAYED_RELEASE_TABLET | Freq: Every day | ORAL | Status: DC
  Administered 2021-08-04 – 2021-08-05 (×2): 40 mg via ORAL
  Filled 2021-08-03 (×2): qty 1

## 2021-08-03 MED ORDER — FLUTICASONE PROPIONATE 50 MCG/ACT NA SUSP
2.0000 | Freq: Every morning | NASAL | Status: DC
Start: 2021-08-04 — End: 2021-08-06
  Administered 2021-08-04 – 2021-08-05 (×2): 2 via NASAL
  Filled 2021-08-03: qty 16

## 2021-08-03 MED ORDER — TRANEXAMIC ACID-NACL 1000-0.7 MG/100ML-% IV SOLN
INTRAVENOUS | Status: AC
  Filled 2021-08-03: qty 100

## 2021-08-03 MED ORDER — ATORVASTATIN CALCIUM 40 MG PO TABS
40.0000 mg | ORAL_TABLET | Freq: Every day | ORAL | Status: DC
  Administered 2021-08-04 – 2021-08-05 (×2): 40 mg via ORAL
  Filled 2021-08-03 (×2): qty 1

## 2021-08-03 MED ORDER — ASPIRIN 325 MG PO TBEC
325.0000 mg | DELAYED_RELEASE_TABLET | Freq: Every day | ORAL | Status: DC
Start: 2021-08-04 — End: 2021-08-06
  Administered 2021-08-04 – 2021-08-05 (×2): 325 mg via ORAL
  Filled 2021-08-03 (×2): qty 1

## 2021-08-03 MED ORDER — ASPIRIN 325 MG PO TBEC: 325.0000 mg | DELAYED_RELEASE_TABLET | Freq: Every day | ORAL | 0 refills | Status: AC

## 2021-08-03 SURGICAL SUPPLY — 57 items
ACETAB CUP W/GRIPTION 54 (Plate) ×2 IMPLANT
BAG COUNTER SPONGE SURGICOUNT (BAG) ×2 IMPLANT
BENZOIN TINCTURE PRP APPL 2/3 (GAUZE/BANDAGES/DRESSINGS) ×2 IMPLANT
BLADE CLIPPER SURG (BLADE) IMPLANT
BLADE SAW SGTL 18X1.27X75 (BLADE) ×2 IMPLANT
COVER PERINEAL POST (MISCELLANEOUS) ×2 IMPLANT
COVER SURGICAL LIGHT HANDLE (MISCELLANEOUS) ×2 IMPLANT
CUP ACETAB W/GRIPTION 54 (Plate) IMPLANT
DERMABOND ADVANCED (GAUZE/BANDAGES/DRESSINGS) ×1
DERMABOND ADVANCED .7 DNX12 (GAUZE/BANDAGES/DRESSINGS) IMPLANT
DRAPE C-ARM 42X72 X-RAY (DRAPES) ×2 IMPLANT
DRAPE IMP U-DRAPE 54X76 (DRAPES) ×2 IMPLANT
DRAPE STERI IOBAN 125X83 (DRAPES) ×2 IMPLANT
DRAPE U-SHAPE 47X51 STRL (DRAPES) ×6 IMPLANT
DRSG MEPILEX BORDER 4X12 (GAUZE/BANDAGES/DRESSINGS) ×1 IMPLANT
DRSG MEPILEX BORDER 4X8 (GAUZE/BANDAGES/DRESSINGS) ×2 IMPLANT
DURAPREP 26ML APPLICATOR (WOUND CARE) ×2 IMPLANT
ELECT BLADE 4.0 EZ CLEAN MEGAD (MISCELLANEOUS)
ELECT CAUTERY BLADE 6.4 (BLADE) ×2 IMPLANT
ELECT REM PT RETURN 9FT ADLT (ELECTROSURGICAL) ×2
ELECTRODE BLDE 4.0 EZ CLN MEGD (MISCELLANEOUS) IMPLANT
ELECTRODE REM PT RTRN 9FT ADLT (ELECTROSURGICAL) ×1 IMPLANT
ELIMINATOR HOLE APEX DEPUY (Hips) ×1 IMPLANT
FACESHIELD WRAPAROUND (MASK) ×6 IMPLANT
FACESHIELD WRAPAROUND OR TEAM (MASK) ×2 IMPLANT
GLOVE BIOGEL PI IND STRL 6 (GLOVE) IMPLANT
GLOVE BIOGEL PI IND STRL 8 (GLOVE) ×2 IMPLANT
GLOVE BIOGEL PI INDICATOR 6 (GLOVE) ×1
GLOVE BIOGEL PI INDICATOR 8 (GLOVE) ×2
GLOVE ORTHO TXT STRL SZ7.5 (GLOVE) ×4 IMPLANT
GOWN STRL REUS W/ TWL LRG LVL3 (GOWN DISPOSABLE) ×1 IMPLANT
GOWN STRL REUS W/ TWL XL LVL3 (GOWN DISPOSABLE) ×1 IMPLANT
GOWN STRL REUS W/TWL 2XL LVL3 (GOWN DISPOSABLE) ×2 IMPLANT
GOWN STRL REUS W/TWL LRG LVL3 (GOWN DISPOSABLE) ×1
GOWN STRL REUS W/TWL XL LVL3 (GOWN DISPOSABLE) ×1
HEAD M SROM 36MM PLUS 1.5 (Hips) IMPLANT
KIT BASIN OR (CUSTOM PROCEDURE TRAY) ×2 IMPLANT
KIT TURNOVER KIT B (KITS) ×2 IMPLANT
LINER NEUTRAL 54X36MM PLUS 4 (Hips) ×1 IMPLANT
MANIFOLD NEPTUNE II (INSTRUMENTS) ×2 IMPLANT
NDL HYPO 21X1 ECLIPSE (NEEDLE) ×1 IMPLANT
NEEDLE HYPO 21X1 ECLIPSE (NEEDLE) ×2 IMPLANT
NS IRRIG 1000ML POUR BTL (IV SOLUTION) ×2 IMPLANT
PACK TOTAL JOINT (CUSTOM PROCEDURE TRAY) ×2 IMPLANT
PAD ARMBOARD 7.5X6 YLW CONV (MISCELLANEOUS) ×4 IMPLANT
SROM M HEAD 36MM PLUS 1.5 (Hips) ×2 IMPLANT
STEM FEM ACTIS STD SZ4 (Stem) ×1 IMPLANT
SUT VIC AB 0 CT1 27 (SUTURE) ×1
SUT VIC AB 0 CT1 27XBRD ANBCTR (SUTURE) ×1 IMPLANT
SUT VIC AB 2-0 CT1 27 (SUTURE) ×1
SUT VIC AB 2-0 CT1 TAPERPNT 27 (SUTURE) ×1 IMPLANT
SUT VICRYL 4-0 PS2 18IN ABS (SUTURE) ×2 IMPLANT
SUT VLOC 180 0 24IN GS25 (SUTURE) ×2 IMPLANT
SYR 20CC LL (SYRINGE) ×2 IMPLANT
TOWEL GREEN STERILE (TOWEL DISPOSABLE) ×4 IMPLANT
TOWEL GREEN STERILE FF (TOWEL DISPOSABLE) ×2 IMPLANT
TRAY FOLEY MTR SLVR 16FR STAT (SET/KITS/TRAYS/PACK) ×2 IMPLANT

## 2021-08-03 NOTE — Interval H&P Note (Signed)
History and Physical Interval Note:  08/03/2021 7:30 AM  Christopher Hurst.  has presented today for surgery, with the diagnosis of Osteoarthritis Right Hip.  The various methods of treatment have been discussed with the patient and family. After consideration of risks, benefits and other options for treatment, the patient has consented to  Procedure(s): Right TOTAL HIP ARTHROPLASTY ANTERIOR APPROACH (Right) as a surgical intervention.  The patient's history has been reviewed, patient examined, no change in status, stable for surgery.  I have reviewed the patient's chart and labs.  Questions were answered to the patient's satisfaction.     Eldred Manges

## 2021-08-03 NOTE — Progress Notes (Signed)
PT Cancellation Note  Patient Details Name: Christopher Hurst. MRN: [id number] DOB: 27-Oct-1965   Cancelled Treatment:    Reason Eval/Treat Not Completed: Medical issues which prohibited therapy Pt reporting increased nausea. Will follow up as schedule allows.   [name], DPT  Acute Rehabilitation Services  Office: [phone]    [name] 08/03/2021, 2:31 PM

## 2021-08-03 NOTE — Op Note (Signed)
Preop diagnosis: Right hip primary osteoarthritis  Postop diagnosis: Same  Procedure: Right total hip arthroplasty, direct anterior approach  Surgeon: Ophelia Charter MD  Assistant: Zonia Kief, PA-C medically necessary and present for the entire procedure  Anesthesia spinal: Plus Exparel and Marcaine 10+10.  EBL: 250 Implants  ACETAB CUP W/GRIPTION 54 - [id number]  Inventory Item: ACETAB CUP W/GRIPTION 54 Serial no.:  Model/Cat no.: [id number]  Implant name: ACETAB CUP W/GRIPTION 54 - [id number] Laterality: Right Area: Hip  Manufacturer: DEPUY ORTH[employer] Date of Manufacture:    Action: Implanted Number Used: 1   Device Identifier:  Device Identifier TypeLavone Neri DEPUY - [id number]  Inventory Item: [employer] APEX DEPUY Serial no.:  Model/Cat no.: [id number]  Implant nameWaylan Rocher APEX DEPUY - [id number] Laterality: Right Area: Hip  Manufacturer: [employer]TH[employer] Date of Manufacture:    Action: Implanted Number Used: 1   Device Identifier:  Device Identifier Type:     LINER NEUTRAL 54X36MM PLUS 4 - [id number]  Inventory Item: LINER NEUTRAL 54X36MM PLUS 4 Serial no.:  Model/Cat no.: [id number]  Implant name: LINER NEUTRAL 54X36MM PLUS 4 - [id number] Laterality: Right Area: Hip  Manufacturer: [employer] Date of Manufacture:    Action: Implanted Number Used: 1   Device Identifier:  Device Identifier Type:     STEM FEM ACTIS STD SZ4 - [id number]  Inventory Item: STEM FEM ACTIS STD SZ4 Serial no.:  Model/Cat no.: [id number]  Implant name: STEM FEM ACTIS STD SZ4 - [id number] Laterality: Right Area: Hip  Manufacturer: [employer] Date of Manufacture:    Action: Implanted Number Used: 1   Device Identifier:  Device Identifier Type:     SROM M HEAD PLUS 1.5 - [id number]  Inventory Item: SROM M HEAD PLUS 1.5 Serial no.:  Model/Cat no.: [id number]  Implant name: SROM M HEAD PLUS 1.5 - [id number] Laterality: Right Area: Hip  Manufacturer:  [employer] Date of Manufacture:    Action: Implanted Number Used: 1   Device Identifier:  Device Identifier Type:    Procedure: After induction of spinal anesthesia Foley catheter placement Hana boot application patient was transferred to the Hana table C arm was brought in 1015 drapes applied above and below after skin clippers were used the clipped surface tear in the region for the incision.  After hip visualization DuraPrep the usual total hip sheets drapes shower curtain drape and hydraulic arm was applied and sealed.  Timeout procedure completed Ancef prophylaxis 2 g and 1 g TXA.  After timeout procedure incision was made 1 fingerbreadth 1 lateral and 1 fingerbreadth inferior to the ASIS obliquely to the trochanter subtenons tissue divided with the Bovie fascia was cleaned off with a Cobb nicked extended and then later elevated medially over the capsule transverse bleeders were coagulated carefully.  Some fat was removed over the top the capsule capsule was opened gush of clear yellow fluid.  Anterior capsulectomy was performed and neck was cut under C-arm visualization complaining the With an osteotome.  Head was removed with a corkscrew.  There was bone-on-bone changes identical to the opposite hip.  We progressed up and on the right side we ended up with 53 reamer being appropriate fit checked under C arm and a 54 DePuy GRIPTION cup was selected 54x36+4 neutral liner was inserted after apex hole luminary inserted and cup was seated down completely.  Cup was tested was secured hydraulic arm was taken hip was externally rotated initially to 110 later  on we brought it back up excellently rotated to 130 then took it down and under which gave [place] exposure for the canal due to the patient's large size.  Large trochanteric clamp meal was placed medially on the neck the posterior capsule was divided to allow the femur to ride slightly more lateral and then sequential preparation of the canal with cookie  cutter rondure large curette starter broach 0 broach and then progressing up to #4 broach which will the canal.  Opposite hip had a #3 stem #4 fit tightly and +1.5 neck length restored length identical external rotation of the hip in neutral position and 90 hip could be taken down almost to the floor there is no subluxation hip is stable.  Trial broach was removed permanent stem was inserted #4 with collar flexion on the calcar.  +1.5 metal ball was impacted hip was reduced identical findings of stability repeat coagulation of the bleeders.  V-Loc closure for the fascia.  0 Vicryl 2-0 Vicryl close additional Morris superficial layers and then skin staple closure Marcaine and Exparel had been injected just prior to closure patient tolerated procedure well transferred recovery in stable condition.

## 2021-08-03 NOTE — Discharge Instructions (Signed)
INSTRUCTIONS AFTER JOINT REPLACEMENT   Remove items at home which could result in a fall. This includes throw rugs or furniture in walking pathways ICE to the affected joint every three hours while awake for 30 minutes at a time, for at least the first 3-5 days, and then as needed for pain and swelling.  Continue to use ice for pain and swelling. You may notice swelling that will progress down to the foot and ankle.  This is normal after surgery.  Elevate your leg when you are not up walking on it.   Continue to use the breathing machine you got in the hospital (incentive spirometer) which will help keep your temperature down.  It is common for your temperature to cycle up and down following surgery, especially at night when you are not up moving around and exerting yourself.  The breathing machine keeps your lungs expanded and your temperature down.   DIET:  As you were doing prior to hospitalization, we recommend a well-balanced diet.  DRESSING / WOUND CARE / SHOWERING  You may change your dressing 3-5 days after surgery.  Then change the dressing every day with sterile gauze.  Please use good hand washing techniques before changing the dressing.  Do not use any lotions or creams on the incision until instructed by your surgeon.  ACTIVITY  Increase activity slowly as tolerated, but follow the weight bearing instructions below.   No driving for 6 weeks or until further direction given by your physician.  You cannot drive while taking narcotics.  No lifting or carrying greater than 10 lbs. until further directed by your surgeon. Avoid periods of inactivity such as sitting longer than an hour when not asleep. This helps prevent blood clots.  You may return to work once you are authorized by your doctor.     WEIGHT BEARING   Weight bearing as tolerated with assist device (walker, cane, etc) as directed, use it as long as suggested by your surgeon or therapist, typically at least 4-6  weeks.   EXERCISES  Results after joint replacement surgery are often greatly improved when you follow the exercise, range of motion and muscle strengthening exercises prescribed by your doctor. Safety measures are also important to protect the joint from further injury. Any time any of these exercises cause you to have increased pain or swelling, decrease what you are doing until you are comfortable again and then slowly increase them. If you have problems or questions, call your caregiver or physical therapist for advice.   Rehabilitation is important following a joint replacement. After just a few days of immobilization, the muscles of the leg can become weakened and shrink (atrophy).  These exercises are designed to build up the tone and strength of the thigh and leg muscles and to improve motion. Often times heat used for twenty to thirty minutes before working out will loosen up your tissues and help with improving the range of motion but do not use heat for the first two weeks following surgery (sometimes heat can increase post-operative swelling).    A rehabilitation program following joint replacement surgery can speed recovery and prevent re-injury in the future due to weakened muscles. Contact your doctor or a physical therapist for more information on knee rehabilitation.    CONSTIPATION  Constipation is defined medically as fewer than three stools per week and severe constipation as less than one stool per week.  Even if you have a regular bowel pattern at home, your normal regimen  is likely to be disrupted due to multiple reasons following surgery.  Combination of anesthesia, postoperative narcotics, change in appetite and fluid intake all can affect your bowels.   YOU MUST use at least one of the following options; they are listed in order of increasing strength to get the job done.  They are all available over the counter, and you may need to use some, POSSIBLY even all of these  options:    Drink plenty of fluids (prune juice may be helpful) and high fiber foods Colace 100 mg by mouth twice a day  Senokot for constipation as directed and as needed Dulcolax (bisacodyl), take with full glass of water  Miralax (polyethylene glycol) once or twice a day as needed.  If you have tried all these things and are unable to have a bowel movement in the first 3-4 days after surgery call either your surgeon or your primary doctor.    If you experience loose stools or diarrhea, hold the medications until you stool forms back up.  If your symptoms do not get better within 1 week or if they get worse, check with your doctor.  If you experience "the worst abdominal pain ever" or develop nausea or vomiting, please contact the office immediately for further recommendations for treatment.   ITCHING:  If you experience itching with your medications, try taking only a single pain pill, or even half a pain pill at a time.  You can also use Benadryl over the counter for itching or also to help with sleep.   TED HOSE STOCKINGS:  Use stockings on both legs until for at least 2 weeks or as directed by physician office. They may be removed at night for sleeping.  MEDICATIONS:  See your medication summary on the "After Visit Summary" that nursing will review with you.  You may have some home medications which will be placed on hold until you complete the course of blood thinner medication.  It is important for you to complete the blood thinner medication as prescribed.  PRECAUTIONS:  If you experience chest pain or shortness of breath - call 911 immediately for transfer to the hospital emergency department.   If you develop a fever greater that 101 F, purulent drainage from wound, increased redness or drainage from wound, foul odor from the wound/dressing, or calf pain - CONTACT YOUR SURGEON.                                                   FOLLOW-UP APPOINTMENTS:  If you do not already have a  post-op appointment, please call the office for an appointment to be seen by your surgeon.  Guidelines for how soon to be seen are listed in your "After Visit Summary", but are typically between 1-4 weeks after surgery.  OTHER INSTRUCTIONS:   Knee Replacement:  Do not place pillow under knee, focus on keeping the knee straight while resting. CPM instructions: 0-90 degrees, 2 hours in the morning, 2 hours in the afternoon, and 2 hours in the evening. Place foam block, curve side up under heel at all times except when in CPM or when walking.  DO NOT modify, tear, cut, or change the foam block in any way.  POST-OPERATIVE OPIOID TAPER INSTRUCTIONS: It is important to wean off of your opioid medication as soon as possible. If you  do not need pain medication after your surgery it is ok to stop day one. Opioids include: Codeine, Hydrocodone(Norco, Vicodin), Oxycodone(Percocet, oxycontin) and hydromorphone amongst others.  Long term and even short term use of opiods can cause: Increased pain response Dependence Constipation Depression Respiratory depression And more.  Withdrawal symptoms can include Flu like symptoms Nausea, vomiting And more Techniques to manage these symptoms Hydrate well Eat regular healthy meals Stay active Use relaxation techniques(deep breathing, meditating, yoga) Do Not substitute Alcohol to help with tapering If you have been on opioids for less than two weeks and do not have pain than it is ok to stop all together.  Plan to wean off of opioids This plan should start within one week post op of your joint replacement. Maintain the same interval or time between taking each dose and first decrease the dose.  Cut the total daily intake of opioids by one tablet each day Next start to increase the time between doses. The last dose that should be eliminated is the evening dose.   MAKE SURE YOU:  Understand these instructions.  Get help right away if you are not doing  well or get worse.    Thank you for letting us be a part of your medical care team.  It is a privilege we respect greatly.  We hope these instructions will help you stay on track for a fast and full recovery!     Dental Antibiotics:  In most cases prophylactic antibiotics for Dental procdeures after total joint surgery are not necessary.  Exceptions are as follows:  1. History of prior total joint infection  2. Severely immunocompromised (Organ Transplant, cancer chemotherapy, Rheumatoid biologic meds such as Humera)  3. Poorly controlled diabetes (A1C &gt; 8.0, blood glucose over 200)  If you have one of these conditions, contact your surgeon for an antibiotic prescription, prior to your dental procedure.

## 2021-08-03 NOTE — Anesthesia Procedure Notes (Signed)
Spinal  Patient location during procedure: OR Start time: 08/03/2021 7:42 AM End time: 08/03/2021 7:47 AM Reason for block: surgical anesthesia Staffing Performed: anesthesiologist  Anesthesiologist: Gaynelle Adu, MD Performed by: Gaynelle Adu, MD Authorized by: Gaynelle Adu, MD   Preanesthetic Checklist Completed: patient identified, IV checked, risks and benefits discussed, surgical consent, monitors and equipment checked, pre-op evaluation and timeout performed Spinal Block Patient position: sitting Prep: DuraPrep Patient monitoring: cardiac monitor, continuous pulse ox and blood pressure Approach: midline Location: L3-4 Injection technique: single-shot Needle Needle type: Pencan  Needle gauge: 24 G Needle length: 9 cm Assessment Sensory level: T8 Events: CSF return Additional Notes Functioning IV was confirmed and monitors were applied. Sterile prep and drape, including hand hygiene and sterile gloves were used. The patient was positioned and the spine was prepped. The skin was anesthetized with lidocaine.  Free flow of clear CSF was obtained prior to injecting local anesthetic into the CSF.  The spinal needle aspirated freely following injection.  The needle was carefully withdrawn.  The patient tolerated the procedure well.

## 2021-08-03 NOTE — Transfer of Care (Signed)
Immediate Anesthesia Transfer of Care Note  Patient: Christopher Hurst.  Procedure(s) Performed: Right TOTAL HIP ARTHROPLASTY ANTERIOR APPROACH (Right: Hip)  Patient Location: PACU  Anesthesia Type:MAC combined with regional for post-op pain  Level of Consciousness: awake, alert  and oriented  Airway & Oxygen Therapy: Patient Spontanous Breathing  Post-op Assessment: Report given to RN and Post -op Vital signs reviewed and stable  Post vital signs: Reviewed and stable  Last Vitals:  Vitals Value Taken Time  BP 113/67 08/03/21 0943  Temp    Pulse 78 08/03/21 0945  Resp 18 08/03/21 0945  SpO2 96 % 08/03/21 0945  Vitals shown include unvalidated device data.  Last Pain:  Vitals:   08/03/21 0616  TempSrc:   PainSc: 9       Patients Stated Pain Goal: 3 (07/61/51 8343)  Complications: No notable events documented.

## 2021-08-03 NOTE — Interval H&P Note (Signed)
History and Physical Interval Note:  08/03/2021 7:29 AM  Christopher Hurst.  has presented today for surgery, with the diagnosis of Osteoarthritis Right Hip.  The various methods of treatment have been discussed with the patient and family. After consideration of risks, benefits and other options for treatment, the patient has consented to  Procedure(s): Right TOTAL HIP ARTHROPLASTY ANTERIOR APPROACH (Right) as a surgical intervention.  The patient's history has been reviewed, patient examined, no change in status, stable for surgery.  I have reviewed the patient's chart and labs.  Questions were answered to the patient's satisfaction.     Eldred Manges

## 2021-08-03 NOTE — Plan of Care (Signed)
?  Problem: Nutrition: ?Goal: Adequate nutrition will be maintained ?Outcome: Progressing ?  ?Problem: Coping: ?Goal: Level of anxiety will decrease ?Outcome: Progressing ?  ?Problem: Elimination: ?Goal: Will not experience complications related to bowel motility ?Outcome: Progressing ?Goal: Will not experience complications related to urinary retention ?Outcome: Progressing ?  ?Problem: Pain Managment: ?Goal: General experience of comfort will improve ?Outcome: Progressing ?  ?Problem: Safety: ?Goal: Ability to remain free from injury will improve ?Outcome: Progressing ?  ?

## 2021-08-03 NOTE — H&P (Signed)
TOTAL HIP ADMISSION H&P  Patient is admitted for right total hip arthroplasty.  Subjective:  Chief Complaint: right hip pain  HPI: Christopher Hurst., 56 y.o. male, has a history of pain and functional disability in the right hip(s) due to arthritis and patient has failed non-surgical conservative treatments for greater than 12 weeks to include NSAID's and/or analgesics, supervised PT with diminished ADL's post treatment, use of assistive devices, weight reduction as appropriate, and activity modification.  Onset of symptoms was gradual starting 10 years ago with gradually worsening course since that time.The patient noted no past surgery on the right hip(s).  Patient currently rates pain in the right hip at 10 out of 10 with activity. Patient has night pain, worsening of pain with activity and weight bearing, trendelenberg gait, pain that interfers with activities of daily living, and pain with passive range of motion. Patient has evidence of subchondral cysts, subchondral sclerosis, periarticular osteophytes, and joint space narrowing by imaging studies. This condition presents safety issues increasing the risk of falls. .  There is no current active infection.  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/24/2019   Diabetes mellitus without complication (HCC)    S/P cervical spinal fusion 05/02/2018   Other bilateral secondary osteoarthritis of knee 07/19/2017   Spondylosis of thoracic region without myelopathy or radiculopathy 07/19/2017   Midline low back pain without sciatica 04/13/2017   Obesity    Essential hypertension    High cholesterol    Gout    Past Medical History:  Diagnosis Date   Acid reflux    Allergies    Anxiety    per pt, no medications per patient   Arthritis    B/L hips   Diabetes mellitus without complication (HCC)    Gout    High cholesterol    Hypertension     Obesity    Pneumonia    as a kid    Past Surgical History:  Procedure Laterality Date   ANTERIOR CERVICAL DECOMP/DISCECTOMY FUSION N/A 03/17/2018   Procedure: CERVICAL FOUR-FIVE, CERVCAL FIVE-SIX ANTERIOR CERVICAL DECOMPRESSION/DISCECTOMY FUSION, ALLOGRAFT PLATE;  Surgeon: Eldred Manges, MD;  Location: [facility] OR;  Service: Orthopedics;  Laterality: N/A;   CYSTECTOMY     testicle    KNEE ARTHROSCOPY     left knee x2   SHOULDER ARTHROSCOPY     one left shoulder    TONSILLECTOMY     TOTAL HIP ARTHROPLASTY Left 09/14/2019   Procedure: LEFT TOTAL HIP ARTHROPLASTY -DIRECT ANTERIOR;  Surgeon: Eldred Manges, MD;  Location: [facility] OR;  Service: Orthopedics;  Laterality: Left;    Current Facility-Administered Medications  Medication Dose Route Frequency Provider Last Rate Last Admin   acetaminophen (TYLENOL) tablet 1,000 mg  1,000 mg Oral Once Gaynelle Adu, MD       bupivacaine liposome (EXPAREL) 1.3 % injection 133 mg  10 mL Infiltration Once Zonia Kief M, PA-C       ceFAZolin (ANCEF) 2-4 GM/100ML-% IVPB            ceFAZolin (ANCEF) IVPB 2g/100 mL premix  2 g Intravenous On Call to OR Naida Sleight, PA-C       insulin aspart (novoLOG) injection 0-14 Units  0-14 Units Subcutaneous Q2H PRN Gaynelle Adu, MD       lactated ringers infusion   Intravenous Continuous Ellender, Catheryn Bacon, MD       tranexamic  acid (CYKLOKAPRON) 1000MG /159mL IVPB            tranexamic acid (CYKLOKAPRON) IVPB 1,000 mg  1,000 mg Intravenous To OR 80m, PA-C       No Known Allergies  Social History   Tobacco Use   Smoking status: Never   Smokeless tobacco: Never  Substance Use Topics   Alcohol use: Yes    Comment: stopped heavily drinking daily in 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  HENT: Negative.    Respiratory: Negative.    Cardiovascular: Negative.   Gastrointestinal: Negative.   Genitourinary: Negative.   Musculoskeletal:  Positive for gait problem.  Psychiatric/Behavioral: Negative.      Objective:  Physical Exam HENT:     Head: Normocephalic and atraumatic.     Nose: Nose normal.  Eyes:     Extraocular Movements: Extraocular movements intact.  Cardiovascular:     Heart sounds: Normal heart sounds. No murmur heard. Pulmonary:     Breath sounds: Normal breath sounds.  Neurological:     Mental Status: He is alert.  Psychiatric:        Mood and Affect: Mood normal.     Vital signs in last 24 hours: Temp:  [98.1 F (36.7 C)] 98.1 F (36.7 C) (07/17 0540) Pulse Rate:  [68] 68 (07/17 0540) Resp:  [17] 17 (07/17 0540) BP: (162)/(91) 162/91 (07/17 0540) SpO2:  [95 %] 95 % (07/17 0540) Weight:  [111.1 kg] 111.1 kg (07/17 0540)  Labs:   Estimated body mass index is 37.25 kg/m as calculated from the following:   Height as of this encounter: 5\' 8"  (1.727 m).   Weight as of this encounter: 111.1 kg.   Imaging Review Plain radiographs demonstrate moderate degenerative joint disease of the right hip(s). The bone quality appears to be good for age and reported activity level.      Assessment/Plan:  End stage arthritis, right hip(s)  The patient history, physical examination, clinical judgement of the provider and imaging studies are consistent with end stage degenerative joint disease of the right hip(s) and total hip arthroplasty is deemed medically necessary. The treatment options including medical management, injection therapy, arthroscopy and arthroplasty were discussed at length. The risks and benefits of total hip arthroplasty were presented and reviewed. The risks due to aseptic loosening, infection, stiffness, dislocation/subluxation,  thromboembolic complications and other imponderables  were discussed.  The patient acknowledged the explanation, agreed to proceed with the plan and consent was signed. Patient is being admitted for inpatient treatment for surgery, pain control, PT, OT, prophylactic antibiotics, VTE prophylaxis, progressive ambulation and ADL's and discharge planning.The patient is planning to be discharged home with home health services  We will proceed with surgery.  Patient states that he would like home health PT along with a home health aide to come out after his surgery.  I advised patient that I think that this is a good idea and it will help with his recovery.  All questions answered.

## 2021-08-03 NOTE — Evaluation (Signed)
Physical Therapy Evaluation Patient Details Name: Christopher Hurst. MRN: [id number] DOB: 1965-02-12 Today's Date: 08/03/2021  History of Present Illness  Pt is a 56 y/o male s/p R THA, direct anterior. PMH includes DM, HTN, gout, and L THA.  Clinical Impression  Pt admitted secondary to problem above with deficits below. Pt requiring min guard A to min A for mobility tasks this session. Reporting increased nausea which limited mobility. Anticipate pt will progress well once symptoms improve. Is interested in HHPT at d/c. Will continue to follow acutely.        Recommendations for follow up therapy are one component of a multi-disciplinary discharge planning process, led by the attending physician.  Recommendations may be updated based on patient status, additional functional criteria and insurance authorization.  Follow Up Recommendations Follow physician's recommendations for discharge plan and follow up therapies      Assistance Recommended at Discharge Intermittent Supervision/Assistance  Patient can return home with the following  Assist for transportation;Assistance with cooking/housework;A little help with bathing/dressing/bathroom    Equipment Recommendations BSC/3in1  Recommendations for Other Services       Functional Status Assessment Patient has had a recent decline in their functional status and demonstrates the ability to make significant improvements in function in a reasonable and predictable amount of time.     Precautions / Restrictions Precautions Precautions: Fall Restrictions Weight Bearing Restrictions: Yes RLE Weight Bearing: Weight bearing as tolerated      Mobility  Bed Mobility Overal bed mobility: Needs Assistance Bed Mobility: Supine to Sit     Supine to sit: Min assist     General bed mobility comments: Assist for RLE management.    Transfers Overall transfer level: Needs assistance Equipment used: Rolling walker (2 wheels) Transfers:  Bed to chair/wheelchair/BSC, Sit to/from Stand Sit to Stand: Min guard Stand pivot transfers: Min guard         General transfer comment: Min guard for safety to stand and transfer from bed to chair. Pt with increased nausea, so further mobility deferred.    Ambulation/Gait                  Stairs            Wheelchair Mobility    Modified Rankin (Stroke Patients Only)       Balance Overall balance assessment: Needs assistance Sitting-balance support: No upper extremity supported, Feet supported Sitting balance-Leahy Scale: Fair     Standing balance support: Bilateral upper extremity supported Standing balance-Leahy Scale: Poor Standing balance comment: Reliant on BUE support                             Pertinent Vitals/Pain Pain Assessment Pain Assessment: 0-10 Pain Score: 7  Pain Location: R hip Pain Descriptors / Indicators: Grimacing, Guarding Pain Intervention(s): Limited activity within patient's tolerance, Monitored during session, Repositioned    Home Living Family/patient expects to be discharged to:: Private residence Living Arrangements: Spouse/significant other;Children Available Help at Discharge: Family Type of Home: Apartment Home Access: Level entry       Home Layout: One level Home Equipment: Agricultural consultant (2 wheels);Cane - single point;Shower seat      Prior Function Prior Level of Function : Independent/Modified Independent             Mobility Comments: Used cane for ambulation       Hand Dominance        Extremity/Trunk Assessment  Upper Extremity Assessment Upper Extremity Assessment: Overall WFL for tasks assessed    Lower Extremity Assessment Lower Extremity Assessment: RLE deficits/detail RLE Deficits / Details: Deficits consistent with post op pain and weakness.    Cervical / Trunk Assessment Cervical / Trunk Assessment: Normal  Communication   Communication: No difficulties   Cognition Arousal/Alertness: Awake/alert Behavior During Therapy: WFL for tasks assessed/performed Overall Cognitive Status: Within Functional Limits for tasks assessed                                          General Comments      Exercises     Assessment/Plan    PT Assessment Patient needs continued PT services  PT Problem List Decreased strength;Decreased range of motion;Decreased activity tolerance;Decreased balance;Decreased mobility;Decreased knowledge of use of DME;Decreased knowledge of precautions;Pain       PT Treatment Interventions DME instruction;Gait training;Stair training;Functional mobility training;Therapeutic activities;Therapeutic exercise;Balance training;Patient/family education    PT Goals (Current goals can be found in the Care Plan section)  Acute Rehab PT Goals Patient Stated Goal: to go home PT Goal Formulation: With patient Time For Goal Achievement: 08/17/21 Potential to Achieve Goals: Good    Frequency 7X/week     Co-evaluation               AM-PAC PT "6 Clicks" Mobility  Outcome Measure Help needed turning from your back to your side while in a flat bed without using bedrails?: A Little Help needed moving from lying on your back to sitting on the side of a flat bed without using bedrails?: A Little Help needed moving to and from a bed to a chair (including a wheelchair)?: A Little Help needed standing up from a chair using your arms (e.g., wheelchair or bedside chair)?: A Little Help needed to walk in hospital room?: A Little Help needed climbing 3-5 steps with a railing? : A Lot 6 Click Score: 17    End of Session Equipment Utilized During Treatment: Gait belt Activity Tolerance: Treatment limited secondary to medical complications (Comment) (nausea) Patient left: in chair;with call bell/phone within reach Nurse Communication: Mobility status PT Visit Diagnosis: Other abnormalities of gait and mobility  (R26.89);Pain;Difficulty in walking, not elsewhere classified (R26.2) Pain - Right/Left: Right Pain - part of body: Hip    Time: 4944-9675 PT Time Calculation (min) (ACUTE ONLY): 15 min   Charges:   PT Evaluation $PT Eval Low Complexity: 1 Low          [name], DPT  Acute Rehabilitation Services  Office: [phone]   [name] 08/03/2021, 4:24 PM

## 2021-08-03 NOTE — Anesthesia Postprocedure Evaluation (Signed)
Anesthesia Post Note  Patient: Gibril Mastro.  Procedure(s) Performed: Right TOTAL HIP ARTHROPLASTY ANTERIOR APPROACH (Right: Hip)     Patient location during evaluation: PACU Anesthesia Type: Spinal Level of consciousness: oriented and awake and alert Pain management: pain level controlled Vital Signs Assessment: post-procedure vital signs reviewed and stable Respiratory status: spontaneous breathing and respiratory function stable Cardiovascular status: blood pressure returned to baseline and stable Postop Assessment: no headache, no backache, no apparent nausea or vomiting, spinal receding and patient able to bend at knees Anesthetic complications: no   No notable events documented.  Last Vitals:  Vitals:   08/03/21 1045 08/03/21 1119  BP: 132/80 (!) 143/85  Pulse: 62 62  Resp: 11 12  Temp: 36.5 C 36.6 C  SpO2: 94% 99%    Last Pain:  Vitals:   08/03/21 1119  TempSrc:   PainSc: 4                  Haidee Stogsdill,W. EDMOND

## 2021-08-04 ENCOUNTER — Encounter (HOSPITAL_COMMUNITY): Payer: Self-pay | Admitting: Orthopaedic Surgery

## 2021-08-04 DIAGNOSIS — M1611 Unilateral primary osteoarthritis, right hip: Secondary | ICD-10-CM | POA: Diagnosis not present

## 2021-08-04 LAB — BASIC METABOLIC PANEL
Anion gap: 7 (ref 5–15)
BUN: 9 mg/dL (ref 6–20)
CO2: 25 mmol/L (ref 22–32)
Calcium: 8.8 mg/dL — ABNORMAL LOW (ref 8.9–10.3)
Chloride: 104 mmol/L (ref 98–111)
Creatinine, Ser: 0.71 mg/dL (ref 0.61–1.24)
GFR, Estimated: >60 mL/min (ref >60)
Glucose, Bld: 145 mg/dL — ABNORMAL HIGH (ref 70–99)
Potassium: 4.2 mmol/L (ref 3.5–5.1)
Sodium: 136 mmol/L (ref 135–145)

## 2021-08-04 LAB — CBC
HCT: 35.8 % — ABNORMAL LOW (ref 39.0–52.0)
Hemoglobin: 11.7 g/dL — ABNORMAL LOW (ref 13.0–17.0)
MCH: 28.5 pg (ref 26.0–34.0)
MCHC: 32.7 g/dL (ref 30.0–36.0)
MCV: 87.1 fL (ref 80.0–100.0)
Platelets: 210 10*3/uL (ref 150–400)
RBC: 4.11 MIL/uL — ABNORMAL LOW (ref 4.22–5.81)
RDW: 14 % (ref 11.5–15.5)
WBC: 7.7 10*3/uL (ref 4.0–10.5)
nRBC: 0 % (ref 0.0–0.2)

## 2021-08-04 LAB — GLUCOSE, CAPILLARY
Glucose-Capillary: 136 mg/dL — ABNORMAL HIGH (ref 70–99)
Glucose-Capillary: 160 mg/dL — ABNORMAL HIGH (ref 70–99)
Glucose-Capillary: 98 mg/dL (ref 70–99)
Glucose-Capillary: 193 mg/dL — ABNORMAL HIGH (ref 70–99)

## 2021-08-04 NOTE — Progress Notes (Addendum)
Physical Therapy Treatment Patient Details Name: Christopher Hurst. MRN: [id number] DOB: [birth year] Today's Date: 08/04/2021   History of Present Illness Pt is a 56 y/o male s/p R THA, direct anterior. PMH includes DM, HTN, gout, and L THA.    PT Comments    Pt able to progress to greater distance with gait. RW adjusted to higher position as pt slumping and lower than ideal position. Pt able to recall therapeutic exercises from earlier. Progress as tolerated.   Recommendations for follow up therapy are one component of a multi-disciplinary discharge planning process, led by the attending physician.  Recommendations may be updated based on patient status, additional functional criteria and insurance authorization.  Follow Up Recommendations  Follow physician's recommendations for discharge plan and follow up therapies     Assistance Recommended at Discharge Intermittent Supervision/Assistance  Patient can return home with the following Assist for transportation;Assistance with cooking/housework;A little help with bathing/dressing/bathroom   Equipment Recommendations  BSC/3in1    Recommendations for Other Services       Precautions / Restrictions Precautions Precautions: Fall Restrictions Weight Bearing Restrictions: Yes RLE Weight Bearing: Weight bearing as tolerated     Mobility  Bed Mobility Overal bed mobility: Needs Assistance Bed Mobility: Supine to Sit     Supine to sit: Min assist, HOB elevated     General bed mobility comments: Pt up in chair.    Transfers Overall transfer level: Needs assistance Equipment used: Rolling walker (2 wheels) Transfers: Sit to/from Stand Sit to Stand: Supervision (standby A for safety)           General transfer comment: Min guard for safety to stand.    Ambulation/Gait Ambulation/Gait assistance: Min guard, Supervision (standby A to CGA) Gait Distance (Feet): 280 Feet Assistive device: Rolling walker (2 wheels) Gait  Pattern/deviations: Step-to pattern, Decreased stance time - right, Decreased step length - left, Decreased step length - right, Antalgic   Gait velocity interpretation: 1.31 - 2.62 ft/sec, indicative of limited community ambulator   General Gait Details: Pt performed modified three point (step to) pattern as instructed. Pt with no LOB and he required no rest breaks with exception of standing for RW to be adjusted higher.   Stairs General stair comments: pt reported no need for additional practice     Balance Overall balance assessment: Needs assistance Sitting-balance support: No upper extremity supported, Feet supported Sitting balance-Leahy Scale: Fair     Standing balance support: Bilateral upper extremity supported Standing balance-Leahy Scale: Poor Standing balance comment: Reliant on BUE support                            Cognition Arousal/Alertness: Awake/alert Behavior During Therapy: WFL for tasks assessed/performed Overall Cognitive Status: Within Functional Limits for tasks assessed                                        Exercises General Exercises - Lower Extremity Ankle Circles/Pumps:  (verbalized understanding and able to teach-back) Quad Sets:  (verbalized understanding and able to teach-back) Short Arc Quad:  (verbalized understanding and able to teach-back)    General Comments General comments (skin integrity, edema, etc.): SpO2 95% on RA      Pertinent Vitals/Pain Pain Assessment Pain Assessment: 0-10 Pain Score: 8  Pain Location: R hip Pain Descriptors / Indicators: Grimacing, Guarding ("irritating") Pain Intervention(s): Limited activity  within patient's tolerance, Monitored during session    Home Living                          Prior Function            PT Goals (current goals can now be found in the care plan section) Acute Rehab PT Goals Patient Stated Goal: to go home PT Goal Formulation: With  patient Time For Goal Achievement: 08/17/21 Potential to Achieve Goals: Good Progress towards PT goals: Progressing toward goals    Frequency    7X/week      PT Plan Current plan remains appropriate    Co-evaluation              AM-PAC PT "6 Clicks" Mobility   Outcome Measure  Help needed turning from your back to your side while in a flat bed without using bedrails?: A Little Help needed moving from lying on your back to sitting on the side of a flat bed without using bedrails?: A Little Help needed moving to and from a bed to a chair (including a wheelchair)?: A Little Help needed standing up from a chair using your arms (e.g., wheelchair or bedside chair)?: A Little Help needed to walk in hospital room?: A Little Help needed climbing 3-5 steps with a railing? : A Little 6 Click Score: 18    End of Session Equipment Utilized During Treatment: Gait belt Activity Tolerance: Patient tolerated treatment well Patient left: in chair;with call bell/phone within reach;with family/visitor present Nurse Communication: Mobility status PT Visit Diagnosis: Other abnormalities of gait and mobility (R26.89);Pain;Difficulty in walking, not elsewhere classified (R26.2) Pain - Right/Left: Right Pain - part of body: Hip     Time: 1204-1218 PT Time Calculation (min) (ACUTE ONLY): 14 min  Charges:  $Gait Training: 8-22 mins $Therapeutic Exercise: 8-22 mins $Therapeutic Activity: 8-22 mins                     Tana Coast, PT    [employer] 08/04/2021, 12:26 PM

## 2021-08-04 NOTE — TOC Initial Note (Signed)
Transition of Care (TOC) - Initial/Assessment Note    Patient Details  Name: Christopher Hurst. MRN: [id number] Date of Birth: 09-24-1965  Transition of Care [facility]) CM/SW Contact:    Epifanio Lesches, RN Phone Number: 08/04/2021, 9:27 PM  Clinical Narrative:                 Pt s/p R THA, 08/03/2021 . From home with wife. States prior to admit independent with ADL's.  NCM spoke with pt regarding D/C planning. Shared order noted for HHPT services. Pt states not interested. States will ambulate throughout the day with family assistance once d/c. Order noted for DME: 3in1/ BSC. Referral made with [employer]. Equipment will be delivered to bedside prior to d/c.  [facility] team following and will assist with needs....  Expected Discharge Plan: Home/Self Care (declined home health services) Barriers to Discharge: Continued Medical Work up   Patient Goals and CMS Choice     Choice offered to / list presented to : Patient  Expected Discharge Plan and Services Expected Discharge Plan: Home/Self Care (declined home health services)   Discharge Planning Services: CM Consult     Expected Discharge Date: 08/04/21               DME Arranged: 3-N-1 DME Agency: [employer] Date DME Agency Contacted: 08/04/21 Time DME Agency Contacted: 1316 Representative spoke with at DME Agency: Duffy Bruce            Prior Living Arrangements/Services   Lives with:: Spouse, Adult Children Patient language and need for interpreter reviewed:: Yes Do you feel safe going back to the place where you live?: Yes      Need for Family Participation in Patient Care: Yes (Comment) Care giver support system in place?: Yes (comment)   Criminal Activity/Legal Involvement Pertinent to Current Situation/Hospitalization: No - Comment as needed  Activities of Daily Living Home Assistive Devices/Equipment: CBG Meter, Cane (specify quad or straight), Scales, Walker (specify type) ADL Screening (condition at time of  admission) Patient's cognitive ability adequate to safely complete daily activities?: Yes Is the patient deaf or have difficulty hearing?: No Does the patient have difficulty seeing, even when wearing glasses/contacts?: No Does the patient have difficulty concentrating, remembering, or making decisions?: No Patient able to express need for assistance with ADLs?: Yes Does the patient have difficulty dressing or bathing?: No Independently performs ADLs?: Yes (appropriate for developmental age) Does the patient have difficulty walking or climbing stairs?: Yes Weakness of Legs: None Weakness of Arms/Hands: None  Permission Sought/Granted                  Emotional Assessment Appearance:: Appears stated age Attitude/Demeanor/Rapport: Engaged Affect (typically observed): Accepting Orientation: : Oriented to Self, Oriented to Place, Oriented to  Time, Oriented to Situation Alcohol / Substance Use: Not Applicable Psych Involvement: No (comment)  Admission diagnosis:  Arthritis of right hip [M16.11] Patient Active Problem List   Diagnosis Date Noted   Arthritis of right hip 08/03/2021   Unilateral primary osteoarthritis, right hip 02/17/2021   S/P total left hip arthroplasty 10/24/2019   Diabetes mellitus without complication (HCC)    S/P cervical spinal fusion 05/02/2018   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

## 2021-08-04 NOTE — Progress Notes (Signed)
Physical Therapy Treatment Patient Details Name: Christopher Hurst. MRN: [id number] DOB: [birth year] Today's Date: 08/04/2021   History of Present Illness Pt is a 56 y/o male s/p R THA, direct anterior. PMH includes DM, HTN, gout, and L THA.    PT Comments    Pt instructed in and performed gait training, therapeutic exercises, and step training. Pt with minimal difficulty with bed mobility but plans to sleep in his recliner at home. Pt with no LOB during mobility. Pt progressing well towards goals.    Recommendations for follow up therapy are one component of a multi-disciplinary discharge planning process, led by the attending physician.  Recommendations may be updated based on patient status, additional functional criteria and insurance authorization.  Follow Up Recommendations  Follow physician's recommendations for discharge plan and follow up therapies     Assistance Recommended at Discharge Intermittent Supervision/Assistance  Patient can return home with the following Assist for transportation;Assistance with cooking/housework;A little help with bathing/dressing/bathroom   Equipment Recommendations  BSC/3in1    Recommendations for Other Services       Precautions / Restrictions Precautions Precautions: Fall Restrictions Weight Bearing Restrictions: Yes RLE Weight Bearing: Weight bearing as tolerated     Mobility  Bed Mobility Overal bed mobility: Needs Assistance Bed Mobility: Supine to Sit     Supine to sit: Min assist, HOB elevated     General bed mobility comments: Assist for RLE management. Pt also required use of bed rail.    Transfers Overall transfer level: Needs assistance Equipment used: Rolling walker (2 wheels) Transfers: Sit to/from Stand Sit to Stand: Min guard           General transfer comment: Min guard for safety to stand.    Ambulation/Gait Ambulation/Gait assistance: Min guard, Supervision Gait Distance (Feet): 250 Feet (125 x  2) Assistive device: Rolling walker (2 wheels) Gait Pattern/deviations: Step-to pattern, Decreased stance time - right, Decreased step length - left, Decreased step length - right, Antalgic   Gait velocity interpretation: 1.31 - 2.62 ft/sec, indicative of limited community ambulator   General Gait Details: Pt performed modified three point (step to) pattern as instructed. Pt took brief standing rest break after first 125 feet for step training. Pt required increased cues for sequencing.   Stairs Stairs: Yes Stairs assistance: Min guard Stair Management: No rails, Step to pattern, With walker Number of Stairs: 1 General stair comments: Pt ascended/descended platform step with RW as instructed. Adequate stability present.   Wheelchair Mobility    Modified Rankin (Stroke Patients Only)       Balance Overall balance assessment: Needs assistance Sitting-balance support: No upper extremity supported, Feet supported Sitting balance-Leahy Scale: Fair     Standing balance support: Bilateral upper extremity supported Standing balance-Leahy Scale: Poor Standing balance comment: Reliant on BUE support                            Cognition Arousal/Alertness: Awake/alert Behavior During Therapy: Anxious Overall Cognitive Status: Within Functional Limits for tasks assessed                                 General Comments: Pt O x 4.        Exercises General Exercises - Lower Extremity Ankle Circles/Pumps: Both, 20 reps, Supine Quad Sets: Both, 10 reps, Supine (5 sec holds) Short Arc Quad: Right, 10 reps, Supine Hip ABduction/ADduction: Other (  comment) (pt unable to perform gravity eliminated hip abd on R) Straight Leg Raises: Other (comment) (pt unable on R)    General Comments General comments (skin integrity, edema, etc.): SpO2 93% on RA      Pertinent Vitals/Pain Pain Assessment Pain Assessment: 0-10 Pain Score: 3  Pain Location: R hip Pain  Descriptors / Indicators: Grimacing, Guarding ("irritating") Pain Intervention(s): Limited activity within patient's tolerance, Monitored during session, Ice applied    Home Living                          Prior Function            PT Goals (current goals can now be found in the care plan section) Acute Rehab PT Goals Patient Stated Goal: to go home PT Goal Formulation: With patient Time For Goal Achievement: 08/17/21 Potential to Achieve Goals: Good Progress towards PT goals: Progressing toward goals    Frequency    7X/week      PT Plan Current plan remains appropriate    Co-evaluation              AM-PAC PT "6 Clicks" Mobility   Outcome Measure  Help needed turning from your back to your side while in a flat bed without using bedrails?: A Little Help needed moving from lying on your back to sitting on the side of a flat bed without using bedrails?: A Little Help needed moving to and from a bed to a chair (including a wheelchair)?: A Little Help needed standing up from a chair using your arms (e.g., wheelchair or bedside chair)?: A Little Help needed to walk in hospital room?: A Little Help needed climbing 3-5 steps with a railing? : A Little 6 Click Score: 18    End of Session Equipment Utilized During Treatment: Gait belt Activity Tolerance: Patient tolerated treatment well;Patient limited by pain (nausea) Patient left: in chair;with call bell/phone within reach Nurse Communication: Mobility status PT Visit Diagnosis: Other abnormalities of gait and mobility (R26.89);Pain;Difficulty in walking, not elsewhere classified (R26.2) Pain - Right/Left: Right Pain - part of body: Hip     Time: 0826-0908 PT Time Calculation (min) (ACUTE ONLY): 42 min  Charges:  $Gait Training: 8-22 mins $Therapeutic Exercise: 8-22 mins $Therapeutic Activity: 8-22 mins                     Tana Coast, PT    [employer] 08/04/2021, 10:56 AM

## 2021-08-05 DIAGNOSIS — M1611 Unilateral primary osteoarthritis, right hip: Secondary | ICD-10-CM | POA: Diagnosis not present

## 2021-08-05 LAB — GLUCOSE, CAPILLARY
Glucose-Capillary: 126 mg/dL — ABNORMAL HIGH (ref 70–99)
Glucose-Capillary: 122 mg/dL — ABNORMAL HIGH (ref 70–99)
Glucose-Capillary: 144 mg/dL — ABNORMAL HIGH (ref 70–99)

## 2021-08-05 LAB — CBC
HCT: 32.1 % — ABNORMAL LOW (ref 39.0–52.0)
Hemoglobin: 10.6 g/dL — ABNORMAL LOW (ref 13.0–17.0)
MCH: 28.3 pg (ref 26.0–34.0)
MCHC: 33 g/dL (ref 30.0–36.0)
MCV: 85.8 fL (ref 80.0–100.0)
Platelets: 225 10*3/uL (ref 150–400)
RBC: 3.74 MIL/uL — ABNORMAL LOW (ref 4.22–5.81)
RDW: 14.3 % (ref 11.5–15.5)
WBC: 7.6 10*3/uL (ref 4.0–10.5)
nRBC: 0 % (ref 0.0–0.2)

## 2021-08-05 NOTE — Progress Notes (Signed)
Patient ID: Christopher Jacks., male   DOB: 01/02/1966, 55 y.o.   MRN: [id number] Nausea and vomiting is resolved he is able to keep dinner down.  Ambulating better.  Patient states he has a ride to pick him up at 6 PM this evening.  Patient was plan for discharge yesterday however due to nausea and vomiting inability to keep food and liquids down stayed until today and will be discharged this afternoon.

## 2021-08-05 NOTE — Progress Notes (Signed)
Physical Therapy Treatment Patient Details Name: Christopher Hurst. MRN: [id number] DOB: 05-09-1965 Today's Date: 08/05/2021   History of Present Illness Pt is a 56 y/o male s/p R THA, direct anterior. PMH includes DM, HTN, gout, and L THA.    PT Comments    Pt continuing to ambulate well this session, 231ft with supervision and RW. PT continued education on importance of HEP and frequent mobilization once home as pt complaining of increased stiffness in the hip this session. Pt eager to return home and had no further questions/concerns at this time.    Recommendations for follow up therapy are one component of a multi-disciplinary discharge planning process, led by the attending physician.  Recommendations may be updated based on patient status, additional functional criteria and insurance authorization.  Follow Up Recommendations  Follow physician's recommendations for discharge plan and follow up therapies     Assistance Recommended at Discharge PRN  Patient can return home with the following Assist for transportation;Assistance with cooking/housework;A little help with bathing/dressing/bathroom   Equipment Recommendations  BSC/3in1    Recommendations for Other Services       Precautions / Restrictions Precautions Precautions: Fall Restrictions Weight Bearing Restrictions: Yes RLE Weight Bearing: Weight bearing as tolerated     Mobility  Bed Mobility Overal bed mobility: Needs Assistance Bed Mobility: Supine to Sit, Sit to Supine     Supine to sit: Min guard, Min assist Sit to supine: Min guard, Min assist   General bed mobility comments: assist required to place gait belt on foot but able then min guard for bed mobility with increased time, HOB elevated, and use of bed rails    Transfers Overall transfer level: Needs assistance Equipment used: Rolling walker (2 wheels) Transfers: Sit to/from Stand Sit to Stand: Min guard           General transfer comment:  requiring multiple attempts to achieve full stand, min guard for safety    Ambulation/Gait Ambulation/Gait assistance: Supervision Gait Distance (Feet): 250 Feet Assistive device: Rolling walker (2 wheels) Gait Pattern/deviations: Step-through pattern, Antalgic, Trunk flexed Gait velocity: functional Gait velocity interpretation: >2.62 ft/sec, indicative of community ambulatory   General Gait Details: able to progress to step-through pattern, remains with trunk flexed despite cueing   Stairs             Wheelchair Mobility    Modified Rankin (Stroke Patients Only)       Balance Overall balance assessment: Modified Independent                                          Cognition Arousal/Alertness: Awake/alert Behavior During Therapy: WFL for tasks assessed/performed Overall Cognitive Status: Within Functional Limits for tasks assessed                                          Exercises      General Comments        Pertinent Vitals/Pain Pain Assessment Pain Assessment: No/denies pain    Home Living                          Prior Function            PT Goals (current goals can now be found in the  care plan section) Acute Rehab PT Goals Patient Stated Goal: to go home PT Goal Formulation: With patient Time For Goal Achievement: 08/17/21 Potential to Achieve Goals: Good Progress towards PT goals: Progressing toward goals    Frequency    7X/week      PT Plan Current plan remains appropriate    Co-evaluation              AM-PAC PT "6 Clicks" Mobility   Outcome Measure  Help needed turning from your back to your side while in a flat bed without using bedrails?: None Help needed moving from lying on your back to sitting on the side of a flat bed without using bedrails?: A Little Help needed moving to and from a bed to a chair (including a wheelchair)?: A Little Help needed standing up from a  chair using your arms (e.g., wheelchair or bedside chair)?: A Little Help needed to walk in hospital room?: A Little Help needed climbing 3-5 steps with a railing? : A Little 6 Click Score: 19    End of Session   Activity Tolerance: Patient tolerated treatment well Patient left: in bed;with call bell/phone within reach Nurse Communication: Mobility status PT Visit Diagnosis: Other abnormalities of gait and mobility (R26.89);Pain;Difficulty in walking, not elsewhere classified (R26.2) Pain - Right/Left: Right Pain - part of body: Hip     Time: 1761-6073 PT Time Calculation (min) (ACUTE ONLY): 14 min  Charges:  $Gait Training: 8-22 mins                     Davina Poke, SPT Acute Rehabilitation Services  Office: [phone]    Davina Poke 08/05/2021, 4:38 PM

## 2021-08-05 NOTE — Progress Notes (Signed)
Physical Therapy Treatment Patient Details Name: Christopher Hurst. MRN: [id number] DOB: [birth year] Today's Date: 08/05/2021   History of Present Illness Pt is a 56 y/o male s/p R THA, direct anterior. PMH includes DM, HTN, gout, and L THA.    PT Comments    Pt demonstrating good tolerance to activity this session. Min guard for transfers and supervision for ambulation of 393ft with RW. Pt stating he feels comfortable with stair negotiation and denying need for further practice. Pt educated on importance of frequent mobilization and continuing HEP upon discharge. PT will follow up this afternoon if pt has not been discharged.    Recommendations for follow up therapy are one component of a multi-disciplinary discharge planning process, led by the attending physician.  Recommendations may be updated based on patient status, additional functional criteria and insurance authorization.  Follow Up Recommendations  Follow physician's recommendations for discharge plan and follow up therapies     Assistance Recommended at Discharge PRN  Patient can return home with the following Assist for transportation;Assistance with cooking/housework;A little help with bathing/dressing/bathroom   Equipment Recommendations  BSC/3in1    Recommendations for Other Services       Precautions / Restrictions Precautions Precautions: Fall Restrictions Weight Bearing Restrictions: Yes RLE Weight Bearing: Weight bearing as tolerated     Mobility  Bed Mobility Overal bed mobility: Needs Assistance Bed Mobility: Supine to Sit, Sit to Supine     Supine to sit: Min assist Sit to supine: Min assist   General bed mobility comments: provided with gait belt to assist with RLE, requiring assist to place gait belt on foot and minA for RLE to initiate movement    Transfers Overall transfer level: Needs assistance Equipment used: Rolling walker (2 wheels) Transfers: Sit to/from Stand Sit to Stand: Min  guard           General transfer comment: requiring multiple attempts to achieve full stand, min guard for safety    Ambulation/Gait Ambulation/Gait assistance: Supervision Gait Distance (Feet): 300 Feet Assistive device: Rolling walker (2 wheels) Gait Pattern/deviations: Step-to pattern, Antalgic Gait velocity: functional Gait velocity interpretation: >2.62 ft/sec, indicative of community ambulatory   General Gait Details: demonstrating good balance and sequencing throughout, cues for upright posture   Stairs             Wheelchair Mobility    Modified Rankin (Stroke Patients Only)       Balance Overall balance assessment: Modified Independent                                          Cognition Arousal/Alertness: Awake/alert Behavior During Therapy: WFL for tasks assessed/performed Overall Cognitive Status: Within Functional Limits for tasks assessed                                          Exercises      General Comments        Pertinent Vitals/Pain Pain Assessment Pain Assessment: No/denies pain    Home Living                          Prior Function            PT Goals (current goals can now be found in the care  plan section) Acute Rehab PT Goals Patient Stated Goal: to go home PT Goal Formulation: With patient Time For Goal Achievement: 08/17/21 Potential to Achieve Goals: Good Progress towards PT goals: Progressing toward goals    Frequency    7X/week      PT Plan Current plan remains appropriate    Co-evaluation              AM-PAC PT "6 Clicks" Mobility   Outcome Measure  Help needed turning from your back to your side while in a flat bed without using bedrails?: None Help needed moving from lying on your back to sitting on the side of a flat bed without using bedrails?: A Little Help needed moving to and from a bed to a chair (including a wheelchair)?: A Little Help  needed standing up from a chair using your arms (e.g., wheelchair or bedside chair)?: A Little Help needed to walk in hospital room?: A Little Help needed climbing 3-5 steps with a railing? : A Little 6 Click Score: 19    End of Session   Activity Tolerance: Patient tolerated treatment well Patient left: in bed;with call bell/phone within reach Nurse Communication: Mobility status PT Visit Diagnosis: Other abnormalities of gait and mobility (R26.89);Pain;Difficulty in walking, not elsewhere classified (R26.2) Pain - Right/Left: Right Pain - part of body: Hip     Time: 4136-4383 PT Time Calculation (min) (ACUTE ONLY): 22 min  Charges:  $Gait Training: 8-22 mins                     Davina Poke, SPT Acute Rehabilitation Services  Office: [phone]    Davina Poke 08/05/2021, 10:10 AM

## 2021-08-05 NOTE — Progress Notes (Signed)
Discussed discharge instructions with medication details. Verbalized understanding. Patient was alert and oriented at time of instruction.

## 2021-08-05 NOTE — Progress Notes (Signed)
Patient ID: Christopher Jacks., male   DOB: March 02, 1965, 56 y.o.   MRN: [id number] Did well with therapy. Hgb stable. Dressing dry. Discharge home.

## 2021-08-06 ENCOUNTER — Telehealth: Payer: Self-pay | Admitting: Orthopaedic Surgery

## 2021-08-06 NOTE — Telephone Encounter (Signed)
Pt called requesting a referral for a home health aide to come help him. Pt states he is having trouble getting up and down out of his chair and he need more than physical therapy to help him around the house. Please call pt about this matter at [phone]

## 2021-08-09 ENCOUNTER — Encounter: Payer: Self-pay | Admitting: Orthopaedic Surgery

## 2021-08-11 ENCOUNTER — Ambulatory Visit: Payer: Self-pay

## 2021-08-11 ENCOUNTER — Encounter: Payer: Self-pay | Admitting: Orthopaedic Surgery

## 2021-08-11 ENCOUNTER — Ambulatory Visit (INDEPENDENT_AMBULATORY_CARE_PROVIDER_SITE_OTHER): Payer: Medicare PPO | Admitting: Orthopaedic Surgery

## 2021-08-11 VITALS — BP 127/86 | HR 80 | Ht 68.0 in | Wt 242.0 lb

## 2021-08-11 DIAGNOSIS — Z96641 Presence of right artificial hip joint: Secondary | ICD-10-CM

## 2021-08-11 NOTE — Progress Notes (Signed)
Post-Op Visit Note   Patient: Christopher Hurst.           Date of Birth: 08-Oct-1965           MRN: [id number] Visit Date: 08/11/2021 PCP: Leane Call, PA-C   Assessment & Plan: Postop right total hip arthroplasty he notes that this 1 has been much more sore and moving slower than the opposite hip that he had in the past.  Top portion of incision has trace bloody drainage new dressing applied he will return in 1 week for staple removal and Steri-Strip application.  He will continue with walking and continue to gradually work on quad strengthening.  Chief Complaint:  Chief Complaint  Patient presents with   Right Hip - Routine Post Op   Visit Diagnoses:  1. Status post right hip replacement     Plan: ROV one week  Follow-Up Instructions: No follow-ups on file.   Orders:  Orders Placed This Encounter  Procedures   XR HIP UNILAT W OR W/O PELVIS 2-3 VIEWS RIGHT   No orders of the defined types were placed in this encounter.   Imaging: No results found.  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/2019   Diabetes mellitus without complication (HCC)    S/P cervical spinal fusion 05/02/2018   Other bilateral secondary osteoarthritis of knee 07/19/2017   Spondylosis of thoracic region without myelopathy or radiculopathy 07/19/2017   Midline low back pain without sciatica 04/13/2017   Obesity    Essential hypertension    High cholesterol    Gout    Past Medical History:  Diagnosis Date   Acid reflux    Allergies    Anxiety    per pt, no medications per patient   Arthritis    B/L hips   Diabetes mellitus without complication (HCC)    Gout    High cholesterol    Hypertension    Obesity    Pneumonia    as a kid    Family History  Problem Relation Age of Onset   Healthy Mother    Cirrhosis Father     Stroke Father    Alzheimer's disease Father    Alcohol abuse Father    Hypertension Brother     Past Surgical History:  Procedure Laterality Date   ANTERIOR CERVICAL DECOMP/DISCECTOMY FUSION N/A 03/17/2018   Procedure: CERVICAL FOUR-FIVE, CERVCAL FIVE-SIX ANTERIOR CERVICAL DECOMPRESSION/DISCECTOMY FUSION, ALLOGRAFT PLATE;  Surgeon: Eldred Manges, MD;  Location: [facility] OR;  Service: Orthopedics;  Laterality: N/A;   CYSTECTOMY     testicle    KNEE ARTHROSCOPY     left knee x2   SHOULDER ARTHROSCOPY     one left shoulder    TONSILLECTOMY     TOTAL HIP ARTHROPLASTY Left 09/14/2019   Procedure: LEFT TOTAL HIP ARTHROPLASTY -DIRECT ANTERIOR;  Surgeon: Eldred Manges, MD;  Location: [facility] OR;  Service: Orthopedics;  Laterality: Left;   TOTAL HIP ARTHROPLASTY Right 08/03/2021   Procedure: Right TOTAL HIP ARTHROPLASTY ANTERIOR APPROACH;  Surgeon: Eldred Manges, MD;  Location: [facility] OR;  Service: Orthopedics;  Laterality: Right;   Social History   Occupational History   Not on file  Tobacco Use   Smoking status: Never   Smokeless tobacco: Never  Vaping Use   Vaping Use: Never used  Substance and Sexual Activity  Alcohol use: Yes    Comment: stopped heavily drinking daily in 40s- drinks glass of wine once a year   Drug use: Not Currently    Types: Marijuana    Comment: in high school with friends   Sexual activity: Yes

## 2021-08-14 ENCOUNTER — Encounter: Payer: Self-pay | Admitting: Orthopaedic Surgery

## 2021-08-18 ENCOUNTER — Ambulatory Visit (INDEPENDENT_AMBULATORY_CARE_PROVIDER_SITE_OTHER): Payer: Medicare PPO | Admitting: Orthopaedic Surgery

## 2021-08-18 ENCOUNTER — Encounter: Payer: Self-pay | Admitting: Orthopaedic Surgery

## 2021-08-18 ENCOUNTER — Ambulatory Visit: Payer: Medicare PPO

## 2021-08-18 VITALS — BP 119/79 | HR 73 | Ht 68.0 in | Wt 242.0 lb

## 2021-08-18 DIAGNOSIS — Z981 Arthrodesis status: Secondary | ICD-10-CM

## 2021-08-18 DIAGNOSIS — G5621 Lesion of ulnar nerve, right upper limb: Secondary | ICD-10-CM

## 2021-08-18 DIAGNOSIS — R200 Anesthesia of skin: Secondary | ICD-10-CM

## 2021-08-18 DIAGNOSIS — Z96642 Presence of left artificial hip joint: Secondary | ICD-10-CM

## 2021-08-18 NOTE — Progress Notes (Unsigned)
   Post-Op Visit Note   Patient: Christopher Hurst.           Date of Birth: 1965-02-27           MRN: [id number] Visit Date: 08/18/2021 PCP: Leane Call, PA-C   Assessment & Plan:  Chief Complaint:  Chief Complaint  Patient presents with   Right Hip - Routine Post Op   Visit Diagnoses: No diagnosis found.  Plan: ***  Follow-Up Instructions: No follow-ups on file.   Orders:  No orders of the defined types were placed in this encounter.  No orders of the defined types were placed in this encounter.   Imaging: No results found.  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/2019   Diabetes mellitus without complication (HCC)    S/P cervical spinal fusion 05/02/2018   Other bilateral secondary osteoarthritis of knee 07/19/2017   Spondylosis of thoracic region without myelopathy or radiculopathy 07/19/2017   Midline low back pain without sciatica 04/13/2017   Obesity    Essential hypertension    High cholesterol    Gout    Past Medical History:  Diagnosis Date   Acid reflux    Allergies    Anxiety    per pt, no medications per patient   Arthritis    B/L hips   Diabetes mellitus without complication (HCC)    Gout    High cholesterol    Hypertension    Obesity    Pneumonia    as a kid    Family History  Problem Relation Age of Onset   Healthy Mother    Cirrhosis Father    Stroke Father    Alzheimer's disease Father    Alcohol abuse Father    Hypertension Brother     Past Surgical History:  Procedure Laterality Date   ANTERIOR CERVICAL DECOMP/DISCECTOMY FUSION N/A 03/17/2018   Procedure: CERVICAL FOUR-FIVE, CERVCAL FIVE-SIX ANTERIOR CERVICAL DECOMPRESSION/DISCECTOMY FUSION, ALLOGRAFT PLATE;  Surgeon: Eldred Manges, MD;  Location: [facility];  Service: Orthopedics;  Laterality: N/A;   CYSTECTOMY      testicle    KNEE ARTHROSCOPY     left knee x2   SHOULDER ARTHROSCOPY     one left shoulder    TONSILLECTOMY     TOTAL HIP ARTHROPLASTY Left 09/14/2019   Procedure: LEFT TOTAL HIP ARTHROPLASTY -DIRECT ANTERIOR;  Surgeon: Eldred Manges, MD;  Location: [facility];  Service: Orthopedics;  Laterality: Left;   TOTAL HIP ARTHROPLASTY Right 08/03/2021   Procedure: Right TOTAL HIP ARTHROPLASTY ANTERIOR APPROACH;  Surgeon: Eldred Manges, MD;  Location: [facility];  Service: Orthopedics;  Laterality: Right;   Social History   Occupational History   Not on file  Tobacco Use   Smoking status: Never   Smokeless tobacco: Never  Vaping Use   Vaping Use: Never used  Substance and Sexual Activity   Alcohol use: Yes    Comment: stopped heavily drinking daily in 40s- drinks glass of wine once a year   Drug use: Not Currently    Types: Marijuana    Comment: in high school with friends   Sexual activity: Yes

## 2021-08-19 ENCOUNTER — Telehealth: Payer: Self-pay | Admitting: Orthopaedic Surgery

## 2021-08-19 DIAGNOSIS — G5621 Lesion of ulnar nerve, right upper limb: Secondary | ICD-10-CM | POA: Insufficient documentation

## 2021-08-19 NOTE — Telephone Encounter (Signed)
Responded to pt's mychart message with this

## 2021-08-19 NOTE — Telephone Encounter (Signed)
Patient called asked if he still need to wear the [facility]? The number to contact patient is [phone]

## 2021-08-19 NOTE — Telephone Encounter (Signed)
Please advise 

## 2021-08-19 NOTE — Progress Notes (Signed)
Post-Op Visit Note   Patient: Christopher Hurst.           Date of Birth: 1965/04/17           MRN: [id number] Visit Date: 08/18/2021 PCP: Leane Call, PA-C   Assessment & Plan: Follow-up total of arthroplasty right.  Left hip is doing well he states the right hip has been more sore than his first hip arthroplasty.  Staples are removed today incision looks good.  No drainage.  He has had some distal thigh pain on the right.  New problem is numbness in his right small and half of his ring finger on the right hand none on the left hand.  He noticed some weakness in grip and has been ambulatory with a cane.  He is not sure when it began but he has noticed that since the surgery.  Exam demonstrates first dorsal interosseous mild atrophy some abductor digiti quinti weakness and positive percussion over the ulnar nerve on the right without ulnar nerve subluxation.  Patient had previous cervical fusion at C4-5 C5-6 which is healed and solid.  The surgery was 2020.  At that time he had mild to moderate foraminal narrowing at C6-7.  Patient has good cervical range of motion minimal brachial plexus tenderness and has good triceps strength.  This appears to be more related to the ulnar nerve at the elbow than the neck.  We will obtain some electrical test due to his pain numbness and weakness.  We discussed cubital tunnel syndrome today as well as the possibility that he may have had some progression of his spondylosis and foraminal stenosis below his solid fusion from his surgery in 2020.  Also follow-up after nerve conduction velocities rule out right cubital tunnel syndrome.  Chief Complaint:  Chief Complaint  Patient presents with   Right Hip - Routine Post Op   Visit Diagnoses:  1. Numbness of right hand   2. S/P total left hip arthroplasty   3. S/P cervical spinal fusion   4. Cubital tunnel syndrome on right     Plan: Staples removed follow-up after 4 weeks.  I discussed the MRI may call  him with electrical test results.  Follow-Up Instructions: No follow-ups on file.   Orders:  Orders Placed This Encounter  Procedures   Ambulatory referral to Physical Medicine Rehab   No orders of the defined types were placed in this encounter.   Imaging: No results found.  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/19/2017   Midline low back pain without sciatica 04/13/2017   Obesity    Essential hypertension    High cholesterol    Gout    Past Medical History:  Diagnosis Date   Acid reflux    Allergies    Anxiety    per pt, no medications per patient   Arthritis    B/L hips   Diabetes mellitus without complication (HCC)    Gout    High cholesterol    Hypertension    Obesity    Pneumonia    as a kid  Family History  Problem Relation Age of Onset   Healthy Mother    Cirrhosis Father    Stroke Father    Alzheimer's disease Father    Alcohol abuse Father    Hypertension Brother     Past Surgical History:  Procedure Laterality Date   ANTERIOR CERVICAL DECOMP/DISCECTOMY FUSION N/A 03/17/2018   Procedure: CERVICAL FOUR-FIVE, CERVCAL FIVE-SIX ANTERIOR CERVICAL DECOMPRESSION/DISCECTOMY FUSION, ALLOGRAFT PLATE;  Surgeon: Eldred Manges, MD;  Location: [facility];  Service: Orthopedics;  Laterality: N/A;   CYSTECTOMY     testicle    KNEE ARTHROSCOPY     left knee x2   SHOULDER ARTHROSCOPY     one left shoulder    TONSILLECTOMY     TOTAL HIP ARTHROPLASTY Left 09/14/2019   Procedure: LEFT TOTAL HIP ARTHROPLASTY -DIRECT ANTERIOR;  Surgeon:  Eldred Manges, MD;  Location: [facility];  Service: Orthopedics;  Laterality: Left;   TOTAL HIP ARTHROPLASTY Right 08/03/2021   Procedure: Right TOTAL HIP ARTHROPLASTY ANTERIOR APPROACH;  Surgeon: Eldred Manges, MD;  Location: [facility];  Service: Orthopedics;  Laterality: Right;   Social History   Occupational History   Not on file  Tobacco Use   Smoking status: Never   Smokeless tobacco: Never  Vaping Use   Vaping Use: Never used  Substance and Sexual Activity   Alcohol use: Yes    Comment: stopped heavily drinking daily in 40s- drinks glass of wine once a year   Drug use: Not Currently    Types: Marijuana    Comment: in high school with friends   Sexual activity: Yes

## 2021-08-28 ENCOUNTER — Encounter: Payer: Self-pay | Admitting: Orthopaedic Surgery

## 2021-09-08 ENCOUNTER — Encounter: Payer: Medicare PPO | Admitting: Physical Medicine and Rehabilitation

## 2021-09-11 NOTE — Discharge Summary (Signed)
Patient ID: Christopher Hurst. MRN: [id number] DOB/AGE: 08-15-1965 56 y.o.  Admit date: 08/03/2021 Discharge date: 08/05/2021  Admission Diagnoses:  Principal Problem:   Arthritis of right hip Active Problems:   Unilateral primary osteoarthritis, right hip   Discharge Diagnoses:  Principal Problem:   Arthritis of right hip Active Problems:   Unilateral primary osteoarthritis, right hip  status post Procedure(s): Right TOTAL HIP ARTHROPLASTY ANTERIOR APPROACH  Past Medical History:  Diagnosis Date   Acid reflux    Allergies    Anxiety    per pt, no medications per patient   Arthritis    B/L hips   Diabetes mellitus without complication (HCC)    Gout    High cholesterol    Hypertension    Obesity    Pneumonia    as a kid    Surgeries: Procedure(s): Right TOTAL HIP ARTHROPLASTY ANTERIOR APPROACH on 08/03/2021   Consultants:   Discharged Condition: Improved  Hospital Course: Christopher Hurst. is an 56 y.o. male who was admitted 08/03/2021 for operative treatment of Arthritis of right hip. Patient failed conservative treatments (please see the history and physical for the specifics) and had severe unremitting pain that affects sleep, daily activities and work/hobbies. After pre-op clearance, the patient was taken to the operating room on 08/03/2021 and underwent  Procedure(s): Right TOTAL HIP ARTHROPLASTY ANTERIOR APPROACH.    Patient was given perioperative antibiotics:  Anti-infectives (From admission, onward)    Start     Dose/Rate Route Frequency Ordered Stop   08/03/21 0600  ceFAZolin (ANCEF) IVPB 2g/100 mL premix        2 g 200 mL/hr over 30 Minutes Intravenous On call to O.R. 08/03/21 [id number] 08/03/21 0818   08/03/21 0556  ceFAZolin (ANCEF) 2-4 GM/100ML-% IVPB       Note to Pharmacy: Gleason, Christopher Hurst: cabinet override      08/03/21 0556 08/03/21 0818        Patient was given sequential compression devices and early ambulation to prevent DVT.   Patient  benefited maximally from hospital stay and there were no complications. At the time of discharge, the patient was urinating/moving their bowels without difficulty, tolerating a regular diet, pain is controlled with oral pain medications and they have been cleared by PT/OT.   Recent vital signs: No data found.   Recent laboratory studies: No results for input(s): "WBC", "HGB", "HCT", "PLT", "NA", "K", "CL", "CO2", "BUN", "CREATININE", "GLUCOSE", "INR", "CALCIUM" in the last 72 hours.  Invalid input(s): "PT", "2"   Discharge Medications:   Allergies as of 08/05/2021   No Known Allergies      Medication List     STOP taking these medications    escitalopram 5 MG tablet Commonly known as: Lexapro       TAKE these medications    allopurinol 100 MG tablet Commonly known as: ZYLOPRIM Take 2 tablets (200 mg total) by mouth daily.   amLODipine 5 MG tablet Commonly known as: NORVASC Take 5 mg by mouth daily.   aspirin EC 325 MG tablet Take 1 tablet (325 mg total) by mouth daily. MUST TAKE AT LEAST 4 WEEKS POSTOP FOR DVT PROPHYLAXIS   atenolol 25 MG tablet Commonly known as: TENORMIN Take 25 mg by mouth in the morning.   atorvastatin 40 MG tablet Commonly known as: LIPITOR Take 1 tablet (40 mg total) by mouth daily.   benazepril 10 MG tablet Commonly known as: LOTENSIN Take 10 mg by mouth daily.   fluticasone 50  MCG/ACT nasal spray Commonly known as: FLONASE Place 2 sprays into both nostrils in the morning.   loratadine 10 MG tablet Commonly known as: CLARITIN Take 10 mg by mouth daily.   metFORMIN 500 MG tablet Commonly known as: GLUCOPHAGE Take 1 tablet by mouth once daily with breakfast   methocarbamol 500 MG tablet Commonly known as: ROBAXIN Take 1 tablet (500 mg total) by mouth every 6 (six) hours as needed for muscle spasms.   omeprazole 20 MG capsule Commonly known as: PRILOSEC Take 20 mg by mouth daily.   oxyCODONE-acetaminophen 7.5-325 MG  tablet Commonly known as: Percocet Take 1 tablet by mouth every 4 (four) hours as needed for severe pain.   True Metrix Blood Glucose Test test strip Generic drug: glucose blood Use as instructed. Check blood glucose levels 2 times per day E11.65   True Metrix Meter w/Device Kit Use as instructed. Check blood glucose levels 2 times per day   TRUEplus Lancets 28G Misc Use as instructed. Check blood glucose level by fingerstick twice per day. E11.65        Diagnostic Studies: No results found.     Follow-up Information     [name], MD. Schedule an appointment as soon as possible for a visit today.   Specialty: Orthopedic Surgery Why: need return office visit with dr yates one week postop Contact information: [place] [id number] [phone]                 Discharge Plan:  discharge to home  Disposition:     Signed: [name]  09/11/2021, 1:57 PM

## 2021-09-12 ENCOUNTER — Encounter: Payer: Self-pay | Admitting: Orthopaedic Surgery

## 2021-09-28 ENCOUNTER — Telehealth: Payer: Self-pay | Admitting: Physical Medicine and Rehabilitation

## 2021-09-28 NOTE — Telephone Encounter (Signed)
Pt called to cancel appt. No call back needed. Pt will call to reschedule when he can.

## 2021-09-28 NOTE — Telephone Encounter (Signed)
Cancelled.  

## 2021-09-30 ENCOUNTER — Encounter: Payer: Medicare PPO | Admitting: Physical Medicine and Rehabilitation

## 2021-10-23 ENCOUNTER — Encounter: Payer: Self-pay | Admitting: Orthopaedic Surgery

## 2021-10-27 NOTE — Telephone Encounter (Signed)
Per Kelsey--patient advised that we will provide taxi for [place] appointment, however, this is the last time we can do this as he is out of his post operative period.

## 2021-10-28 ENCOUNTER — Ambulatory Visit (INDEPENDENT_AMBULATORY_CARE_PROVIDER_SITE_OTHER): Payer: Medicare PPO

## 2021-10-28 ENCOUNTER — Ambulatory Visit (INDEPENDENT_AMBULATORY_CARE_PROVIDER_SITE_OTHER): Payer: Medicare PPO | Admitting: Orthopaedic Surgery

## 2021-10-28 ENCOUNTER — Encounter: Payer: Self-pay | Admitting: Orthopaedic Surgery

## 2021-10-28 VITALS — BP 156/94 | HR 66 | Ht 68.0 in | Wt 250.0 lb

## 2021-10-28 DIAGNOSIS — G8929 Other chronic pain: Secondary | ICD-10-CM

## 2021-10-28 DIAGNOSIS — Z96641 Presence of right artificial hip joint: Secondary | ICD-10-CM | POA: Diagnosis not present

## 2021-10-28 DIAGNOSIS — M545 Low back pain, unspecified: Secondary | ICD-10-CM

## 2021-10-28 NOTE — Progress Notes (Signed)
Post-Op Visit Note   Patient: Christopher Hurst.           Date of Birth: 02/21/1965           MRN: [id number] Visit Date: 10/28/2021 PCP: Maggie Schwalbe, PA-C   Assessment & Plan: Follow-up right total of arthroplasty 08/03/2021.  He still having problems with back pain and is actually ambulating with a weak quad gait on the right side.  Manual testing shows left quad is significantly stronger than right.  His x-rays have shown good position of prosthesis without loosening or subsidence.  His back has been bothering him more and undoubtedly is related to his weak quad gait with hip flexion.  In the exam room he does a hop step on the right leg to get up on a step.  He will work on Forensic scientist we discussed multiple exercises.  Chief Complaint:  Chief Complaint  Patient presents with   Lower Back - Pain   Right Hip - Pain    08/03/2021 Right THA   Visit Diagnoses:  1. Status post right hip replacement   2. Chronic midline low back pain, unspecified whether sciatica present     Plan: Patient was discussed multiple quad exercises particularly on the right which is much weaker than the left.  As he rehabs this using a book bag with some cans of food or a gallon of milk he should get progressive improvement in his walking less limping and improvement in his back symptoms.  He can return in 3 months. Follow-Up Instructions: No follow-ups on file.   Orders:  Orders Placed This Encounter  Procedures   XR HIP UNILAT W OR W/O PELVIS 2-3 VIEWS RIGHT   XR Lumbar Spine 2-3 Views   No orders of the defined types were placed in this encounter.   Imaging: No results found.  PMFS History: Patient Active Problem List   Diagnosis Date Noted   Cubital tunnel syndrome on right 08/19/2021   Arthritis of right hip 08/03/2021   Unilateral primary osteoarthritis, right hip 02/17/2021   S/P total left hip arthroplasty 10/24/2019   Diabetes mellitus without complication ([facility])    S/P  cervical spinal fusion 05/02/2018   Other bilateral secondary osteoarthritis of knee 07/19/2017   Spondylosis of thoracic region without myelopathy or radiculopathy 07/19/2017   Midline low back pain without sciatica 04/13/2017   Obesity    Essential hypertension    High cholesterol    Gout    Past Medical History:  Diagnosis Date   Acid reflux    Allergies    Anxiety    per pt, no medications per patient   Arthritis    B/L hips   Diabetes mellitus without complication (HCC)    Gout    High cholesterol    Hypertension    Obesity    Pneumonia    as a kid    Family History  Problem Relation Age of Onset   Healthy Mother    Cirrhosis Father    Stroke Father    Alzheimer's disease Father    Alcohol abuse Father    Hypertension Brother     Past Surgical History:  Procedure Laterality Date   ANTERIOR CERVICAL DECOMP/DISCECTOMY FUSION N/A 03/17/2018   Procedure: CERVICAL FOUR-FIVE, CERVCAL FIVE-SIX ANTERIOR CERVICAL DECOMPRESSION/DISCECTOMY FUSION, ALLOGRAFT PLATE;  Surgeon: [name], MD;  Location: [facility];  Service: Orthopedics;  Laterality: N/A;   CYSTECTOMY     testicle    KNEE ARTHROSCOPY  left knee x2   SHOULDER ARTHROSCOPY     one left shoulder    TONSILLECTOMY     TOTAL HIP ARTHROPLASTY Left 09/14/2019   Procedure: LEFT TOTAL HIP ARTHROPLASTY -DIRECT ANTERIOR;  Surgeon: [name], MD;  Location: [facility];  Service: Orthopedics;  Laterality: Left;   TOTAL HIP ARTHROPLASTY Right 08/03/2021   Procedure: Right TOTAL HIP ARTHROPLASTY ANTERIOR APPROACH;  Surgeon: [name], MD;  Location: [facility];  Service: Orthopedics;  Laterality: Right;   Social History   Occupational History   Not on file  Tobacco Use   Smoking status: Never   Smokeless tobacco: Never  Vaping Use   Vaping Use: Never used  Substance and Sexual Activity   Alcohol use: Yes    Comment: stopped heavily drinking daily in 40s- drinks glass of wine once a year   Drug use: Not Currently     Types: Marijuana    Comment: in high school with friends   Sexual activity: Yes

## 2022-01-27 ENCOUNTER — Ambulatory Visit: Payer: Medicare PPO | Admitting: Orthopaedic Surgery
